# Patient Record
Sex: Male | Born: 1950 | Race: White | Hispanic: No | Marital: Married | Smoking: Never smoker
Health system: Southern US, Community
[De-identification: ages and names within clinical notes are randomized; demographics above are authoritative.]

## PROBLEM LIST (undated history)

## (undated) DIAGNOSIS — M1990 Unspecified osteoarthritis, unspecified site: Secondary | ICD-10-CM

## (undated) DIAGNOSIS — N183 Chronic kidney disease, stage 3 unspecified: Secondary | ICD-10-CM

## (undated) DIAGNOSIS — E785 Hyperlipidemia, unspecified: Secondary | ICD-10-CM

## (undated) DIAGNOSIS — K219 Gastro-esophageal reflux disease without esophagitis: Secondary | ICD-10-CM

## (undated) DIAGNOSIS — T7840XA Allergy, unspecified, initial encounter: Secondary | ICD-10-CM

## (undated) DIAGNOSIS — G40909 Epilepsy, unspecified, not intractable, without status epilepticus: Secondary | ICD-10-CM

## (undated) DIAGNOSIS — K222 Esophageal obstruction: Secondary | ICD-10-CM

## (undated) DIAGNOSIS — R569 Unspecified convulsions: Secondary | ICD-10-CM

## (undated) DIAGNOSIS — I10 Essential (primary) hypertension: Secondary | ICD-10-CM

## (undated) HISTORY — PX: TONSILLECTOMY: SHX5217

## (undated) HISTORY — DX: Epilepsy, unspecified, not intractable, without status epilepticus: G40.909

## (undated) HISTORY — DX: Esophageal obstruction: K22.2

## (undated) HISTORY — PX: APPENDECTOMY: SHX54

## (undated) HISTORY — DX: Chronic kidney disease, stage 3 unspecified: N18.30

## (undated) HISTORY — DX: Unspecified osteoarthritis, unspecified site: M19.90

## (undated) HISTORY — DX: Hyperlipidemia, unspecified: E78.5

## (undated) HISTORY — DX: Allergy, unspecified, initial encounter: T78.40XA

## (undated) HISTORY — DX: Essential (primary) hypertension: I10

## (undated) HISTORY — DX: Gastro-esophageal reflux disease without esophagitis: K21.9

## (undated) HISTORY — PX: UPPER GASTROINTESTINAL ENDOSCOPY: SHX188

---

## 1999-02-11 ENCOUNTER — Encounter: Payer: Self-pay | Admitting: Internal Medicine

## 1999-02-11 ENCOUNTER — Ambulatory Visit (HOSPITAL_COMMUNITY): Admission: RE | Admit: 1999-02-11 | Discharge: 1999-02-11 | Payer: Self-pay | Admitting: Internal Medicine

## 2000-03-08 ENCOUNTER — Encounter: Payer: Self-pay | Admitting: Emergency Medicine

## 2000-03-08 ENCOUNTER — Emergency Department (HOSPITAL_COMMUNITY): Admission: EM | Admit: 2000-03-08 | Discharge: 2000-03-08 | Payer: Self-pay | Admitting: Emergency Medicine

## 2002-08-28 ENCOUNTER — Ambulatory Visit (HOSPITAL_COMMUNITY): Admission: RE | Admit: 2002-08-28 | Discharge: 2002-08-28 | Payer: Self-pay | Admitting: Gastroenterology

## 2003-03-12 ENCOUNTER — Encounter: Payer: Self-pay | Admitting: Emergency Medicine

## 2003-03-12 ENCOUNTER — Emergency Department (HOSPITAL_COMMUNITY): Admission: EM | Admit: 2003-03-12 | Discharge: 2003-03-12 | Payer: Self-pay | Admitting: Emergency Medicine

## 2005-08-10 ENCOUNTER — Encounter: Admission: RE | Admit: 2005-08-10 | Discharge: 2005-08-10 | Payer: Self-pay | Admitting: Internal Medicine

## 2006-07-26 ENCOUNTER — Encounter: Admission: RE | Admit: 2006-07-26 | Discharge: 2006-07-26 | Payer: Self-pay | Admitting: Internal Medicine

## 2006-07-31 ENCOUNTER — Inpatient Hospital Stay (HOSPITAL_COMMUNITY): Admission: EM | Admit: 2006-07-31 | Discharge: 2006-08-03 | Payer: Self-pay | Admitting: Emergency Medicine

## 2006-08-01 ENCOUNTER — Encounter (INDEPENDENT_AMBULATORY_CARE_PROVIDER_SITE_OTHER): Payer: Self-pay | Admitting: Specialist

## 2007-05-04 ENCOUNTER — Emergency Department (HOSPITAL_COMMUNITY): Admission: EM | Admit: 2007-05-04 | Discharge: 2007-05-04 | Payer: Self-pay | Admitting: Emergency Medicine

## 2010-10-25 ENCOUNTER — Encounter: Payer: Self-pay | Admitting: Internal Medicine

## 2011-02-20 NOTE — H&P (Signed)
Wesley Mccarthy, Wesley Mccarthy                 ACCOUNT NO.:  [id number]   MEDICAL RECORD NO.:  [id number]          PATIENT TYPE:  EMS   LOCATION:  MAJO                         FACILITY:  [facility]   PHYSICIAN:  Jonna L. Robb Matar, M.D.DATE OF BIRTH:  1951/07/07   DATE OF ADMISSION:  07/31/2006  DATE OF DISCHARGE:                                HISTORY & PHYSICAL   PRIMARY CARE PHYSICIAN:  Dr. Kellie Shropshire.   CHIEF COMPLAINT:  Weakness.   HISTORY:  Two weeks ago, this African American gentleman came down with some  kind of a virus.  He kept feeling worse and worse.  He was seen in the  office last week where he was diagnosed with pneumonia and started on  Levaquin.  He has finished up the antibiotic but he is just feeling more  weak and washed out.  His appetite has not been good and he has not been  drinking fluids all that well.   PAST HISTORY:  1. Epilepsy.  2. Hypertension.  3. Hypercholesterolemia.  4. Gout.  5. He has occasionally been on steroids.   OPERATIONS:  Appendectomy, tonsillectomy.   FAMILY HISTORY:  Bypass surgery, Alzheimer's, and colon cancer.   SOCIAL HISTORY:  Nonsmoker, nondrinker.  Married.  Three kids; one has  allergies, one has back problems.   ALLERGIES:  PENICILLIN AND SHELLFISH.   MEDICATIONS:  Phenobarbital, Diovan, Levaquin, Lipitor, potassium.  There  may be some kind of gout medicine.  There is some purple pill he takes.  He  nor his wife remember the dosages of any of them.   REVIEW OF SYSTEMS:  He has occasional headaches.  Endocrine, cardiology,  pulmonary, GI, and renal, reviewed and are all completely negative.  The  gout bothers him in his fingers, knees, back, and ankles.  He denies any  previous history of heart, lung, or kidney disease.  No hematuria.  No  abdominal pain.   PHYSICAL EXAMINATION:  VITAL SIGNS:  Temperature 97.0, pulse 88,  respirations 18, blood pressure 111/80, 99% on room air.  GENERAL:  A pleasant elderly rather forgetful  African American gentleman.  HEENT:  Pupils are reactive.  Extraocular movements are full.  NECK:  There is no carotid bruit or thyromegaly or jugular venous  distention.  LUNGS:  Clear until you get to the bases where he has a few faint crackles;  there is no wheezing or rales.  HEART:  Regular rate and rhythm.  Normal S1, S2, without murmurs, rubs, or  gallops.  ABDOMEN:  Nontender including over the renal area.  There is no  hepatosplenomegaly.  EXTREMITIES:  No arthritis.  No cyanosis, clubbing, or edema.   LABORATORY WORK:  White count 8.2, hemoglobin 13.6, BUN 122, creatinine 7.7.  Chest x-ray shows resolving pneumonia versus bibasilar atelectasis.   IMPRESSION:  1. Pneumonia.  I do not think he needs any further antibiotics.  2. Acute renal failure versus dehydration.  I will get an ultrasound of      the kidneys.  Start him on some IV fluids.  Consult Nephrology.  3. Epilepsy.  4.  Hypotension.  Medications will be held.  5. Hypercholesterolemia.  6. Gout.  7. For his regular medications, they are all being held pending finding      out what the dosages are. We will get phenobarbital levels, make sure      that is not elevated.      Jonna L. Robb Matar, M.D.  Electronically Signed     JLB/MEDQ  D:  07/31/2006  T:  08/01/2006  Job:  [id number]

## 2011-02-20 NOTE — Discharge Summary (Signed)
NAMEMIDAS, DAUGHETY NO.:  [id number]   MEDICAL RECORD NO.:  [id number]          PATIENT TYPE:  INP   LOCATION:  4709                         FACILITY:  [facility]   PHYSICIAN:  Hillery Aldo, M.D.   DATE OF BIRTH:  1951/08/10   DATE OF ADMISSION:  07/31/2006  DATE OF DISCHARGE:  08/03/2006                                 DISCHARGE SUMMARY   PRIMARY CARE PHYSICIAN:  Dr. Andi Devon.   DISCHARGE DIAGNOSES:  1. Acute renal failure likely secondary to acute tubular necrosis from      volume contraction in the presence of ACE inhibitor and angiotensin      receptor blocker medications and possible AIN due to allergic reaction      to Levaquin.  2. Recent treatment for pneumonia with a 10-day course of Levaquin.  3. Seizure disorder.  4. Hyperlipidemia.  5. Hypertension.  6. Hypokalemia.  7. Gouty arthritis.   DISCHARGE MEDICATIONS:  1. Lipitor 20 mg daily.  2. Phenobarbital 97.2 mg daily.  3  Potassium chloride 20 mEq b.i.d.  1. Hydrocodone 12.5/2/7.5 p.o. q.6 h p.r.n. cough.   Note:  The patient was instructed to hold Diovan HCT, amlodipine/benazepril,  indomethacin ER and colchicine until he follows up with his primary care  physician, Dr. Renae Gloss.   CONSULTATIONS:  1. Dr. Arlean Hopping of Nephrology.   PROCEDURES AND DIAGNOSTIC STUDIES:  1. Chest x-ray on July 31, 2006, showed resolving bibasilar ammonia      with minimal residual atelectasis.  2. Renal ultrasound on July 31, 2006, showed echogenic kidneys that      were normal-sized.  This would be compatible with medical renal      disease.  No hydronephrosis.   DISCHARGE LABORATORY VALUES:  Sodium was 141, potassium 3.2 (repleted prior  to discharge), chloride 109, bicarbonate 23, glucose 109, BUN 51, creatinine  2.8.  Urine culture was negative.   BRIEF ADMISSION HISTORY OF PRESENT ILLNESS:  The patient is a 60 year old  male who was treated with a 10-day course of Levaquin secondary to  pneumonia.  After he finished the antibiotic, he felt weak and had  diminished appetite.  He was somewhat dehydrated and not taking n.p.o.  fluids well.  He presented to the hospital with a chief complaint of  weakness, and upon initial evaluation was found to be an acute renal failure  with a creatinine of 7.7.   HOSPITAL COURSE:  #1 - ACUTE RENAL FAILURE.  The patient has been an initial  creatinine of 7.7 and an initial BUN of 0.22.  Renal consultation was  therefore requested, and it was felt that acute renal failure was secondary  to volume depletion in the setting of angiotensin receptive blocker and ACE  inhibitor medications.  The second possible explanation would be acute  interstitial nephritis secondary to use of quinolones.  Urine was sent for  eosinophils.  However at the time of this dictation, this is not back yet.  Microscopic on his urine did reveal 7-10 white blood cells and 21-50 red  blood cells with no significant bacteria.  His ACE inhibitor and his ARB were both held, and the patient was put on  fluid volume resuscitation.  His renal function has continued to improve  with a creatinine that went from 7.7-5.6 to 3.8 and today is 2.9.  It is  expected that his renal function will return to baseline of the next 24-48  hours.  Given this, he is stable for discharge.  He will follow up with his  primary care physician, Dr. Renae Gloss, for a check of his electrolytes and  renal function on August 05, 2006.   #2 - RECENT HISTORY OF PNEUMONIA.  The patient was treated with 10-day  course of Levaquin.  His follow-up chest x-ray does show resolution.  He has  been afebrile with a normal white blood cell count and no significant cough  or other symptoms related to pneumonia.   #3 - SEIZURE DISORDER.  The patient was maintained on phenobarbital for  seizure control.  He had exhibited no seizure activity while in the  hospital.  Phenobarbital level was checked during course of  his  hospitalization and was found to be in the therapeutic range at 19.2.   #4 - HYPERLIPIDEMIA.  The patient was maintained on Statin therapy.  This  was restarted during the course of his hospitalization, and he is instructed  to continue this at discharge.   #5 - HISTORY OF GOUT.  The patient did not have any gout flares during this  hospitalization.  Nevertheless, it is recommended he discontinue colchicine  and indomethacin at this time.  When his renal function returns normal, he  can restart these medications.   #6 - HYPERTENSION.  The patient's hypertension has been controlled even  though he has not been taking any antihypertensive medication.  Again, he is  instructed to hold his antihypertensives until he follows up with Dr.  Renae Gloss and has a recheck of his renal function.   #7 - HYPOKALEMIA.  The patient was hypokalemic during the course of  hospitalization.  He was repleted with potassium and will be instructed to  resume his normal potassium supplementation doses of discharge.   DISPOSITION:  The patient is discharged home.  He has a follow-up  appointment scheduled with Dr. Renae Gloss on August 05, 2006 at the 11:00 a.m.  At that time, his renal function will be rechecked as well as his  electrolytes.      Hillery Aldo, M.D.  Electronically Signed     CR/MEDQ  D:  08/03/2006  T:  08/03/2006  Job:  [id number]   cc:   Merlene Laughter. Renae Gloss, M.D.

## 2011-02-20 NOTE — Op Note (Signed)
   Wesley Mccarthy, Wesley Mccarthy                             ACCOUNT NO.:  [id number]   MEDICAL RECORD NO.:  [id number]                   PATIENT TYPE:  AMB   LOCATION:  ENDO                                 FACILITY:  [facility]   PHYSICIAN:  Anselmo Rod, M.D.               DATE OF BIRTH:  17-May-1951   DATE OF PROCEDURE:  08/28/2002  DATE OF DISCHARGE:                                 OPERATIVE REPORT   PROCEDURE:  Colonoscopy.   ENDOSCOPIST:  Anselmo Rod, M.D.   INSTRUMENT USED:  Olympus video colonoscope.   INDICATION FOR PROCEDURE:  A 60 year old African-American male with a  history of blood in stool and a family history of colon cancer in his  father.  Rule out colonic polyps, masses, hemorrhoids, etc.   PREPROCEDURE PREPARATION:  Informed consent was procured from the patient.  The patient was fasted for eight hours prior to the procedure and prepped  with a bottle of magnesium citrate and a gallon of NuLytely the night prior  to the procedure.   PREPROCEDURE PHYSICAL:  VITAL SIGNS:  The patient had stable vital signs.  NECK:  Supple.  CHEST:  Clear to auscultation.  S1, S2 regular.  ABDOMEN:  Soft with normal bowel sounds.   DESCRIPTION OF PROCEDURE:  The patient was placed in the left lateral  decubitus position and sedated with 70 mg of Demerol and 7 mg of Versed  intravenously.  Once the patient was adequately sedate and maintained on low-  flow oxygen and continuous cardiac monitoring, the Olympus video colonoscope  was advanced from the rectum to the cecum without difficulty.  The patient  had an excellent prep.  No masses, polyps, erosions, ulcerations, or  diverticula were seen.  Small internal hemorrhoids were appreciated on  retroflexion.  The entire colonic mucosa up to the cecum appeared normal.  The appendiceal orifice and the ileocecal valve were clearly visualized and  photographed.   IMPRESSION:  Normal colonoscopy up to the cecum except for small,  nonbleeding  internal hemorrhoids.   RECOMMENDATIONS:  1. Repeat guaiac testing on an outpatient basis.  2.     Repeat colorectal cancer screening in the next five years unless the patient     develops any abnormal symptoms in the interim.  3. A high-fiber diet.                                               Anselmo Rod, M.D.    JNM/MEDQ  D:  08/28/2002  T:  08/29/2002  Job:  [id number]   cc:   Merlene Laughter. Renae Gloss, M.D.  [phone] [address]  [place]  [place] [address]  Fax: [phone]

## 2011-02-20 NOTE — Consult Note (Signed)
NAMEBROOKES, Wesley Mccarthy NO.:  [id number]   MEDICAL RECORD NO.:  [id number]          PATIENT TYPE:  INP   LOCATION:  4709                         FACILITY:  [facility]   PHYSICIAN:  Maree Krabbe, M.D.DATE OF BIRTH:  10/04/1951   DATE OF CONSULTATION:  07/31/2006  DATE OF DISCHARGE:                                   CONSULTATION   HISTORY:  The patient is a 60 year old African American male with  longstanding controlled epilepsy since childhood, hypertension, and  otherwise healthy.  He had a 1-week upper respiratory illness with bibasilar  pulmonary infiltrates on chest x-ray, treated as pneumonia with antibiotics  which was Levaquin.  He also felt he might have had the flu.  He had fevers,  myalgias, cough, and anorexia.  He eventually improved but then was found to  have significant lab abnormalities, with a BUN of 122 and creatinine of 7.7,  and he was admitted on July 31, 2006 with these problems.  He has noticed  a drop in the urine output but otherwise no previous problems with  urination.  No voiding difficulty, no symptoms of prostatism.  No use of  over-the-counter analgesics, NSAIDs.  He does take an angiotensin receptor  blocker which is Diovan.   PAST MEDICAL HISTORY:  1. Hypertension.  2. Epilepsy.  3. Hyperlipidemia.  4. Gout.   SOCIAL HISTORY:  Married.  Three children.   MEDICATIONS:  Phenobarbital, Diovan, Levaquin, Lipitor, and potassium.   SOCIAL HISTORY:  No alcohol or tobacco use.   REVIEW OF SYSTEMS:  GENERAL:  Denies fevers, chills, sweats.  ENT:  No  hearing loss, visual change.  CARDIORESPIRATORY:  No chest pain, shortness  of breath, or cough.  These have resolved.  GI:  Currently eating  marginally, but eating.  No abdominal pain or diarrhea. GU:  No difficulty  voiding; otherwise, as above.  MUSCULOSKELETAL:  No myalgias or arthralgias.  NEUROLOGIC:  No focal numbness or weakness.   PHYSICAL EXAMINATION:  VITAL SIGNS:  Blood  pressure 120/86, pulse 70,  respirations 18, temperature 97.9.  SKIN:  No rash.  HEENT:  Extraocular muscles intact.  Throat is clear.  NECK:  Supple without JVD.  CHEST:  Clear throughout.  CARDIAC:  Regular rate and rhythm without murmurs, rubs, or gallops.  ABDOMEN:  Soft, nontender.  Active bowel sounds.  EXTREMITIES:  No peripheral edema.  NEUROLOGIC:  Alert and oriented x3.   LABORATORY DATA:  White blood count 8000, hemoglobin 13.  BUN 122,  creatinine 7.7, potassium 4.4, calcium 9.6, magnesium 2.5, phosphorous 6.1.  Urinalysis with 11-20 white blood cells, 3-6 red blood cells.   Chest x-ray on July 31, 2006 shows resolution of the previous bibasilar  infiltrates versus atelectasis.   IMPRESSION:  1. Acute renal failure due to volume depletion and angiotensin receptor      blocker effect.  Less likely, interstitial nephritis due to quinolones.  2. Hypertension.  3. Epilepsy.  4. Pyuria.   RECOMMENDATIONS:  1. IV fluids.  2. Hold ARB (Diovan).  3. Urine eosinophils and urine culture.  Maree Krabbe, M.D.  Electronically Signed     RDS/MEDQ  D:  08/01/2006  T:  08/02/2006  Job:  [id number]

## 2011-08-11 ENCOUNTER — Emergency Department (HOSPITAL_COMMUNITY)
Admission: EM | Admit: 2011-08-11 | Discharge: 2011-08-12 | Disposition: A | Discharge status: Home / Self Care | Payer: No Typology Code available for payment source | Attending: Emergency Medicine | Admitting: Emergency Medicine

## 2011-08-11 ENCOUNTER — Encounter: Payer: Self-pay | Admitting: *Deleted

## 2011-08-11 DIAGNOSIS — Z79899 Other long term (current) drug therapy: Secondary | ICD-10-CM | POA: Insufficient documentation

## 2011-08-11 DIAGNOSIS — M542 Cervicalgia: Secondary | ICD-10-CM | POA: Insufficient documentation

## 2011-08-11 DIAGNOSIS — Z7982 Long term (current) use of aspirin: Secondary | ICD-10-CM | POA: Insufficient documentation

## 2011-08-11 DIAGNOSIS — S139XXA Sprain of joints and ligaments of unspecified parts of neck, initial encounter: Secondary | ICD-10-CM | POA: Insufficient documentation

## 2011-08-11 DIAGNOSIS — S161XXA Strain of muscle, fascia and tendon at neck level, initial encounter: Secondary | ICD-10-CM

## 2011-08-11 HISTORY — DX: Unspecified convulsions: R56.9
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
## 2011-08-12 ENCOUNTER — Encounter (HOSPITAL_COMMUNITY): Payer: Self-pay | Admitting: Emergency Medicine

## 2011-08-12 ENCOUNTER — Emergency Department (HOSPITAL_COMMUNITY): Payer: No Typology Code available for payment source

## 2011-08-12 MED ORDER — IBUPROFEN 800 MG PO TABS
800.0000 mg | ORAL_TABLET | Freq: Once | ORAL | Status: AC
  Administered 2011-08-12: 800 mg via ORAL
  Filled 2011-08-12: qty 1

## 2011-08-12 MED ORDER — NAPROXEN 500 MG PO TABS: 500.0000 mg | ORAL_TABLET | Freq: Two times a day (BID) | ORAL | Status: DC

## 2011-08-12 MED ORDER — OXYCODONE-ACETAMINOPHEN 5-325 MG PO TABS
2.0000 | ORAL_TABLET | Freq: Once | ORAL | Status: AC
  Administered 2011-08-12: 2 via ORAL
  Filled 2011-08-12: qty 2

## 2011-08-12 MED ORDER — HYDROCODONE-ACETAMINOPHEN 5-500 MG PO TABS: 1.0000 | ORAL_TABLET | Freq: Four times a day (QID) | ORAL | Status: AC | PRN

## 2011-08-12 MED ORDER — CYCLOBENZAPRINE HCL 10 MG PO TABS: 10.0000 mg | ORAL_TABLET | Freq: Two times a day (BID) | ORAL | Status: AC | PRN
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
## 2011-08-13 ENCOUNTER — Encounter (HOSPITAL_COMMUNITY): Payer: Self-pay | Admitting: Emergency Medicine

## 2011-11-02 ENCOUNTER — Ambulatory Visit (INDEPENDENT_AMBULATORY_CARE_PROVIDER_SITE_OTHER): Payer: Managed Care, Other (non HMO)

## 2011-11-02 DIAGNOSIS — R569 Unspecified convulsions: Secondary | ICD-10-CM

## 2011-11-02 DIAGNOSIS — I10 Essential (primary) hypertension: Secondary | ICD-10-CM

## 2011-11-02 DIAGNOSIS — J019 Acute sinusitis, unspecified: Secondary | ICD-10-CM

## 2011-11-09 ENCOUNTER — Other Ambulatory Visit: Payer: Self-pay | Admitting: Internal Medicine

## 2011-11-09 MED ORDER — PHENOBARBITAL 97.2 MG PO TABS: 97.2000 mg | ORAL_TABLET | Freq: Every day | ORAL | Status: DC

## 2011-11-11 ENCOUNTER — Encounter: Payer: Self-pay | Admitting: Family Medicine

## 2011-11-11 ENCOUNTER — Ambulatory Visit (INDEPENDENT_AMBULATORY_CARE_PROVIDER_SITE_OTHER): Payer: Managed Care, Other (non HMO) | Admitting: Family Medicine

## 2011-11-11 DIAGNOSIS — E669 Obesity, unspecified: Secondary | ICD-10-CM

## 2011-11-11 DIAGNOSIS — E785 Hyperlipidemia, unspecified: Secondary | ICD-10-CM | POA: Insufficient documentation

## 2011-11-11 DIAGNOSIS — M1A9XX0 Chronic gout, unspecified, without tophus (tophi): Secondary | ICD-10-CM | POA: Insufficient documentation

## 2011-11-11 DIAGNOSIS — I152 Hypertension secondary to endocrine disorders: Secondary | ICD-10-CM | POA: Insufficient documentation

## 2011-11-11 DIAGNOSIS — Z0000 Encounter for general adult medical examination without abnormal findings: Secondary | ICD-10-CM | POA: Insufficient documentation

## 2011-11-11 DIAGNOSIS — I10 Essential (primary) hypertension: Secondary | ICD-10-CM | POA: Insufficient documentation

## 2011-11-11 MED ORDER — PRAVASTATIN SODIUM 40 MG PO TABS: 40.0000 mg | ORAL_TABLET | Freq: Every day | ORAL | Status: DC

## 2011-11-11 MED ORDER — ALLOPURINOL 300 MG PO TABS: 300.0000 mg | ORAL_TABLET | Freq: Every day | ORAL | Status: DC

## 2011-11-11 MED ORDER — PHENOBARBITAL 97.2 MG PO TABS: 97.2000 mg | ORAL_TABLET | Freq: Every day | ORAL | Status: DC

## 2011-11-11 MED ORDER — NAPROXEN 500 MG PO TABS: 500.0000 mg | ORAL_TABLET | Freq: Two times a day (BID) | ORAL | Status: DC

## 2011-11-11 MED ORDER — LISINOPRIL 40 MG PO TABS: 40.0000 mg | ORAL_TABLET | Freq: Every day | ORAL | Status: DC

## 2011-11-11 MED ORDER — POTASSIUM CHLORIDE 20 MEQ PO PACK: 20.0000 meq | PACK | Freq: Two times a day (BID) | ORAL | Status: DC

## 2011-11-11 MED ORDER — HYDROCHLOROTHIAZIDE 12.5 MG PO TABS: 12.5000 mg | ORAL_TABLET | Freq: Every day | ORAL | Status: DC

## 2011-11-11 NOTE — Patient Instructions (Signed)
Obesity Obesity is defined as having a body mass index (BMI) of 30 or more. To calculate your BMI divide your weight in pounds by your height in inches squared and multiply that product by 703. Major illnesses resulting from long-term obesity include:  Stroke.   Heart disease.   Diabetes.   Many cancers.   Arthritis.  Obesity also complicates recovery from many other medical problems.  CAUSES   A history of obesity in your parents.   Thyroid hormone imbalance.   Environmental factors such as excess calorie intake and physical inactivity.  TREATMENT  A healthy weight loss program includes:  A calorie restricted diet based on individual calorie needs.   Increased physical activity (exercise).  An exercise program is just as important as the right low-calorie diet.  Weight-loss medicines should be used only under the supervision of your physician. These medicines help, but only if they are used with diet and exercise programs. Medicines can have side effects including nervousness, nausea, abdominal pain, diarrhea, headache, drowsiness, and depression.  An unhealthy weight loss program includes:  Fasting.   Fad diets.   Supplements and drugs.  These choices do not succeed in long-term weight control.  HOME CARE INSTRUCTIONS  To help you make the needed dietary changes:   Exercise and perform physical activity as directed by your caregiver.   Keep a daily record of everything you eat. There are many free websites to help you with this. It may be helpful to measure your foods so you can determine if you are eating the correct portion sizes.   Use low-calorie cookbooks or take special cooking classes.   Avoid alcohol. Drink more water and drinks with no calories.   Take vitamins and supplements only as recommended by your caregiver.   Weight loss support groups, Registered Dieticians, counselors, and stress reduction education can also be very helpful.  Document Released:  10/29/2004 Document Revised: 06/03/2011 Document Reviewed: 08/28/2007 [employer] Patient Information 2012 [employer]. 

## 2011-11-11 NOTE — Progress Notes (Signed)
  Subjective:    Patient ID: Wesley Mccarthy, male    DOB: 11-Aug-1951, 61 y.o.   MRN: [id number]  HPI  This 61 y.o. AA male is here for HTN follow-up. He states compliance with medications; However, he lost the RXs given to him last week by another provider at [facility]. He will finish the antibiotic   today or tomorrow. He is requesting RFs on all chronic meds.    Review of Systems  Constitutional: Negative.   Eyes: Negative for visual disturbance.  Respiratory: Negative for cough, chest tightness and shortness of breath.   Cardiovascular: Negative.   Neurological: Negative.        Objective:   Physical Exam  Vitals reviewed. Constitutional: He is oriented to person, place, and time. He appears well-developed and well-nourished. No distress.  HENT:  Head: Normocephalic and atraumatic.  Cardiovascular: Normal rate and regular rhythm.   Pulmonary/Chest: Effort normal.  Neurological: He is alert and oriented to person, place, and time. No cranial nerve deficit.  Psychiatric: He has a normal mood and affect.           Assessment & Plan:   1. HTN (hypertension)  Controlled; cont. current medications. Encouraged healthier diet and regular exercise to promote weight reduction.  2. Hyperlipemia  Schedule to RTC for fasting lipid profile and ALT.Cont. Pravastatin.  3. Gout  Continue Allopurinol. Observe dietary restrictions.  4. Obesity  Weight loss goal- 20 lbs over next 4 months.

## 2012-02-02 ENCOUNTER — Ambulatory Visit: Payer: Managed Care, Other (non HMO)

## 2012-02-02 ENCOUNTER — Ambulatory Visit (INDEPENDENT_AMBULATORY_CARE_PROVIDER_SITE_OTHER): Payer: Managed Care, Other (non HMO) | Admitting: Family Medicine

## 2012-02-02 VITALS — BP 145/80 | HR 71 | Temp 98.3°F | Resp 16 | Ht 69.0 in | Wt 219.0 lb

## 2012-02-02 DIAGNOSIS — M79609 Pain in unspecified limb: Secondary | ICD-10-CM

## 2012-02-02 DIAGNOSIS — M79643 Pain in unspecified hand: Secondary | ICD-10-CM

## 2012-02-02 MED ORDER — DOXYCYCLINE HYCLATE 100 MG PO TABS: 100.0000 mg | ORAL_TABLET | Freq: Two times a day (BID) | ORAL | Status: AC

## 2012-02-02 NOTE — Progress Notes (Signed)
  Subjective:    Patient ID: Wesley Mccarthy, male    DOB: 17-Mar-1951, 61 y.o.   MRN: [id number]  HPI 61 yo male with concern for foreign body in his hand.  Works night shift at [employer].  Doesn't remember any trouble with hand when he went to work.  Overnight started to feel pain in left at base of thumb.  Noticed splinter like object in hand.  Doesn't remember when splinter went in or what it could be.  Co-worker tried to get it out with needle.  Got something out but he didn't look at it.  Still feels like something could be stuck in there.  Does have thickening/nodular area in palm near that spot, but that has been there.    Review of Systems Negative except as per HPI     Objective:   Physical Exam  Constitutional: He appears well-developed.  Pulmonary/Chest: Effort normal.  Neurological: He is alert.  Skin:       Left hand, between MCP and thenar crease, visible area where needle used to try to extract fb.  No fb clearly visible given blood/bruise from previous attempt.  Not able to appreciate by palpation either.  Just distal, towards fingers, nodular thickening in palm.  Nontender.       [facility] Primary radiology reading by Dr. Georgiana Shore: No foreign body identified    Assessment & Plan:  Hand pain, possible foreign body - antibiotic to prevent infection (doxy given penicillin allergy).  Discussed how body will make sure any remaining splinter works out.  Prefer not to incise and explore given location of palm of hand and near thumb and important structures as well as the thickening and possible cysts.

## 2012-02-04 ENCOUNTER — Ambulatory Visit (INDEPENDENT_AMBULATORY_CARE_PROVIDER_SITE_OTHER): Payer: Managed Care, Other (non HMO) | Admitting: Family Medicine

## 2012-02-04 DIAGNOSIS — J309 Allergic rhinitis, unspecified: Secondary | ICD-10-CM

## 2012-02-04 MED ORDER — LORATADINE 10 MG PO TABS: 10.0000 mg | ORAL_TABLET | Freq: Every day | ORAL | Status: DC

## 2012-02-04 NOTE — Progress Notes (Signed)
  Subjective:    Patient ID: Wesley Mccarthy, male    DOB: [birth year], 61 y.o.   MRN: [id number]  HPI 61 yo male with multiple medical problems, just here 2 days ago for possible foreign body in hand, here today with URI symptoms. Sneezing, runny nose, cough.  Started through the day today.  Hasn't taken anything for it.  No hsitory of allergies.  No fever, sore throat, or ear pain.     Review of Systems Negative except as per HPI     Objective:   Physical Exam  Constitutional: He appears well-developed. No distress.  HENT:  Right Ear: Tympanic membrane, external ear and ear canal normal. Tympanic membrane is not injected, not scarred, not perforated, not erythematous, not retracted and not bulging.  Left Ear: Tympanic membrane, external ear and ear canal normal. Tympanic membrane is not injected, not scarred, not perforated, not erythematous, not retracted and not bulging.  Nose: No mucosal edema or rhinorrhea. Right sinus exhibits no maxillary sinus tenderness and no frontal sinus tenderness. Left sinus exhibits no maxillary sinus tenderness and no frontal sinus tenderness.  Mouth/Throat: Uvula is midline, oropharynx is clear and moist and mucous membranes are normal. No oropharyngeal exudate or tonsillar abscesses.  Cardiovascular: Normal rate, regular rhythm, normal heart sounds and intact distal pulses.   No murmur heard. Pulmonary/Chest: Effort normal and breath sounds normal. No respiratory distress. He has no wheezes. He has no rales.  Lymphadenopathy:       Head (right side): No submandibular and no preauricular adenopathy present.       Head (left side): No submandibular and no preauricular adenopathy present.       Right cervical: No superficial cervical and no posterior cervical adenopathy present.      Left cervical: No superficial cervical and no posterior cervical adenopathy present.       Right: No supraclavicular adenopathy present.       Left: No supraclavicular adenopathy  present.  Skin: Skin is warm and dry.          Assessment & Plan:  Likely allergies - states he has prescription nose spray at home.  Will restart it.  If runs out, can call with name for refill.  Also, start claritin, fluids, rest.

## 2012-02-11 ENCOUNTER — Ambulatory Visit (INDEPENDENT_AMBULATORY_CARE_PROVIDER_SITE_OTHER): Payer: Managed Care, Other (non HMO) | Admitting: Family Medicine

## 2012-02-11 VITALS — BP 145/80 | HR 93 | Temp 98.6°F | Resp 16 | Ht 68.5 in | Wt 221.0 lb

## 2012-02-11 DIAGNOSIS — R058 Other specified cough: Secondary | ICD-10-CM

## 2012-02-11 DIAGNOSIS — R059 Cough, unspecified: Secondary | ICD-10-CM

## 2012-02-11 DIAGNOSIS — J302 Other seasonal allergic rhinitis: Secondary | ICD-10-CM

## 2012-02-11 DIAGNOSIS — J310 Chronic rhinitis: Secondary | ICD-10-CM

## 2012-02-11 DIAGNOSIS — J309 Allergic rhinitis, unspecified: Secondary | ICD-10-CM

## 2012-02-11 DIAGNOSIS — R05 Cough: Secondary | ICD-10-CM

## 2012-02-11 LAB — POCT CBC
Granulocyte percent: 68.4 %G (ref 37–80)
HCT, POC: 39.7 % — AB (ref 43.5–53.7)
Hemoglobin: 12.9 g/dL — AB (ref 14.1–18.1)
MCH, POC: 29.8 pg (ref 27–31.2)
MCV: 91.7 fL (ref 80–97)
RBC: 4.33 M/uL — AB (ref 4.69–6.13)
WBC: 6.4 10*3/uL (ref 4.6–10.2)

## 2012-02-11 MED ORDER — BENZONATATE 100 MG PO CAPS: ORAL_CAPSULE | ORAL | Status: DC

## 2012-02-11 MED ORDER — FLUTICASONE PROPIONATE 50 MCG/ACT NA SUSP: 2.0000 | Freq: Every day | NASAL | Status: DC

## 2012-02-11 MED ORDER — METHYLPREDNISOLONE ACETATE 80 MG/ML IJ SUSP
80.0000 mg | Freq: Once | INTRAMUSCULAR | Status: AC
  Administered 2012-02-11: 80 mg via INTRAMUSCULAR

## 2012-02-11 NOTE — Progress Notes (Signed)
Subjective: Patient is here with history of having his symptoms continue to persist. He was here twice a little week ago. He was treated with Omnicef and doxycycline. He is continued to have a lot of head congestion and blowing mostly clear mucus from his nose. He has been coughing a lot but not wheezing. Has had some chills at times. He is not and he just does not feel good though he has been able to continue to keep working.   Objective: Target looking man who just appears not Fiorinal well. His TMs are normal. Nose looks clear. Throat clear without any erythema. Neck supple without significant nodes. Chest is clear to auscultation. Heart regular without murmurs.   Assessment: URI Cough Possible allergies  Plan check a CBC and labs on him and then decide where we proceed.  Results for orders placed in visit on 02/11/12  POCT CBC      Component Value Range   WBC 6.4  4.6 - 10.2 (K/uL)   Lymph, poc 1.1  0.6 - 3.4    POC LYMPH PERCENT 17.6  10 - 50 (%L)   MID (cbc) 0.9  0 - 0.9    POC MID % 14.0 (*) 0 - 12 (%M)   POC Granulocyte 4.4  2 - 6.9    Granulocyte percent 68.4  37 - 80 (%G)   RBC 4.33 (*) 4.69 - 6.13 (M/uL)   Hemoglobin 12.9 (*) 14.1 - 18.1 (g/dL)   HCT, POC 16.1 (*) 09.6 - 53.7 (%)   MCV 91.7  80 - 97 (fL)   MCH, POC 29.8  27 - 31.2 (pg)   MCHC 32.5  31.8 - 35.4 (g/dL)   RDW, POC 04.5     Platelet Count, POC 249  142 - 424 (K/uL)   MPV 7.3  0 - 99.8 (fL)  GLUCOSE, POCT (MANUAL RESULT ENTRY)      Component Value Range   POC Glucose 96     I do not think this is an active infectious process at this time. We'll go ahead and give her a shot of steroids and treat with nasal steroids and cough medication.

## 2012-02-11 NOTE — Patient Instructions (Addendum)
If you have any of the antibiotics left, please finish the course of them.  Use the nose spray, Flonase, 2 sprays each nostril twice daily for 3 days, then just once daily. Continue until you're doing a lot better.  Take a cough pills 3 times a day as needed. If you are still having trouble with coughing at nighttime takes some Delsym which you can buy without a prescription at bedtime  Continue taking the Claritin daily

## 2012-02-17 ENCOUNTER — Ambulatory Visit: Payer: Managed Care, Other (non HMO)

## 2012-02-17 ENCOUNTER — Ambulatory Visit (INDEPENDENT_AMBULATORY_CARE_PROVIDER_SITE_OTHER): Payer: Managed Care, Other (non HMO) | Admitting: Family Medicine

## 2012-02-17 VITALS — BP 143/86 | HR 69 | Temp 98.1°F | Resp 16 | Ht 68.5 in | Wt 211.4 lb

## 2012-02-17 DIAGNOSIS — R899 Unspecified abnormal finding in specimens from other organs, systems and tissues: Secondary | ICD-10-CM

## 2012-02-17 DIAGNOSIS — G40909 Epilepsy, unspecified, not intractable, without status epilepticus: Secondary | ICD-10-CM

## 2012-02-17 DIAGNOSIS — R05 Cough: Secondary | ICD-10-CM

## 2012-02-17 DIAGNOSIS — R5383 Other fatigue: Secondary | ICD-10-CM

## 2012-02-17 DIAGNOSIS — R5381 Other malaise: Secondary | ICD-10-CM

## 2012-02-17 LAB — COMPREHENSIVE METABOLIC PANEL
ALT: 23 U/L (ref 0–53)
Albumin: 3.8 g/dL (ref 3.5–5.2)
Alkaline Phosphatase: 151 U/L — ABNORMAL HIGH (ref 39–117)
CO2: 28 mEq/L (ref 19–32)
Glucose, Bld: 94 mg/dL (ref 70–99)
Potassium: 3.5 mEq/L (ref 3.5–5.3)
Sodium: 140 mEq/L (ref 135–145)
Total Protein: 6.8 g/dL (ref 6.0–8.3)

## 2012-02-17 LAB — POCT CBC
Granulocyte percent: 53.2 %G (ref 37–80)
HCT, POC: 41 % — AB (ref 43.5–53.7)
Lymph, poc: 1.7 (ref 0.6–3.4)
MCV: 91.5 fL (ref 80–97)
MID (cbc): 0.8 (ref 0–0.9)
POC LYMPH PERCENT: 31.7 %L (ref 10–50)
POC MID %: 15.1 %M — AB (ref 0–12)
RBC: 4.48 M/uL — AB (ref 4.69–6.13)
WBC: 5.3 10*3/uL (ref 4.6–10.2)

## 2012-02-17 MED ORDER — BENZONATATE 100 MG PO CAPS: ORAL_CAPSULE | ORAL | Status: AC

## 2012-02-17 NOTE — Progress Notes (Signed)
  Patient Name: Wesley Mccarthy Date of Birth: 1950-11-29 Medical Record Number: [id number] Gender: male Date of Encounter: 02/17/2012  History of Present Illness:  Wesley Mccarthy is a 61 y.o. very pleasant male patient who presents with the following:  Here with persistent cough. See last 2 OV 5/9 and 02/04/12- so far he has been treated with omnicef and depo- medrol. Cough is dry.  He feels "like I don't have any energy."  No ST, no runny nose.   Not coughing up blood.  Cough is not keeping him awake at night.   No fever or chills anymore.   No GI symptoms.   Otherwise he is doing ok and denies anything else bothering him- he looks sad today but says he is just tired out.    Patient Active Problem List  Diagnoses  . HTN (hypertension)  . Hyperlipemia  . Gout  . Obesity  . Health care maintenance   Past Medical History  Diagnosis Date  . Seizures    No past surgical history on file. History  Substance Use Topics  . Smoking status: Never Smoker   . Smokeless tobacco: Not on file  . Alcohol Use: No   Family History  Problem Relation Age of Onset  . Heart disease Mother     Had a CABG   Allergies  Allergen Reactions  . 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  Marland Kitchen Penicillins     Medication list has been reviewed and updated.  Review of Systems: As per HPI- otherwise negative.  Physical Examination: Filed Vitals:   02/17/12 1237  BP: 143/86  Pulse: 69  Temp: 98.1 F (36.7 C)  TempSrc: Oral  Resp: 16  Height: 5' 8.5" (1.74 m)  Weight: 211 lb 6.4 oz (95.89 kg)    Body mass index is 31.68 kg/(m^2).  GEN: WDWN, NAD, Non-toxic, A & O x 3, obese HEENT: Atraumatic, Normocephalic. Neck supple. No masses, No LAD. Tm and oropharynx wnl Ears and Nose: No external deformity. CV: RRR, No M/G/R. No JVD. No thrill. No extra heart sounds. PULM: CTA B, no wheezes, crackles, rhonchi. No retractions. No resp. distress. No accessory muscle  use. ABD: S, NT, ND, +BS. No rebound. No HSM. EXTR: No c/c/e NEURO Normal gait.  PSYCH: flat affect.  Conversant. Not anxious appearing.  Calm demeanor.   [facility] reading (PRIMARY) by  Dr. Patsy Lager.  Poor inspiration.  Enlarged heart.  No definite infiltrate  CHEST - 2 VIEW  Comparison: None.  Findings: Mild cardiomegaly. Lungs are clear. No effusions or acute bony abnormality. Degenerative changes in the thoracic spine.  IMPRESSION: Cardiomegaly. No active disease.   Assessment and Plan: 1. Cough  DG Chest 2 View, POCT CBC  2. Cough with sputum  benzonatate (TESSALON) 100 MG capsule  3. Fatigue  POCT CBC, TSH, Comprehensive metabolic panel, Phenobarbital level, Brain natriuretic peptide   Will await labs as above.  It seems that [name] is mostly bothered by the fact that his cough is not gone yet.  Explained that a cough can last for several weeks after an illness.  Will be in touch with overread of CXR and labs.  He will let me know if he gets worse in the meantime

## 2012-02-18 LAB — PHENOBARBITAL LEVEL: Phenobarbital: 14 ug/mL — ABNORMAL LOW (ref 15.0–40.0)

## 2012-02-18 LAB — BRAIN NATRIURETIC PEPTIDE: Brain Natriuretic Peptide: 21 pg/mL (ref 0.0–100.0)

## 2012-02-19 ENCOUNTER — Telehealth: Payer: Self-pay

## 2012-02-19 ENCOUNTER — Encounter: Payer: Self-pay | Admitting: Family Medicine

## 2012-02-19 NOTE — Progress Notes (Signed)
Addended by: Abbe Amsterdam C on: 02/19/2012 09:19 PM   Modules accepted: Orders

## 2012-02-19 NOTE — Telephone Encounter (Signed)
Called Wesley Mccarthy and gave him the normal results of his chest xray and also BNP/test for CHF. Told him that Dr Patsy Lager plans to call him back later after finishing w/pts to discuss all results further.

## 2012-02-19 NOTE — Telephone Encounter (Signed)
PATIENT CONTINUES TO HAVE A VERY BAD COUGH THAT HAS NOT BEEN HELPED BY THE MEDICINE.  ALSO WOULD LIKE TO KNOW THE LAB RESULTS FROM PREVIOUS VISIT

## 2012-02-19 NOTE — Progress Notes (Signed)
Addended by: Abbe Amsterdam C on: 02/19/2012 09:26 PM   Modules accepted: Orders

## 2012-03-09 ENCOUNTER — Ambulatory Visit: Payer: Managed Care, Other (non HMO) | Admitting: Family Medicine

## 2012-04-21 ENCOUNTER — Other Ambulatory Visit: Payer: Self-pay | Admitting: Family Medicine

## 2012-04-27 ENCOUNTER — Other Ambulatory Visit: Payer: Self-pay | Admitting: Physician Assistant

## 2012-04-27 MED ORDER — PHENOBARBITAL 97.2 MG PO TABS: 97.2000 mg | ORAL_TABLET | Freq: Every day | ORAL | Status: DC

## 2012-05-28 ENCOUNTER — Other Ambulatory Visit: Payer: Self-pay | Admitting: Family Medicine

## 2012-05-29 NOTE — Telephone Encounter (Signed)
Advise patient that he needs lab for additional fills.  Future order was placed by Dr. Patsy Lager in 02/2012.

## 2012-05-30 NOTE — Telephone Encounter (Signed)
Patient notified

## 2012-06-08 ENCOUNTER — Ambulatory Visit: Payer: Managed Care, Other (non HMO)

## 2012-06-08 ENCOUNTER — Ambulatory Visit (INDEPENDENT_AMBULATORY_CARE_PROVIDER_SITE_OTHER): Payer: Managed Care, Other (non HMO) | Admitting: Family Medicine

## 2012-06-08 VITALS — BP 136/78 | HR 60 | Temp 97.9°F | Resp 18 | Ht 68.5 in | Wt 215.2 lb

## 2012-06-08 DIAGNOSIS — M1990 Unspecified osteoarthritis, unspecified site: Secondary | ICD-10-CM

## 2012-06-08 DIAGNOSIS — R5381 Other malaise: Secondary | ICD-10-CM

## 2012-06-08 DIAGNOSIS — R899 Unspecified abnormal finding in specimens from other organs, systems and tissues: Secondary | ICD-10-CM

## 2012-06-08 DIAGNOSIS — I10 Essential (primary) hypertension: Secondary | ICD-10-CM

## 2012-06-08 DIAGNOSIS — R5383 Other fatigue: Secondary | ICD-10-CM

## 2012-06-08 DIAGNOSIS — G40909 Epilepsy, unspecified, not intractable, without status epilepticus: Secondary | ICD-10-CM

## 2012-06-08 DIAGNOSIS — M25469 Effusion, unspecified knee: Secondary | ICD-10-CM

## 2012-06-08 DIAGNOSIS — M109 Gout, unspecified: Secondary | ICD-10-CM

## 2012-06-08 DIAGNOSIS — R6889 Other general symptoms and signs: Secondary | ICD-10-CM

## 2012-06-08 DIAGNOSIS — E785 Hyperlipidemia, unspecified: Secondary | ICD-10-CM

## 2012-06-08 LAB — POCT SEDIMENTATION RATE: POCT SED RATE: 51 mm/hr — AB (ref 0–22)

## 2012-06-08 LAB — HEPATIC FUNCTION PANEL
AST: 25 U/L (ref 0–37)
Albumin: 4.4 g/dL (ref 3.5–5.2)
Bilirubin, Direct: 0.1 mg/dL (ref 0.0–0.3)
Total Bilirubin: 0.4 mg/dL (ref 0.3–1.2)

## 2012-06-08 MED ORDER — LISINOPRIL 40 MG PO TABS: 40.0000 mg | ORAL_TABLET | Freq: Every day | ORAL | Status: DC

## 2012-06-08 MED ORDER — OXAPROZIN 600 MG PO TABS: ORAL_TABLET | ORAL | Status: DC

## 2012-06-08 MED ORDER — PHENOBARBITAL 97.2 MG PO TABS: 97.2000 mg | ORAL_TABLET | Freq: Every day | ORAL | Status: DC

## 2012-06-08 MED ORDER — PRAVASTATIN SODIUM 40 MG PO TABS: 40.0000 mg | ORAL_TABLET | Freq: Every day | ORAL | Status: DC

## 2012-06-08 MED ORDER — HYDROCHLOROTHIAZIDE 12.5 MG PO CAPS: 12.5000 mg | ORAL_CAPSULE | Freq: Once | ORAL | Status: DC

## 2012-06-08 MED ORDER — POTASSIUM CHLORIDE CRYS ER 20 MEQ PO TBCR: 20.0000 meq | EXTENDED_RELEASE_TABLET | Freq: Two times a day (BID) | ORAL | Status: DC

## 2012-06-08 MED ORDER — ALLOPURINOL 300 MG PO TABS: 300.0000 mg | ORAL_TABLET | Freq: Every day | ORAL | Status: DC

## 2012-06-08 NOTE — Progress Notes (Signed)
Subjective: Patient has a history of gout in the past. He has had his knee swell up gout. He is having the pain for the past month, but last night it was hurting particularly bad. Today he was not able to continue out of work because of the knee pain  Also needs some lab testing done before his other meds to be prescribed apparently.  Objective: Left knee is tender mildly around the joint line and in the popliteal fossa. No effusion is present today. [facility] reading (PRIMARY) by  Dr. Alwyn Ren Degenerative changes and mild medial narrowing  Assessment: Knee pain Degenerative joint disease History of gout HTN Hyperlipidemia . Plan: Prescribe his medications. Prescribed nsaid

## 2012-06-08 NOTE — Patient Instructions (Signed)
Take your medicines as per your previous routine  Take Daypro one twice daily for the knee. If it is not doing better we will inject it.  Return in

## 2012-06-09 ENCOUNTER — Encounter: Payer: Self-pay | Admitting: Family Medicine

## 2012-06-09 LAB — COMPREHENSIVE METABOLIC PANEL
ALT: 17 U/L (ref 0–53)
AST: 26 U/L (ref 0–37)
Albumin: 4.5 g/dL (ref 3.5–5.2)
CO2: 29 mEq/L (ref 19–32)
Calcium: 9.8 mg/dL (ref 8.4–10.5)
Chloride: 100 mEq/L (ref 96–112)
Creat: 1.27 mg/dL (ref 0.50–1.35)
Potassium: 3.4 mEq/L — ABNORMAL LOW (ref 3.5–5.3)
Total Protein: 7.4 g/dL (ref 6.0–8.3)

## 2012-06-09 LAB — LIPID PANEL
Cholesterol: 182 mg/dL (ref 0–200)
Total CHOL/HDL Ratio: 5.7 Ratio

## 2012-06-09 LAB — URIC ACID: Uric Acid, Serum: 5.7 mg/dL (ref 4.0–7.8)

## 2012-06-10 ENCOUNTER — Encounter: Payer: Self-pay | Admitting: Family Medicine

## 2012-08-23 ENCOUNTER — Ambulatory Visit (INDEPENDENT_AMBULATORY_CARE_PROVIDER_SITE_OTHER): Payer: Managed Care, Other (non HMO) | Admitting: Emergency Medicine

## 2012-08-23 VITALS — BP 127/75 | HR 100 | Temp 99.7°F | Resp 18

## 2012-08-23 DIAGNOSIS — J018 Other acute sinusitis: Secondary | ICD-10-CM

## 2012-08-23 MED ORDER — CEFPROZIL 500 MG PO TABS: 500.0000 mg | ORAL_TABLET | Freq: Two times a day (BID) | ORAL | Status: AC

## 2012-08-23 MED ORDER — NAPROXEN SODIUM 550 MG PO TABS: 550.0000 mg | ORAL_TABLET | Freq: Two times a day (BID) | ORAL | Status: DC

## 2012-08-23 MED ORDER — PSEUDOEPHEDRINE-GUAIFENESIN ER 60-600 MG PO TB12: 1.0000 | ORAL_TABLET | Freq: Two times a day (BID) | ORAL | Status: DC

## 2012-08-23 NOTE — Progress Notes (Signed)
[facility] [address] [place] [address] [phone]  Date:  08/23/2012   Name:  Wesley Mccarthy   DOB:  22-Jan-1951   MRN:  [id number]  PCP:  Tally Due, MD    Chief Complaint: Chills, Fatigue and belching   History of Present Illness:  Wesley Mccarthy is a 61 y.o. very pleasant male patient who presents with the following:  Ill since the weekend with fatigue and malaise.  Has nasal congestion and post nasal drainage.  No cough or sore throat. Some fever to 101 and chills at times.  Feels fatigued and malaise and arthralgias.  No nausea or vomiting.  No stool change. Poor appetite.  Spent Sunday with his cousin who was ill as well with a sinus infection/allergies.  No improvement with otc meds.  No abdominal pain .  Occasionally belches less than once daily and that is rather foul tasting.  Patient Active Problem List  Diagnosis  . HTN (hypertension)  . Hyperlipemia  . Gout  . Obesity  . Health care maintenance    Past Medical History  Diagnosis Date  . Seizures     Past Surgical History  Procedure Date  . Appendectomy   . Tonsillectomy     History  Substance Use Topics  . Smoking status: Never Smoker   . Smokeless tobacco: Not on file  . Alcohol Use: No    Family History  Problem Relation Age of Onset  . Heart disease Mother     Had a CABG  . Cancer Father     colon cancer    Allergies  Allergen Reactions  . 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  Marland Kitchen Penicillins     Medication list has been reviewed and updated.  Current Outpatient Prescriptions on File Prior to Visit  Medication Sig Dispense Refill  . allopurinol (ZYLOPRIM) 300 MG tablet Take 1 tablet (300 mg total) by mouth daily.  30 tablet  5  . aspirin EC 81 MG tablet Take 81 mg by mouth daily.        . fluticasone (FLONASE) 50 MCG/ACT nasal spray Place 2 sprays into the nose daily.  16 g  1  . hydrochlorothiazide (MICROZIDE)  12.5 MG capsule Take 1 capsule (12.5 mg total) by mouth once.  30 capsule  12  . lisinopril (PRINIVIL,ZESTRIL) 40 MG tablet Take 1 tablet (40 mg total) by mouth daily.  30 tablet  12  . oxaprozin (DAYPRO) 600 MG tablet One twice daily for knee.  Take with food.  30 tablet  1  . PHENobarbital (LUMINAL) 97.2 MG tablet Take 1 tablet (97.2 mg total) by mouth daily.  30 tablet  5  . potassium chloride SA (K-DUR,KLOR-CON) 20 MEQ tablet Take 1 tablet (20 mEq total) by mouth 2 (two) times daily. Needs labs updated for additional refills.  120 tablet  12  . pravastatin (PRAVACHOL) 40 MG tablet Take 1 tablet (40 mg total) by mouth daily. Need labs for additional refills.  30 tablet  12  . loratadine (CLARITIN) 10 MG tablet Take 1 tablet (10 mg total) by mouth daily.  30 tablet  11  . [DISCONTINUED] potassium chloride (KLOR-CON) 20 MEQ packet Take 20 mEq by mouth 2 (two) times daily.  30 packet  5    Review of Systems:  As per HPI, otherwise negative.    Physical Examination: Filed Vitals:   08/23/12 1346  BP: 127/75  Pulse: 100  Temp: 99.7 F (37.6 C)  Resp: 18   There were no vitals filed for this visit. There is no height or weight on file to calculate BMI. Ideal Body Weight:    GEN: WDWN, NAD, Non-toxic, A & O x 3 HEENT: Atraumatic, Normocephalic. Neck supple. No masses, No LAD. Ears and Nose: No external deformity.  Green nasal drainage CV: RRR, No M/G/R. No JVD. No thrill. No extra heart sounds. PULM: CTA B, no wheezes, crackles, rhonchi. No retractions. No resp. distress. No accessory muscle use. ABD: S, NT, ND, +BS. No rebound. No HSM. EXTR: No c/c/e NEURO Normal gait.  PSYCH: Normally interactive. Conversant. Not depressed or anxious appearing.  Calm demeanor.    Assessment and Plan: Sinusitis mucinex cefzil Follow up as needed  Carmelina Dane, MD

## 2012-08-25 ENCOUNTER — Encounter: Payer: Self-pay | Admitting: *Deleted

## 2012-08-26 ENCOUNTER — Ambulatory Visit (INDEPENDENT_AMBULATORY_CARE_PROVIDER_SITE_OTHER): Payer: Managed Care, Other (non HMO) | Admitting: Family Medicine

## 2012-08-26 VITALS — BP 129/68 | HR 94 | Temp 98.1°F | Resp 16 | Ht 70.0 in | Wt 217.0 lb

## 2012-08-26 DIAGNOSIS — Z23 Encounter for immunization: Secondary | ICD-10-CM

## 2012-08-26 DIAGNOSIS — G40909 Epilepsy, unspecified, not intractable, without status epilepticus: Secondary | ICD-10-CM

## 2012-08-26 DIAGNOSIS — K219 Gastro-esophageal reflux disease without esophagitis: Secondary | ICD-10-CM

## 2012-08-26 MED ORDER — PANTOPRAZOLE SODIUM 40 MG PO TBEC: 40.0000 mg | DELAYED_RELEASE_TABLET | Freq: Every day | ORAL | Status: DC

## 2012-08-26 NOTE — Progress Notes (Signed)
Subjective: Patient is complaining of having burps smell like rotten a. His wife is complaining that. He was just treated for a sinusitis, and is doing better from that. Does not describe any particular foods causing his problems.  Objective: Afro-American male in no acute distress. Throat clear. Neck supple without nodes thyromegaly. Chest clear. Heart regular without murmurs. Abdomen is full. Feels like he probably constipated. He denies major constipation problems  . Assessment: Bowel burps, probably secondary GERD.Marland Kitchen From constipation Sinusitis improving  Plan: MiraLax Could not give Reglan because of his seizure history. He probably has GERD and remote Protonix.

## 2012-08-26 NOTE — Patient Instructions (Addendum)
Take miralax one dose daily until bowels are a little loose, then decrease to on an as needed basis.

## 2012-09-04 NOTE — Progress Notes (Signed)
Reviewed and agree.

## 2012-09-12 ENCOUNTER — Ambulatory Visit (INDEPENDENT_AMBULATORY_CARE_PROVIDER_SITE_OTHER): Payer: Managed Care, Other (non HMO) | Admitting: Family Medicine

## 2012-09-12 VITALS — BP 105/69 | HR 84 | Temp 98.6°F | Resp 16 | Ht 70.0 in | Wt 215.0 lb

## 2012-09-12 DIAGNOSIS — K529 Noninfective gastroenteritis and colitis, unspecified: Secondary | ICD-10-CM

## 2012-09-12 DIAGNOSIS — K5289 Other specified noninfective gastroenteritis and colitis: Secondary | ICD-10-CM

## 2012-09-12 MED ORDER — ONDANSETRON 4 MG PO TBDP: 4.0000 mg | ORAL_TABLET | Freq: Three times a day (TID) | ORAL | Status: DC | PRN

## 2012-09-12 NOTE — Patient Instructions (Signed)
Drink plenty of fluids  Avoid rich spicy foods  You probably can return to work tonight if you're feeling better, but if you are worse I will extend the excuse

## 2012-09-12 NOTE — Progress Notes (Signed)
Subjective: 61 year old man who developed vomiting last night. This was about 9:30 in the evening. He vomited at least 3 times. He says the last time he vomited everything seemed to come out. He has not had any more vomiting. He drank a little with his medicine this morning but has not had anything major to eat or drink. He felt feverish and chilled last night and had to bundle up. He did not take his temperature. His wife was a little sick also. He does not believe that it was food poisoning because they had only had some fairly benign-type foods. He had to miss going to work last night. He is feeling little bit better this morning.  Objective: Pleasant gentleman in no distress. No CVA tenderness. Abdomen has normal bowel sounds. Soft without masses or tenderness.  Assessment: Gastroenteritis  Plan:  I do not believe he needs any labs at this time. If he gets worse he is to get back to me. I will caution him that he may get a little diarrhea, this often happens after having had the vomiting.

## 2012-12-08 ENCOUNTER — Ambulatory Visit (INDEPENDENT_AMBULATORY_CARE_PROVIDER_SITE_OTHER): Payer: Managed Care, Other (non HMO) | Admitting: Family Medicine

## 2012-12-08 VITALS — BP 128/82 | HR 70 | Temp 97.8°F | Resp 16 | Ht 67.58 in | Wt 222.0 lb

## 2012-12-08 DIAGNOSIS — I872 Venous insufficiency (chronic) (peripheral): Secondary | ICD-10-CM

## 2012-12-08 DIAGNOSIS — IMO0002 Reserved for concepts with insufficient information to code with codable children: Secondary | ICD-10-CM

## 2012-12-08 DIAGNOSIS — L309 Dermatitis, unspecified: Secondary | ICD-10-CM

## 2012-12-08 DIAGNOSIS — M1710 Unilateral primary osteoarthritis, unspecified knee: Secondary | ICD-10-CM

## 2012-12-08 DIAGNOSIS — M170 Bilateral primary osteoarthritis of knee: Secondary | ICD-10-CM

## 2012-12-08 DIAGNOSIS — L259 Unspecified contact dermatitis, unspecified cause: Secondary | ICD-10-CM

## 2012-12-08 DIAGNOSIS — I8310 Varicose veins of unspecified lower extremity with inflammation: Secondary | ICD-10-CM

## 2012-12-08 MED ORDER — TRIAMCINOLONE ACETONIDE 0.1 % EX LOTN: TOPICAL_LOTION | Freq: Two times a day (BID) | CUTANEOUS | Status: DC

## 2012-12-08 MED ORDER — NAPROXEN SODIUM 550 MG PO TABS: 550.0000 mg | ORAL_TABLET | Freq: Two times a day (BID) | ORAL | Status: DC

## 2012-12-08 MED ORDER — METHYLPREDNISOLONE ACETATE 80 MG/ML IJ SUSP
80.0000 mg | Freq: Once | INTRAMUSCULAR | Status: AC
  Administered 2012-12-08: 80 mg via INTRAMUSCULAR

## 2012-12-08 NOTE — Progress Notes (Signed)
Subjective: Patient has been having a rash on his back and on his legs it is been itching him a lot. He says he is rubbing his back against the wall at a corner all the time at work. He is not aware of his feet swelling a lot.  Objective: There is a patch of eczema on his right scapula. Initially it looked almost like shingles, but it is been there for several weeks and primarily itches and I think it is not shingles. It's eczematoid appearance. He also had stasis dermatitis on his legs. The skin is mildly shiny there is just minimal edema.  Assessment: Stasis dermatitis Eczema Osteoarthritis  Plan: He wanted a refill on his naproxen which I gave him. Advised to eat less salt. Use the triamcinolone lotion on the rash. Return if worse.

## 2012-12-08 NOTE — Patient Instructions (Signed)
Use the lotion on the back and legs on the rash twice daily. Once a rash calms down cut back to just once daily. Return if worse or not improving.  Avoid excessive salt to reduce swelling in the legs. When your just sitting around, try to elevate her feet.

## 2012-12-21 ENCOUNTER — Ambulatory Visit (INDEPENDENT_AMBULATORY_CARE_PROVIDER_SITE_OTHER): Payer: Managed Care, Other (non HMO) | Admitting: Family Medicine

## 2012-12-21 VITALS — BP 122/78 | HR 55 | Temp 97.6°F | Resp 16 | Ht 69.0 in | Wt 226.0 lb

## 2012-12-21 DIAGNOSIS — I872 Venous insufficiency (chronic) (peripheral): Secondary | ICD-10-CM

## 2012-12-21 DIAGNOSIS — I8310 Varicose veins of unspecified lower extremity with inflammation: Secondary | ICD-10-CM

## 2012-12-21 DIAGNOSIS — L259 Unspecified contact dermatitis, unspecified cause: Secondary | ICD-10-CM

## 2012-12-21 DIAGNOSIS — L300 Nummular dermatitis: Secondary | ICD-10-CM

## 2012-12-21 DIAGNOSIS — R609 Edema, unspecified: Secondary | ICD-10-CM

## 2012-12-21 MED ORDER — CLOBETASOL PROP EMOLLIENT BASE 0.05 % EX CREA: 1.0000 "application " | TOPICAL_CREAM | Freq: Two times a day (BID) | CUTANEOUS | Status: DC

## 2012-12-21 MED ORDER — FUROSEMIDE 20 MG PO TABS: 20.0000 mg | ORAL_TABLET | Freq: Every day | ORAL | Status: DC

## 2012-12-21 NOTE — Progress Notes (Signed)
  Subjective:    Patient ID: Wesley Mccarthy, male    DOB: 1951/04/25, 62 y.o.   MRN: [id number]  HPI Pt fell yesterday and injured left knee. He also c/o rash on both legs and his back. Rash has been there for a month or more. He was seen 2 weeks ago here for this. He was given a cream and a shot. He says this helped somewhat, but then he started itching again on his legs.    Review of Systems     Objective:   Physical Exam        Assessment & Plan:

## 2012-12-21 NOTE — Patient Instructions (Signed)
Use the clobetasol cream on the rash on the back. Continue the triamcinolone on the legs.  Discontinue the hydrochlorothiazide for 2 weeks while taking the Lasix 20 mg daily for fluid and swelling.  The knee should take care of itself with time  Return if problems

## 2012-12-21 NOTE — Progress Notes (Signed)
Subjective: Patient is here for recheck of the rash. He continues to have itching of his legs and right upper scapular area. He has been using the triamcinolone cream.  Yesterday he slipped on the porch ice and landed on his left knee.  Objective: Left knee has a contusion at the distal patella and the tibial tuberosity area. These are tender. He has full range of motion. Able to stand on them.  The rash on the right shoulder is not as red as before, but it still is thick splotchy.  The legs have slightly less edema, though this is early in the day, and they do not appear as inflamed. Still are excoriated some.  Assessment: Contusion knee Eczematoid dermatitis upper back Stasis dermatitis of legs  Plan: The knee should be fine with time. No imaging was ordered.  He change the cream for the upper back  to clobetasol  ADD a low-dose diuretic for the legs

## 2013-01-08 ENCOUNTER — Other Ambulatory Visit: Payer: Self-pay | Admitting: Family Medicine

## 2013-01-10 ENCOUNTER — Other Ambulatory Visit: Payer: Self-pay | Admitting: Radiology

## 2013-01-10 DIAGNOSIS — G40909 Epilepsy, unspecified, not intractable, without status epilepticus: Secondary | ICD-10-CM

## 2013-01-10 NOTE — Telephone Encounter (Signed)
rc'd fax from [facility] for phenobarbital please advise. pended

## 2013-01-11 NOTE — Telephone Encounter (Signed)
Forward to Dr. Hopper 

## 2013-01-12 MED ORDER — PHENOBARBITAL 97.2 MG PO TABS: 97.2000 mg | ORAL_TABLET | Freq: Every day | ORAL | Status: DC

## 2013-01-29 ENCOUNTER — Ambulatory Visit (INDEPENDENT_AMBULATORY_CARE_PROVIDER_SITE_OTHER): Payer: Managed Care, Other (non HMO) | Admitting: Family Medicine

## 2013-01-29 VITALS — BP 128/82 | HR 86 | Temp 97.9°F | Resp 18 | Ht 68.0 in | Wt 226.0 lb

## 2013-01-29 DIAGNOSIS — R05 Cough: Secondary | ICD-10-CM

## 2013-01-29 DIAGNOSIS — R0989 Other specified symptoms and signs involving the circulatory and respiratory systems: Secondary | ICD-10-CM

## 2013-01-29 DIAGNOSIS — J302 Other seasonal allergic rhinitis: Secondary | ICD-10-CM

## 2013-01-29 DIAGNOSIS — R059 Cough, unspecified: Secondary | ICD-10-CM

## 2013-01-29 DIAGNOSIS — J309 Allergic rhinitis, unspecified: Secondary | ICD-10-CM

## 2013-01-29 DIAGNOSIS — J329 Chronic sinusitis, unspecified: Secondary | ICD-10-CM

## 2013-01-29 DIAGNOSIS — G40909 Epilepsy, unspecified, not intractable, without status epilepticus: Secondary | ICD-10-CM

## 2013-01-29 MED ORDER — AZITHROMYCIN 250 MG PO TABS: ORAL_TABLET | ORAL | Status: DC

## 2013-01-29 MED ORDER — PHENOBARBITAL 97.2 MG PO TABS: 97.2000 mg | ORAL_TABLET | Freq: Every day | ORAL | Status: DC

## 2013-01-29 MED ORDER — PREDNISONE 20 MG PO TABS: ORAL_TABLET | ORAL | Status: DC

## 2013-01-29 NOTE — Progress Notes (Signed)
62 yo man who developed cough and nasal congestion over the past 24 hours, associated with a  Couple weeks of itchy eyes.  Patient on chronic phenobarbital for seizure disorder and has not had a seizure in years. He was originally on both phenobarbital and Dilantin the Dilantin was discontinued and he's done well on single drug therapy  Objective:  NAD HEENT: pale conjunctiva, nasal passages, otherwise neg Chest:  No rales or ronchi Heart:  Irregular pulse every 3-5 beets Extrem:  No edema, Skin: no rashes.  Ekg:  Multiple PVCs without malignant rhythm  Assessment: Suspect that the Sudafed is causing the pulse irregularity and I have asked the patient to stop taking it.  Patient's been stable on phenobarbital and I agreed to refill this

## 2013-02-01 ENCOUNTER — Ambulatory Visit (INDEPENDENT_AMBULATORY_CARE_PROVIDER_SITE_OTHER): Payer: Managed Care, Other (non HMO) | Admitting: Family Medicine

## 2013-02-01 VITALS — BP 142/82 | HR 84 | Temp 98.5°F | Resp 17 | Ht 68.5 in | Wt 227.0 lb

## 2013-02-01 DIAGNOSIS — R059 Cough, unspecified: Secondary | ICD-10-CM

## 2013-02-01 DIAGNOSIS — R002 Palpitations: Secondary | ICD-10-CM

## 2013-02-01 DIAGNOSIS — R05 Cough: Secondary | ICD-10-CM

## 2013-02-01 MED ORDER — FLUTICASONE PROPIONATE 50 MCG/ACT NA SUSP: 2.0000 | Freq: Every day | NASAL | Status: DC

## 2013-02-01 MED ORDER — BENZONATATE 100 MG PO CAPS: ORAL_CAPSULE | ORAL | Status: DC

## 2013-02-01 NOTE — Progress Notes (Signed)
Subjective: Patient continues to have a lot of coughing and congestion despite the medications he is on. He wondered if he had a shot that couldn't get rid of it quickly. I told him he was arty on the prednisone.  He is not complaining of the PVCs anymore. He stopped taking the decongestant.  Objective: TMs normal. Throat clear. Neck supple without nodes. Chest clear to auscultation. Heart regular.On a long 1 min auscultation i no ectopy was noted.  Assessment: Palpitations, secondary to pseudoephedrine, resolved Bronchitis, possibly allergic  Plan Flonase and Tessalon. Continue the prednisone and Zithromax he was arty on. Return if worse.

## 2013-02-01 NOTE — Patient Instructions (Signed)
Continue to avoid medications that contain pseudoephedrine. This is what is probably causing your skipped heartbeats. Avoid the things with "D" in the cough and cold medicines.  Take Tessalon one pill 3 times daily for the coughing  Use the Flonase (fluticasone) 2 sprays each nostril twice daily for 3 days, then cut back to once daily

## 2013-03-12 ENCOUNTER — Other Ambulatory Visit: Payer: Self-pay | Admitting: Family Medicine

## 2013-03-13 ENCOUNTER — Other Ambulatory Visit: Payer: Self-pay | Admitting: Family Medicine

## 2013-03-14 NOTE — Telephone Encounter (Signed)
Called pt to check status. Pt reports that he is much better, but he still has some cough, esp at night. It is non-productive, no fever or any other Sxs. Dr Alwyn Ren, do you want to give pt another RF, or do you want to recheck him first?

## 2013-03-16 NOTE — Telephone Encounter (Signed)
Refill once 

## 2013-05-10 ENCOUNTER — Other Ambulatory Visit: Payer: Self-pay | Admitting: Physician Assistant

## 2013-05-10 ENCOUNTER — Other Ambulatory Visit: Payer: Self-pay | Admitting: Family Medicine

## 2013-06-14 ENCOUNTER — Other Ambulatory Visit: Payer: Self-pay | Admitting: Family Medicine

## 2013-07-01 ENCOUNTER — Other Ambulatory Visit: Payer: Self-pay | Admitting: Family Medicine

## 2013-07-15 ENCOUNTER — Other Ambulatory Visit: Payer: Self-pay | Admitting: Physician Assistant

## 2013-07-15 ENCOUNTER — Other Ambulatory Visit: Payer: Self-pay | Admitting: Family Medicine

## 2013-07-24 ENCOUNTER — Ambulatory Visit (INDEPENDENT_AMBULATORY_CARE_PROVIDER_SITE_OTHER): Payer: Managed Care, Other (non HMO) | Admitting: Family Medicine

## 2013-07-24 VITALS — BP 136/80 | HR 98 | Temp 98.0°F | Resp 18 | Ht 68.0 in | Wt 233.0 lb

## 2013-07-24 DIAGNOSIS — E876 Hypokalemia: Secondary | ICD-10-CM

## 2013-07-24 DIAGNOSIS — I10 Essential (primary) hypertension: Secondary | ICD-10-CM

## 2013-07-24 DIAGNOSIS — G40909 Epilepsy, unspecified, not intractable, without status epilepticus: Secondary | ICD-10-CM

## 2013-07-24 DIAGNOSIS — D649 Anemia, unspecified: Secondary | ICD-10-CM

## 2013-07-24 DIAGNOSIS — Z23 Encounter for immunization: Secondary | ICD-10-CM

## 2013-07-24 DIAGNOSIS — E785 Hyperlipidemia, unspecified: Secondary | ICD-10-CM

## 2013-07-24 DIAGNOSIS — Z125 Encounter for screening for malignant neoplasm of prostate: Secondary | ICD-10-CM

## 2013-07-24 DIAGNOSIS — M109 Gout, unspecified: Secondary | ICD-10-CM

## 2013-07-24 LAB — POCT CBC
HCT, POC: 42.9 % — AB (ref 43.5–53.7)
Lymph, poc: 1.4 (ref 0.6–3.4)
MCHC: 31 g/dL — AB (ref 31.8–35.4)
MPV: 7 fL (ref 0–99.8)
POC Granulocyte: 2.1 (ref 2–6.9)
POC LYMPH PERCENT: 36.1 %L (ref 10–50)
POC MID %: 11.3 %M (ref 0–12)
RDW, POC: 13.4 %
WBC: 4 10*3/uL — AB (ref 4.6–10.2)

## 2013-07-24 MED ORDER — POTASSIUM CHLORIDE CRYS ER 20 MEQ PO TBCR: 20.0000 meq | EXTENDED_RELEASE_TABLET | Freq: Every day | ORAL | Status: DC

## 2013-07-24 MED ORDER — HYDROCHLOROTHIAZIDE 12.5 MG PO CAPS: 12.5000 mg | ORAL_CAPSULE | ORAL | Status: DC

## 2013-07-24 MED ORDER — PRAVASTATIN SODIUM 40 MG PO TABS: 40.0000 mg | ORAL_TABLET | Freq: Every day | ORAL | Status: DC

## 2013-07-24 MED ORDER — LISINOPRIL 40 MG PO TABS: 40.0000 mg | ORAL_TABLET | Freq: Every day | ORAL | Status: DC

## 2013-07-24 MED ORDER — PHENOBARBITAL 97.2 MG PO TABS: 97.2000 mg | ORAL_TABLET | Freq: Every day | ORAL | Status: DC

## 2013-07-24 MED ORDER — FUROSEMIDE 20 MG PO TABS: 20.0000 mg | ORAL_TABLET | Freq: Every day | ORAL | Status: DC

## 2013-07-24 MED ORDER — ALLOPURINOL 300 MG PO TABS: 300.0000 mg | ORAL_TABLET | Freq: Every day | ORAL | Status: DC

## 2013-07-24 NOTE — Progress Notes (Addendum)
[facility] [address] [place] [address] [phone]  Date:  07/24/2013   Name:  Wesley Mccarthy   DOB:  11-14-1950   MRN:  [id number]  PCP:  Tally Due, MD    Chief Complaint: rx refills and Flu Vaccine   History of Present Illness:  Wesley Mccarthy is a 62 y.o. very pleasant male patient who presents with the following:  He is here today for med refills and flu shot.  Last ate at 5am.  He works overnight as a Futures trader.   He has a history of gout, HTN, seizure disorder and obesity.    Patient Active Problem List   Diagnosis Date Noted  . 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 Stasis dermatitis of both legs 12/21/2012  . Edema 12/21/2012  . Seizure disorder 08/26/2012  . HTN (hypertension) 11/11/2011  . Hyperlipemia 11/11/2011  . Gout 11/11/2011  . Obesity 11/11/2011  . Health care maintenance 11/11/2011    Past Medical History  Diagnosis Date  . Seizures   . Arthritis   . Allergy   . Hypertension     Past Surgical History  Procedure Laterality Date  . Appendectomy    . Tonsillectomy      History  Substance Use Topics  . Smoking status: Never Smoker   . Smokeless tobacco: Not on file  . Alcohol Use: No    Family History  Problem Relation Age of Onset  . Heart disease Mother     Had a CABG  . Cancer Father     colon cancer    Allergies  Allergen Reactions  . 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  Marland Kitchen Penicillins     Medication list has been reviewed and updated.  Current Outpatient Prescriptions on File Prior to Visit  Medication Sig Dispense Refill  . allopurinol (ZYLOPRIM) 300 MG tablet Take 1 tablet (300 mg total) by mouth daily. PATIENT NEEDS OFFICE VISIT FOR ADDITIONAL REFILLS  30 tablet  0  . fluticasone (FLONASE) 50 MCG/ACT nasal spray Place 2 sprays into the nose daily.  16 g  6  . furosemide (LASIX) 20 MG tablet TAKE 1 TABLET (20 MG TOTAL) BY MOUTH DAILY.  30 tablet  0  .  hydrochlorothiazide (MICROZIDE) 12.5 MG capsule Take 1 capsule (12.5 mg total) by mouth every morning. Needs office visit  15 capsule  0  . lisinopril (PRINIVIL,ZESTRIL) 40 MG tablet Take 1 tablet (40 mg total) by mouth daily. PATIENT NEEDS OFFICE VISIT FOR ADDITIONAL REFILLS  30 tablet  0  . loratadine (CLARITIN) 10 MG tablet Take 1 tablet (10 mg total) by mouth daily.  30 tablet  11  . potassium chloride SA (K-DUR,KLOR-CON) 20 MEQ tablet Take 1 tablet (20 mEq total) by mouth daily. Needs office visit  15 tablet  0  . pravastatin (PRAVACHOL) 40 MG tablet Take 1 tablet (40 mg total) by mouth daily. Need labs for additional refills.  30 tablet  12  . aspirin EC 81 MG tablet Take 81 mg by mouth daily.        Marland Kitchen azithromycin (ZITHROMAX Z-PAK) 250 MG tablet Take as directed on pack  6 tablet  0  . benzonatate (TESSALON) 100 MG capsule TAKE 1 OR 2 CAPSULES 3 TIMES DAILY AS NECESSARY FOR COUGH. MAY BE USED WITH OTHER COUGH MEDICINES IF NEEDED.  30 capsule  0  . fluticasone (FLONASE) 50 MCG/ACT nasal spray Place 2 sprays  into the nose as needed.      . naproxen sodium (ANAPROX) 550 MG tablet TAKE 1 TABLET (550 MG TOTAL) BY MOUTH 2 (TWO) TIMES DAILY WITH A MEAL.  60 tablet  2  . pantoprazole (PROTONIX) 40 MG tablet TAKE 1 TABLET (40 MG TOTAL) BY MOUTH DAILY. PATIENT NEEDS OFFICE VISITFOR ADDITIONAL REFILLS  30 tablet  0  . predniSONE (DELTASONE) 20 MG tablet 2 daily with food  10 tablet  1  . [DISCONTINUED] potassium chloride (KLOR-CON) 20 MEQ packet Take 20 mEq by mouth 2 (two) times daily.  30 packet  5   No current facility-administered medications on file prior to visit.    Review of Systems:  As per HPI- otherwise negative. Last seizure was more than 10 years ago  Physical Examination: Filed Vitals:   07/24/13 0946  BP: 136/80  Pulse: 98  Temp: 98 F (36.7 C)  Resp: 18   Filed Vitals:   07/24/13 0946  Height: 5\' 8"  (1.727 m)  Weight: 233 lb (105.688 kg)   Body mass index is 35.44  kg/(m^2). Ideal Body Weight: Weight in (lb) to have BMI = 25: 164.1  GEN: WDWN, NAD, Non-toxic, A & O x 3, obesity, looks well HEENT: Atraumatic, Normocephalic. Neck supple. No masses, No LAD.  Bilateral TM wnl, oropharynx normal.  PEERL,EOMI.   Ears and Nose: No external deformity. CV: RRR, No M/G/R. No JVD. No thrill. No extra heart sounds. PULM: CTA B, no wheezes, crackles, rhonchi. No retractions. No resp. distress. No accessory muscle use. EXTR: No c/c/e NEURO Normal gait.  PSYCH: Normally interactive. Conversant. Not depressed or anxious appearing.  Calm demeanor.   Results for orders placed in visit on 07/24/13  POCT CBC      Result Value Range   WBC 4.0 (*) 4.6 - 10.2 K/uL   Lymph, poc 1.4  0.6 - 3.4   POC LYMPH PERCENT 36.1  10 - 50 %L   MID (cbc) 0.5  0 - 0.9   POC MID % 11.3  0 - 12 %M   POC Granulocyte 2.1  2 - 6.9   Granulocyte percent 52.6  37 - 80 %G   RBC 4.47 (*) 4.69 - 6.13 M/uL   Hemoglobin 13.3 (*) 14.1 - 18.1 g/dL   HCT, POC 40.9 (*) 81.1 - 53.7 %   MCV 95.9  80 - 97 fL   MCH, POC 29.8  27 - 31.2 pg   MCHC 31.0 (*) 31.8 - 35.4 g/dL   RDW, POC 91.4     Platelet Count, POC 186  142 - 424 K/uL   MPV 7.0  0 - 99.8 fL    Assessment and Plan: Hyperlipidemia - Plan: pravastatin (PRAVACHOL) 40 MG tablet, Lipid panel  Essential hypertension, benign - Plan: furosemide (LASIX) 20 MG tablet, hydrochlorothiazide (MICROZIDE) 12.5 MG capsule, lisinopril (PRINIVIL,ZESTRIL) 40 MG tablet, potassium chloride SA (K-DUR,KLOR-CON) 20 MEQ tablet, POCT CBC, Comprehensive metabolic panel  Gout - Plan: allopurinol (ZYLOPRIM) 300 MG tablet  Seizure disorder - Plan: PHENobarbital (LUMINAL) 97.2 MG tablet, Phenobarbital level  Prostate cancer screening - Plan: PSA  BP controlled.  Labs pending as above.  Flu shot today Check phenobarb level  Signed Abbe Amsterdam, MD  Called on 10/22 to go over his labs  Results for orders placed in visit on 07/24/13  COMPREHENSIVE  METABOLIC PANEL      Result Value Range   Sodium 137  135 - 145 mEq/L   Potassium 3.0 (*) 3.5 -  5.3 mEq/L   Chloride 101  96 - 112 mEq/L   CO2 27  19 - 32 mEq/L   Glucose, Bld 97  70 - 99 mg/dL   BUN 19  6 - 23 mg/dL   Creat 1.61 (*) 0.96 - 1.35 mg/dL   Total Bilirubin 0.3  0.3 - 1.2 mg/dL   Alkaline Phosphatase 119 (*) 39 - 117 U/L   AST 21  0 - 37 U/L   ALT 12  0 - 53 U/L   Total Protein 6.9  6.0 - 8.3 g/dL   Albumin 4.1  3.5 - 5.2 g/dL   Calcium 9.3  8.4 - 04.5 mg/dL  LIPID PANEL      Result Value Range   Cholesterol 168  0 - 200 mg/dL   Triglycerides 409 (*) <150 mg/dL   HDL 28 (*) >81 mg/dL   Total CHOL/HDL Ratio 6.0     VLDL 67 (*) 0 - 40 mg/dL   LDL Cholesterol 73  0 - 99 mg/dL  PSA      Result Value Range   PSA 1.67  <=4.00 ng/mL  PHENOBARBITAL LEVEL      Result Value Range   Phenobarbital 17.0  15.0 - 40.0 ug/mL  POCT CBC      Result Value Range   WBC 4.0 (*) 4.6 - 10.2 K/uL   Lymph, poc 1.4  0.6 - 3.4   POC LYMPH PERCENT 36.1  10 - 50 %L   MID (cbc) 0.5  0 - 0.9   POC MID % 11.3  0 - 12 %M   POC Granulocyte 2.1  2 - 6.9   Granulocyte percent 52.6  37 - 80 %G   RBC 4.47 (*) 4.69 - 6.13 M/uL   Hemoglobin 13.3 (*) 14.1 - 18.1 g/dL   HCT, POC 19.1 (*) 47.8 - 53.7 %   MCV 95.9  80 - 97 fL   MCH, POC 29.8  27 - 31.2 pg   MCHC 31.0 (*) 31.8 - 35.4 g/dL   RDW, POC 29.5     Platelet Count, POC 186  142 - 424 K/uL   MPV 7.0  0 - 99.8 fL   He has actually been out of his K for 3 or 4 days.  Also, his creatinine is up a bit.  He will hold his lasix for a few days and get back on his K.  He will come in for a BMP in about one week as a lab visit only.  Also, he will double up on his provachol to 80 mg a day.

## 2013-07-24 NOTE — Patient Instructions (Signed)
Good to see you today- I will be in touch regarding your labs.  Take care!

## 2013-07-25 LAB — COMPREHENSIVE METABOLIC PANEL
Alkaline Phosphatase: 119 U/L — ABNORMAL HIGH (ref 39–117)
BUN: 19 mg/dL (ref 6–23)
Glucose, Bld: 97 mg/dL (ref 70–99)
Sodium: 137 mEq/L (ref 135–145)
Total Bilirubin: 0.3 mg/dL (ref 0.3–1.2)
Total Protein: 6.9 g/dL (ref 6.0–8.3)

## 2013-07-25 LAB — LIPID PANEL
Cholesterol: 168 mg/dL (ref 0–200)
LDL Cholesterol: 73 mg/dL (ref 0–99)
Total CHOL/HDL Ratio: 6 Ratio
Triglycerides: 335 mg/dL — ABNORMAL HIGH (ref <150)
VLDL: 67 mg/dL — ABNORMAL HIGH (ref 0–40)

## 2013-07-26 ENCOUNTER — Encounter: Payer: Self-pay | Admitting: Family Medicine

## 2013-07-26 NOTE — Addendum Note (Signed)
Addended by: Abbe Amsterdam C on: 07/26/2013 04:38 PM   Modules accepted: Orders

## 2013-07-26 NOTE — Addendum Note (Signed)
Addended by: Abbe Amsterdam C on: 07/26/2013 04:43 PM   Modules accepted: Orders

## 2013-07-28 ENCOUNTER — Ambulatory Visit (INDEPENDENT_AMBULATORY_CARE_PROVIDER_SITE_OTHER): Payer: Managed Care, Other (non HMO) | Admitting: Family Medicine

## 2013-07-28 VITALS — BP 132/78 | HR 64 | Temp 98.4°F | Resp 18 | Ht 67.75 in | Wt 231.2 lb

## 2013-07-28 DIAGNOSIS — N529 Male erectile dysfunction, unspecified: Secondary | ICD-10-CM

## 2013-07-28 DIAGNOSIS — J040 Acute laryngitis: Secondary | ICD-10-CM

## 2013-07-28 DIAGNOSIS — I10 Essential (primary) hypertension: Secondary | ICD-10-CM

## 2013-07-28 DIAGNOSIS — E876 Hypokalemia: Secondary | ICD-10-CM

## 2013-07-28 LAB — POCT RAPID STREP A (OFFICE): Rapid Strep A Screen: NEGATIVE

## 2013-07-28 MED ORDER — POTASSIUM CHLORIDE CRYS ER 20 MEQ PO TBCR: 20.0000 meq | EXTENDED_RELEASE_TABLET | Freq: Every day | ORAL | Status: DC

## 2013-07-28 MED ORDER — SILDENAFIL CITRATE 100 MG PO TABS: 50.0000 mg | ORAL_TABLET | Freq: Every day | ORAL | Status: DC | PRN

## 2013-07-28 NOTE — Progress Notes (Signed)
[facility]:  Office Visit  Chief Complaint:  Chief Complaint  Patient presents with  . can't talk    pt states he has been unable to talk since tuesday  . Medication Refill    HPI: Wesley Mccarthy is a 62 y.o. male who is here for Laryngitis. He states it started on Tuesday. He does not complain of being sick with fever or chills. Denies sore throat. He just started losing his voice on Tuesday Evening.  He has no idea what may have triggered it, he was here 10/20 for  Med refills and also flu vaccine.  He has been around 1 child  But not sure if they were sick He has HTN ,  When he got his blood work done on 10/20 his CBC showed slight leukopenia and anemia and also a hypokalmei aof 3.0 . His meds were refilled but he states that the potassium was not received by the pharmacy So he has not been taking his Potassium. He denies CP, palpitations, SOB, n/v/abd pain He would like some viagra, he has no problems with libido, getting erection but has probems with maintaining an erection. HE denies fertility issues.   Past Medical History  Diagnosis Date  . Seizures   . Arthritis   . Allergy   . Hypertension    Past Surgical History  Procedure Laterality Date  . Appendectomy    . Tonsillectomy     History   Social History  . Marital Status: Married    Spouse Name: N/A    Number of Children: N/A  . Years of Education: N/A   Social History Main Topics  . Smoking status: Never Smoker   . Smokeless tobacco: None  . Alcohol Use: No  . Drug Use: No  . Sexual Activity: Yes    Birth Control/ Protection: None   Other Topics Concern  . None   Social History Narrative   Married. Education: [employer].    Family History  Problem Relation Age of Onset  . Heart disease Mother     Had a CABG  . Cancer Father     colon cancer   Allergies  Allergen Reactions  . 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  Marland Kitchen Penicillins    Prior to  Admission medications   Medication Sig Start Date End Date Taking? Authorizing Provider  allopurinol (ZYLOPRIM) 300 MG tablet Take 1 tablet (300 mg total) by mouth daily. 07/24/13  Yes Jessica C Copland, MD  fluticasone (FLONASE) 50 MCG/ACT nasal spray Place 2 sprays into the nose daily. 02/01/13  Yes Peyton Najjar, MD  furosemide (LASIX) 20 MG tablet Take 1 tablet (20 mg total) by mouth daily. 07/24/13  Yes Gwenlyn Found Copland, MD  lisinopril (PRINIVIL,ZESTRIL) 40 MG tablet Take 1 tablet (40 mg total) by mouth daily. 07/24/13  Yes Gwenlyn Found Copland, MD  naproxen sodium (ANAPROX) 550 MG tablet TAKE 1 TABLET (550 MG TOTAL) BY MOUTH 2 (TWO) TIMES DAILY WITH A MEAL. 03/12/13  Yes Eleanore E Egan, PA-C  PHENobarbital (LUMINAL) 97.2 MG tablet Take 1 tablet (97.2 mg total) by mouth daily. 07/24/13  Yes Gwenlyn Found Copland, MD  potassium chloride SA (K-DUR,KLOR-CON) 20 MEQ tablet Take 1 tablet (20 mEq total) by mouth daily. 07/24/13  Yes Gwenlyn Found Copland, MD  pravastatin (PRAVACHOL) 40 MG tablet Take 1 tablet (40 mg total) by mouth daily. 07/24/13  Yes Pearline Cables, MD  aspirin EC  81 MG tablet Take 81 mg by mouth daily.      Historical Provider, MD  benzonatate (TESSALON) 100 MG capsule TAKE 1 OR 2 CAPSULES 3 TIMES DAILY AS NECESSARY FOR COUGH. MAY BE USED WITH OTHER COUGH MEDICINES IF NEEDED. 03/13/13   Peyton Najjar, MD  fluticasone [facility]) 50 MCG/ACT nasal spray Place 2 sprays into the nose as needed. 02/11/12 02/10/13  Peyton Najjar, MD  hydrochlorothiazide (MICROZIDE) 12.5 MG capsule Take 1 capsule (12.5 mg total) by mouth every morning. 07/24/13   Gwenlyn Found Copland, MD  loratadine (CLARITIN) 10 MG tablet Take 1 tablet (10 mg total) by mouth daily. 02/04/12 07/24/13  Lamar Laundry, MD     ROS: The patient denies fevers, chills, night sweats, unintentional weight loss, chest pain, palpitations, wheezing, dyspnea on exertion, nausea, vomiting, abdominal pain, dysuria, hematuria, melena, numbness,  weakness, or tingling.  All other systems have been reviewed and were otherwise negative with the exception of those mentioned in the HPI and as above.    PHYSICAL EXAM: Filed Vitals:   07/28/13 1017  BP: 132/78  Pulse: 64  Temp: 98.4 F (36.9 C)  Resp: 18   Filed Vitals:   07/28/13 1017  Height: 5' 7.75" (1.721 m)  Weight: 231 lb 3.2 oz (104.872 kg)   Body mass index is 35.41 kg/(m^2).  General: Alert, no acute distress HEENT:  Normocephalic, atraumatic, oropharynx patent. EOMI, PERRLA, + erythema without exudates, TM NL, no sinus pressure Cardiovascular:  Regular rate and rhythm, no rubs murmurs or gallops.  No Carotid bruits, radial pulse intact. No pedal edema.  Respiratory: Clear to auscultation bilaterally.  No wheezes, rales, or rhonchi.  No cyanosis, no use of accessory musculature GI: No organomegaly, abdomen is soft and non-tender, positive bowel sounds.  No masses. Skin: No rashes. Neurologic: Facial musculature symmetric. Psychiatric: Patient is appropriate throughout our interaction. Lymphatic: No cervical lymphadenopathy Musculoskeletal: Gait intact.   LABS: Results for orders placed in visit on 07/28/13  POCT RAPID STREP A (OFFICE)      Result Value Range   Rapid Strep A Screen Negative  Negative     EKG/XRAY:   Primary read interpreted by Dr. Conley Rolls at [facility].   ASSESSMENT/PLAN: Encounter Diagnoses  Name Primary?  . Laryngitis Yes  . HTN (hypertension)   . Erectile dysfunction   . Hypokalemia    1. Refilled K since he never got it filled the last time he saw Dr Patsy Lager 5 days ago. His K was low at 3.0. NEEDS to take Potassium 2. Monitor laryngitits, stop drinking whiskey with tea and honey, try otc throat lozenges 3. Trial of viagra 4. Advise to recheck labs in 1 week , get K refileld and start taking it, he has a future order from Dr Patsy Lager for CBC, BMP Gross sideeffects, risk and benefits, and alternatives of medications d/w patient. Patient is  aware that all medications have potential sideeffects and we are unable to predict every sideeffect or drug-drug interaction that may occur.  Hamilton Capri PHUONG, DO 07/30/2013 2:48 PM

## 2013-08-01 ENCOUNTER — Telehealth: Payer: Self-pay

## 2013-08-01 DIAGNOSIS — I10 Essential (primary) hypertension: Secondary | ICD-10-CM

## 2013-08-01 MED ORDER — POTASSIUM CHLORIDE CRYS ER 20 MEQ PO TBCR: EXTENDED_RELEASE_TABLET | ORAL | Status: DC

## 2013-08-01 NOTE — Telephone Encounter (Signed)
I spoke w/pharmacist and explained new Rx and that Dr Patsy Lager would call pt to discuss, and that we are rechecking K+ level again to determine correct dosage. He reported that the pt works 3rd shift at [employer] and he is very difficult to get in touch with because he is sleeping during the day. He has put notes in their system and we can [facility] for pt if we can not reach.

## 2013-08-01 NOTE — Telephone Encounter (Signed)
Called and discussed with him.  He is not aware of ever being on 20 meq twice a day.  He has started back on his K and will come and see [place] this week for a recheck K level.

## 2013-08-01 NOTE — Telephone Encounter (Signed)
Pharm called to get clarification on sig for K+ Rx. Pt states he is supposed to take it BID and this is how he has been taking it, although Rx is written for QD. What is happening is that pt is running out early and then he will be out and not take any until he gets his next RF. I spoke w/Dr Copland and we are currently in process of rechecking pt's K+ level to determine correct dosage. Sending in new Rx as instr's by Dr Patsy Lager, 1 tablet QD - BID as directed, and she will call pt to discuss.

## 2013-08-21 ENCOUNTER — Other Ambulatory Visit: Payer: Self-pay | Admitting: Family Medicine

## 2013-08-21 ENCOUNTER — Ambulatory Visit (INDEPENDENT_AMBULATORY_CARE_PROVIDER_SITE_OTHER): Payer: Managed Care, Other (non HMO) | Admitting: Family Medicine

## 2013-08-21 VITALS — BP 118/76 | HR 69 | Temp 97.6°F | Resp 16 | Ht 67.5 in | Wt 232.0 lb

## 2013-08-21 DIAGNOSIS — I10 Essential (primary) hypertension: Secondary | ICD-10-CM

## 2013-08-21 DIAGNOSIS — D649 Anemia, unspecified: Secondary | ICD-10-CM

## 2013-08-21 DIAGNOSIS — E785 Hyperlipidemia, unspecified: Secondary | ICD-10-CM

## 2013-08-21 DIAGNOSIS — Z79899 Other long term (current) drug therapy: Secondary | ICD-10-CM

## 2013-08-21 DIAGNOSIS — R609 Edema, unspecified: Secondary | ICD-10-CM

## 2013-08-21 LAB — POCT CBC
HCT, POC: 45 % (ref 43.5–53.7)
Hemoglobin: 13.6 g/dL — AB (ref 14.1–18.1)
Lymph, poc: 0.8 (ref 0.6–3.4)
MCH, POC: 29.3 pg (ref 27–31.2)
MCHC: 30.2 g/dL — AB (ref 31.8–35.4)
MCV: 97 fL (ref 80–97)
MPV: 8.3 fL (ref 0–99.8)
POC Granulocyte: 2.1 (ref 2–6.9)
RBC: 4.64 M/uL — AB (ref 4.69–6.13)
WBC: 3.4 10*3/uL — AB (ref 4.6–10.2)

## 2013-08-21 LAB — BASIC METABOLIC PANEL
BUN: 18 mg/dL (ref 6–23)
CO2: 23 mEq/L (ref 19–32)
Calcium: 9.3 mg/dL (ref 8.4–10.5)
Chloride: 103 mEq/L (ref 96–112)
Creat: 1.56 mg/dL — ABNORMAL HIGH (ref 0.50–1.35)
Glucose, Bld: 101 mg/dL — ABNORMAL HIGH (ref 70–99)
Potassium: 3.9 mEq/L (ref 3.5–5.3)

## 2013-08-21 MED ORDER — PRAVASTATIN SODIUM 80 MG PO TABS: 80.0000 mg | ORAL_TABLET | Freq: Every evening | ORAL | Status: DC | PRN

## 2013-08-21 NOTE — Progress Notes (Addendum)
Subjective:    Patient ID: Wesley Mccarthy, male    DOB: [birth year], 62 y.o.   MRN: [id number]  This chart was scribed for Sherren Mocha, MD by Dorothey Baseman, ED Scribe.   Chief Complaint  Patient presents with  . Follow-up    Labs  . Medication Refill    lisinopril, pravachol, potassium   HPI Wesley Mccarthy is a 62 y.o. male who presents to [facility] requesting a refill of prescriptions for lisinopril, Pravachol, and potassium. Patient was seen here by Dr. Dallas Schimke and had labs drawn <1 mo prev which showed a potassium of 3 and a creatinine of 1.48 that was elevated from his baseline of 1.2. He had been out of his potassium supplement for 3-4 days so he was instructed to stop his lasix and to resume his potassium for 3-4 days. His lipid panel also showed triglycerides of 335 and a non-HDL cholesterol of 140, so his pravastatin was increased from 40 to 80 mg.  He denies any issues with the medications including arthralgias, myalgias, palpitations, or leg swelling.  Patient reports that he has taken his medications this morning and has not eaten.   Past Medical History  Diagnosis Date  . Seizures   . Arthritis   . Allergy   . Hypertension    Current Outpatient Prescriptions on File Prior to Visit  Medication Sig Dispense Refill  . allopurinol (ZYLOPRIM) 300 MG tablet Take 1 tablet (300 mg total) by mouth daily.  90 tablet  3  . benzonatate (TESSALON) 100 MG capsule TAKE 1 OR 2 CAPSULES 3 TIMES DAILY AS NECESSARY FOR COUGH. MAY BE USED WITH OTHER COUGH MEDICINES IF NEEDED.  30 capsule  0  . fluticasone (FLONASE) 50 MCG/ACT nasal spray Place 2 sprays into the nose daily.  16 g  6  . furosemide (LASIX) 20 MG tablet Take 1 tablet (20 mg total) by mouth daily.  90 tablet  3  . hydrochlorothiazide (MICROZIDE) 12.5 MG capsule Take 1 capsule (12.5 mg total) by mouth every morning.  90 capsule  3  . lisinopril (PRINIVIL,ZESTRIL) 40 MG tablet Take 1 tablet (40 mg total) by mouth daily.   90 tablet  3  . naproxen sodium (ANAPROX) 550 MG tablet TAKE 1 TABLET (550 MG TOTAL) BY MOUTH 2 (TWO) TIMES DAILY WITH A MEAL.  60 tablet  2  . PHENobarbital (LUMINAL) 97.2 MG tablet Take 1 tablet (97.2 mg total) by mouth daily.  90 tablet  3  . potassium chloride SA (K-DUR,KLOR-CON) 20 MEQ tablet Take 1 tablet once or twice daily as directed.  180 tablet  3  . pravastatin (PRAVACHOL) 40 MG tablet Take 1 tablet (40 mg total) by mouth daily.  90 tablet  3  . sildenafil (VIAGRA) 100 MG tablet Take 0.5-1 tablets (50-100 mg total) by mouth daily as needed for erectile dysfunction.  5 tablet  3  . aspirin EC 81 MG tablet Take 81 mg by mouth daily.        . fluticasone (FLONASE) 50 MCG/ACT nasal spray Place 2 sprays into the nose as needed.      . loratadine (CLARITIN) 10 MG tablet Take 1 tablet (10 mg total) by mouth daily.  30 tablet  11  . [DISCONTINUED] potassium chloride (KLOR-CON) 20 MEQ packet Take 20 mEq by mouth 2 (two) times daily.  30 packet  5   No current facility-administered medications on file prior to visit.   Allergies  Allergen Reactions  .  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  Marland Kitchen Penicillins       Review of Systems  Constitutional: Negative for fever and chills.  Eyes: Negative for visual disturbance.  Respiratory: Negative for shortness of breath.   Cardiovascular: Negative for chest pain, palpitations and leg swelling.  Musculoskeletal: Negative for arthralgias and myalgias.  Neurological: Negative for dizziness, syncope, facial asymmetry, weakness, light-headedness and headaches.      BP 118/76  Pulse 69  Temp(Src) 97.6 F (36.4 C) (Oral)  Resp 16  Ht 5' 7.5" (1.715 m)  Wt 232 lb (105.235 kg)  BMI 35.78 kg/m2  SpO2 97% Objective:   Physical Exam  Nursing note and vitals reviewed. Constitutional: He is oriented to person, place, and time. He appears well-developed and well-nourished. No distress.  HENT:  Head:  Normocephalic and atraumatic.  Eyes: Conjunctivae are normal.  Neck: Normal range of motion. Neck supple.  Cardiovascular: Normal rate, regular rhythm and normal heart sounds.  Exam reveals no gallop and no friction rub.   No murmur heard. Pulmonary/Chest: Effort normal and breath sounds normal. No respiratory distress. He has no wheezes. He has no rales.  Abdominal: He exhibits no distension.  Musculoskeletal: Normal range of motion.  Neurological: He is alert and oriented to person, place, and time.  Skin: Skin is warm and dry.  Psychiatric: He has a normal mood and affect. His behavior is normal.   Results for orders placed in visit on 08/21/13  BASIC METABOLIC PANEL      Result Value Range   Sodium 137  135 - 145 mEq/L   Potassium 3.9  3.5 - 5.3 mEq/L   Chloride 103  96 - 112 mEq/L   CO2 23  19 - 32 mEq/L   Glucose, Bld 101 (*) 70 - 99 mg/dL   BUN 18  6 - 23 mg/dL   Creat 9.14 (*) 7.82 - 1.35 mg/dL   Calcium 9.3  8.4 - 95.6 mg/dL  POCT CBC      Result Value Range   WBC 3.4 (*) 4.6 - 10.2 K/uL   Lymph, poc 0.8  0.6 - 3.4   POC LYMPH PERCENT 23.9  10 - 50 %L   MID (cbc) 0.5  0 - 0.9   POC MID % 13.8 (*) 0 - 12 %M   POC Granulocyte 2.1  2 - 6.9   Granulocyte percent 62.3  37 - 80 %G   RBC 4.64 (*) 4.69 - 6.13 M/uL   Hemoglobin 13.6 (*) 14.1 - 18.1 g/dL   HCT, POC 21.3  08.6 - 53.7 %   MCV 97.0  80 - 97 fL   MCH, POC 29.3  27 - 31.2 pg   MCHC 30.2 (*) 31.8 - 35.4 g/dL   RDW, POC 57.8     Platelet Count, POC 170  142 - 424 K/uL   MPV 8.3  0 - 99.8 fL      Assessment & Plan:   Encounter for long-term (current) use of other medications - Plan: POCT CBC, Basic metabolic panel - prior baseline cr was 1.2 but sev wks ago bounced to 1.4 - recheck today and cont to be elev at 1.56.  Have pt push fluids, comply w/ bp meds, and recheck in 3 mos.  HTN (hypertension) - Plan: POCT CBC, Basic metabolic panel - BP is well controlled and has been on same BP meds for >1 yr - lisinopril  40, hctz 12.5, lasix  20 w/ K 20 (unclear if pt is taking qd or bid)  Hyperlipidemia - Plan: pravastatin (PRAVACHOL) 80 MG tablet - Pt has been on increased pravastatin dose for > 1 mo - will check lfts since statin increased from 40 to 80.   Gout - on allopurinol - cons checking uric acid w/ next labs.  Anemia - mild and stable, w/ new mild leukopenia - unknown etiology - rec f/u w/ PCP to ensure up to date on colonoscopy, consider iron studies, retic, etc. . .  Pt has plenty of refills on all other meds.  Rec f/u in 3-4 mos for repeat flp since statin increased, and recheck renal function w/ urine microalb since cr has bumped in past yr - may have developed some renal failure so would need to discuss limiting nsaids. Meds ordered this encounter  Medications  . pravastatin (PRAVACHOL) 80 MG tablet    Sig: Take 1 tablet (80 mg total) by mouth at bedtime and may repeat dose one time if needed.    Dispense:  90 tablet    Refill:  1    I personally performed the services described in this documentation, which was scribed in my presence. The recorded information has been reviewed and considered, and addended by me as needed.  Norberto Sorenson, MD MPH

## 2013-08-22 ENCOUNTER — Encounter: Payer: Self-pay | Admitting: Family Medicine

## 2013-08-22 DIAGNOSIS — D649 Anemia, unspecified: Secondary | ICD-10-CM | POA: Insufficient documentation

## 2013-08-22 LAB — HEPATIC FUNCTION PANEL
ALT: 15 U/L (ref 0–53)
AST: 24 U/L (ref 0–37)
Albumin: 4.1 g/dL (ref 3.5–5.2)
Alkaline Phosphatase: 121 U/L — ABNORMAL HIGH (ref 39–117)
Indirect Bilirubin: 0.4 mg/dL (ref 0.0–0.9)
Total Protein: 7.1 g/dL (ref 6.0–8.3)

## 2013-11-08 ENCOUNTER — Ambulatory Visit (INDEPENDENT_AMBULATORY_CARE_PROVIDER_SITE_OTHER): Payer: Managed Care, Other (non HMO) | Admitting: Emergency Medicine

## 2013-11-08 VITALS — BP 112/90 | HR 83 | Temp 98.0°F | Resp 16 | Ht 71.0 in | Wt 233.0 lb

## 2013-11-08 DIAGNOSIS — J209 Acute bronchitis, unspecified: Secondary | ICD-10-CM

## 2013-11-08 DIAGNOSIS — J018 Other acute sinusitis: Secondary | ICD-10-CM

## 2013-11-08 MED ORDER — LEVOFLOXACIN 500 MG PO TABS: 500.0000 mg | ORAL_TABLET | Freq: Every day | ORAL | Status: AC

## 2013-11-08 MED ORDER — GUAIFENESIN-DM 100-10 MG/5ML PO SYRP: 5.0000 mL | ORAL_SOLUTION | ORAL | Status: DC | PRN

## 2013-11-08 MED ORDER — PSEUDOEPHEDRINE-GUAIFENESIN ER 60-600 MG PO TB12: 1.0000 | ORAL_TABLET | Freq: Two times a day (BID) | ORAL | Status: DC

## 2013-11-08 NOTE — Progress Notes (Signed)
[facility] [address], [place] [address] [phone]  Date:  11/08/2013   Name:  Wesley Mccarthy   DOB:  [birth year]   MRN:  [id number]  PCP:  Ruben Reason, MD    Chief Complaint: Cough   History of Present Illness:  Wesley Mccarthy is a 63 y.o. very pleasant male patient who presents with the following:  Ill since Saturday with cough productive of scant mucopurulent sputum.  No wheezing or shortness of breath.  No nausea or vomiting.  No stool change.  Some nasal congestion and nasal drainage preceding the cough but mostly mucoid.  No fever or chills.  No nausea or vomiting.  No stool change or rash.  No improvement with over the counter medications or other home remedies. Denies other complaint or health concern today.   Patient Active Problem List   Diagnosis Date Noted  . Anemia 08/22/2013  . 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 Stasis dermatitis of both legs 12/21/2012  . Edema 12/21/2012  . Seizure disorder 08/26/2012  . HTN (hypertension) 11/11/2011  . Hyperlipemia 11/11/2011  . Gout 11/11/2011  . Obesity 11/11/2011  . Health care maintenance 11/11/2011    Past Medical History  Diagnosis Date  . Seizures   . Arthritis   . Allergy   . Hypertension   . Hyperlipidemia     Past Surgical History  Procedure Laterality Date  . Appendectomy    . Tonsillectomy      History  Substance Use Topics  . Smoking status: Never Smoker   . Smokeless tobacco: Not on file  . Alcohol Use: No    Family History  Problem Relation Age of Onset  . Heart disease Mother     Had a CABG  . Cancer Father     colon cancer    Allergies  Allergen Reactions  . 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  Marland Kitchen Penicillins     Medication list has been reviewed and updated.  Current Outpatient Prescriptions on File Prior to Visit  Medication Sig Dispense Refill  . allopurinol (ZYLOPRIM) 300 MG tablet Take 1 tablet (300 mg total) by  mouth daily.  90 tablet  3  . aspirin EC 81 MG tablet Take 81 mg by mouth daily.        . fluticasone (FLONASE) 50 MCG/ACT nasal spray Place 2 sprays into the nose daily.  16 g  6  . furosemide (LASIX) 20 MG tablet Take 1 tablet (20 mg total) by mouth daily.  90 tablet  3  . hydrochlorothiazide (MICROZIDE) 12.5 MG capsule Take 1 capsule (12.5 mg total) by mouth every morning.  90 capsule  3  . lisinopril (PRINIVIL,ZESTRIL) 40 MG tablet Take 1 tablet (40 mg total) by mouth daily.  90 tablet  3  . naproxen sodium (ANAPROX) 550 MG tablet TAKE 1 TABLET (550 MG TOTAL) BY MOUTH 2 (TWO) TIMES DAILY WITH A MEAL.  60 tablet  2  . PHENobarbital (LUMINAL) 97.2 MG tablet Take 1 tablet (97.2 mg total) by mouth daily.  90 tablet  3  . potassium chloride SA (K-DUR,KLOR-CON) 20 MEQ tablet Take 1 tablet once or twice daily as directed.  180 tablet  3  . pravastatin (PRAVACHOL) 80 MG tablet Take 1 tablet (80 mg total) by mouth at bedtime and may repeat dose one time if needed.  90 tablet  1  . sildenafil (VIAGRA) 100 MG tablet Take 0.5-1 tablets (50-100  mg total) by mouth daily as needed for erectile dysfunction.  5 tablet  3  . fluticasone (FLONASE) 50 MCG/ACT nasal spray Place 2 sprays into the nose as needed.      . loratadine (CLARITIN) 10 MG tablet Take 1 tablet (10 mg total) by mouth daily.  30 tablet  11  . [DISCONTINUED] potassium chloride (KLOR-CON) 20 MEQ packet Take 20 mEq by mouth 2 (two) times daily.  30 packet  5   No current facility-administered medications on file prior to visit.    Review of Systems:  As per HPI, otherwise negative.    Physical Examination: Filed Vitals:   11/08/13 1342  BP: 112/90  Pulse: 83  Temp: 98 F (36.7 C)  Resp: 16   Filed Vitals:   11/08/13 1342  Height: 5\' 11"  (1.803 m)  Weight: 233 lb (105.688 kg)   Body mass index is 32.51 kg/(m^2). Ideal Body Weight: Weight in (lb) to have BMI = 25: 178.9  GEN: WDWN, NAD, Non-toxic, A & O x 3 HEENT: Atraumatic,  Normocephalic. Neck supple. No masses, No LAD. Ears and Nose: No external deformity.  Purulent nasal drainage CV: RRR, No M/G/R. No JVD. No thrill. No extra heart sounds. PULM: CTA B, no wheezes, crackles, rhonchi. No retractions. No resp. distress. No accessory muscle use. ABD: S, NT, ND, +BS. No rebound. No HSM. EXTR: No c/c/e NEURO Normal gait.  PSYCH: Normally interactive. Conversant. Not depressed or anxious appearing.  Calm demeanor.    Assessment and Plan: Sinusitis Bronchitis augmentin mucinex d Robitussin dm  Signed,  Ellison Carwin, MD

## 2013-12-15 ENCOUNTER — Ambulatory Visit (INDEPENDENT_AMBULATORY_CARE_PROVIDER_SITE_OTHER): Payer: Managed Care, Other (non HMO)

## 2013-12-16 ENCOUNTER — Ambulatory Visit (INDEPENDENT_AMBULATORY_CARE_PROVIDER_SITE_OTHER): Payer: Managed Care, Other (non HMO) | Admitting: Physician Assistant

## 2013-12-16 VITALS — BP 136/82 | HR 84 | Temp 98.5°F | Resp 18 | Ht 68.5 in | Wt 228.0 lb

## 2013-12-16 DIAGNOSIS — R059 Cough, unspecified: Secondary | ICD-10-CM

## 2013-12-16 DIAGNOSIS — J329 Chronic sinusitis, unspecified: Secondary | ICD-10-CM

## 2013-12-16 DIAGNOSIS — R05 Cough: Secondary | ICD-10-CM

## 2013-12-16 MED ORDER — HYDROCOD POLST-CHLORPHEN POLST 10-8 MG/5ML PO LQCR: 5.0000 mL | Freq: Two times a day (BID) | ORAL | Status: AC | PRN

## 2013-12-16 MED ORDER — CLARITHROMYCIN ER 500 MG PO TB24: 1000.0000 mg | ORAL_TABLET | Freq: Every day | ORAL | Status: DC

## 2013-12-16 MED ORDER — IPRATROPIUM BROMIDE 0.03 % NA SOLN: 2.0000 | Freq: Two times a day (BID) | NASAL | Status: DC

## 2013-12-16 NOTE — Patient Instructions (Signed)
Take claritin or zyrtec daily. Take mucinex as per box instructions.   Sinusitis Sinusitis is redness, soreness, and swelling (inflammation) of the paranasal sinuses. Paranasal sinuses are air pockets within the bones of your face (beneath the eyes, the middle of the forehead, or above the eyes). In healthy paranasal sinuses, mucus is able to drain out, and air is able to circulate through them by way of your nose. However, when your paranasal sinuses are inflamed, mucus and air can become trapped. This can allow bacteria and other germs to grow and cause infection. Sinusitis can develop quickly and last only a short time (acute) or continue over a long period (chronic). Sinusitis that lasts for more than 12 weeks is considered chronic.  CAUSES  Causes of sinusitis include:  Allergies.  Structural abnormalities, such as displacement of the cartilage that separates your nostrils (deviated septum), which can decrease the air flow through your nose and sinuses and affect sinus drainage.  Functional abnormalities, such as when the small hairs (cilia) that line your sinuses and help remove mucus do not work properly or are not present. SYMPTOMS  Symptoms of acute and chronic sinusitis are the same. The primary symptoms are pain and pressure around the affected sinuses. Other symptoms include:  Upper toothache.  Earache.  Headache.  Bad breath.  Decreased sense of smell and taste.  A cough, which worsens when you are lying flat.  Fatigue.  Fever.  Thick drainage from your nose, which often is green and may contain pus (purulent).  Swelling and warmth over the affected sinuses. DIAGNOSIS  Your caregiver will perform a physical exam. During the exam, your caregiver may:  Look in your nose for signs of abnormal growths in your nostrils (nasal polyps).  Tap over the affected sinus to check for signs of infection.  View the inside of your sinuses (endoscopy) with a special imaging  device with a light attached (endoscope), which is inserted into your sinuses. If your caregiver suspects that you have chronic sinusitis, one or more of the following tests may be recommended:  Allergy tests.  Nasal culture A sample of mucus is taken from your nose and sent to a lab and screened for bacteria.  Nasal cytology A sample of mucus is taken from your nose and examined by your caregiver to determine if your sinusitis is related to an allergy. TREATMENT  Most cases of acute sinusitis are related to a viral infection and will resolve on their own within 10 days. Sometimes medicines are prescribed to help relieve symptoms (pain medicine, decongestants, nasal steroid sprays, or saline sprays).  However, for sinusitis related to a bacterial infection, your caregiver will prescribe antibiotic medicines. These are medicines that will help kill the bacteria causing the infection.  Rarely, sinusitis is caused by a fungal infection. In theses cases, your caregiver will prescribe antifungal medicine. For some cases of chronic sinusitis, surgery is needed. Generally, these are cases in which sinusitis recurs more than 3 times per year, despite other treatments. HOME CARE INSTRUCTIONS   Drink plenty of water. Water helps thin the mucus so your sinuses can drain more easily.  Use a humidifier.  Inhale steam 3 to 4 times a day (for example, sit in the bathroom with the shower running).  Apply a warm, moist washcloth to your face 3 to 4 times a day, or as directed by your caregiver.  Use saline nasal sprays to help moisten and clean your sinuses.  Take over-the-counter or prescription medicines for  pain, discomfort, or fever only as directed by your caregiver. SEEK IMMEDIATE MEDICAL CARE IF:  You have increasing pain or severe headaches.  You have nausea, vomiting, or drowsiness.  You have swelling around your face.  You have vision problems.  You have a stiff neck.  You have  difficulty breathing. MAKE SURE YOU:   Understand these instructions.  Will watch your condition.  Will get help right away if you are not doing well or get worse. Document Released: 09/21/2005 Document Revised: 12/14/2011 Document Reviewed: 10/06/2011 [facility] Patient Information 2014 [place], [place].

## 2013-12-16 NOTE — Progress Notes (Signed)
   Subjective:    Patient ID: Wesley Mccarthy, male    DOB: 07/04/1951, 63 y.o.   MRN: [id number]  HPI  Patient with 1 1/2 week history of non-productive cough that keeps him awake at night, nasal congestion, runny nose with clear mucus, chest congestion.  He has been using cough drops and taking OTC cough syrup.  He reports fatigue, sweats, post nasal drip.  He denies fever, chills, aching, eye discharge, ear pain/pressure, sinus pain/pressure, shortness of breath, and wheezing.    Review of Systems  Constitutional: Positive for fatigue. Negative for fever and chills.  HENT: Positive for congestion, postnasal drip and rhinorrhea. Negative for ear pain, hearing loss, sinus pressure and sore throat.   Eyes: Negative.   Respiratory: Positive for cough. Negative for shortness of breath and wheezing.   Musculoskeletal: Negative for myalgias.      Objective:   Physical Exam  Constitutional: He is oriented to person, place, and time. He appears well-developed and well-nourished. No distress.  HENT:  Head: Normocephalic and atraumatic.  Right Ear: Tympanic membrane normal.  Left Ear: Tympanic membrane normal.  Nose: Mucosal edema and rhinorrhea present. Right sinus exhibits no maxillary sinus tenderness and no frontal sinus tenderness. Left sinus exhibits no maxillary sinus tenderness and no frontal sinus tenderness.  Mouth/Throat: Oropharynx is clear and moist. No oropharyngeal exudate.  Eyes: Conjunctivae and EOM are normal. Pupils are equal, round, and reactive to light. Right eye exhibits no discharge. Left eye exhibits no discharge. No scleral icterus.  Neck: Normal range of motion. Neck supple. No thyromegaly present.  Cardiovascular: Normal rate, regular rhythm and normal heart sounds.  Exam reveals no gallop and no friction rub.   No murmur heard. Pulmonary/Chest: Effort normal and breath sounds normal. No respiratory distress. He has no wheezes. He has no rhonchi. He has no rales.    Lymphadenopathy:       Head (right side): No submental, no submandibular, no tonsillar, no preauricular, no posterior auricular and no occipital adenopathy present.       Head (left side): No submental, no submandibular, no tonsillar, no preauricular, no posterior auricular and no occipital adenopathy present.    He has no cervical adenopathy.  Neurological: He is alert and oriented to person, place, and time.  Skin: Skin is warm and dry.  Psychiatric: He has a normal mood and affect. His behavior is normal. Judgment and thought content normal.      Assessment & Plan:   1. Sinusitis Likely sinusitis with 1 1/2 week URI. - clarithromycin (BIAXIN XL) 500 MG 24 hr tablet; Take 2 tablets (1,000 mg total) by mouth daily.  Dispense: 20 tablet; Refill: 0 - ipratropium (ATROVENT) 0.03 % nasal spray; Place 2 sprays into both nostrils 2 (two) times daily.  Dispense: 30 mL; Refill: 0  2. Cough Likely due to PND. - chlorpheniramine-HYDROcodone (TUSSIONEX PENNKINETIC ER) 10-8 MG/5ML LQCR; Take 5 mLs by mouth every 12 (twelve) hours as needed for cough (cough).  Dispense: 115 mL; Refill: 0

## 2013-12-16 NOTE — Progress Notes (Signed)
I have examined this patient along with the student and agree.  

## 2014-04-16 ENCOUNTER — Ambulatory Visit (INDEPENDENT_AMBULATORY_CARE_PROVIDER_SITE_OTHER): Payer: Managed Care, Other (non HMO) | Admitting: Family Medicine

## 2014-04-16 VITALS — BP 130/88 | HR 66 | Temp 97.7°F | Resp 18 | Ht 68.0 in | Wt 228.4 lb

## 2014-04-16 DIAGNOSIS — R609 Edema, unspecified: Secondary | ICD-10-CM

## 2014-04-16 DIAGNOSIS — I10 Essential (primary) hypertension: Secondary | ICD-10-CM

## 2014-04-16 DIAGNOSIS — E785 Hyperlipidemia, unspecified: Secondary | ICD-10-CM

## 2014-04-16 LAB — COMPREHENSIVE METABOLIC PANEL WITH GFR
ALT: 15 U/L (ref 0–53)
AST: 23 U/L (ref 0–37)
Albumin: 4.3 g/dL (ref 3.5–5.2)
Alkaline Phosphatase: 142 U/L — ABNORMAL HIGH (ref 39–117)
BUN: 19 mg/dL (ref 6–23)
CO2: 25 meq/L (ref 19–32)
Calcium: 9.2 mg/dL (ref 8.4–10.5)
Chloride: 104 meq/L (ref 96–112)
Creat: 1.41 mg/dL — ABNORMAL HIGH (ref 0.50–1.35)
Glucose, Bld: 80 mg/dL (ref 70–99)
Potassium: 4.6 meq/L (ref 3.5–5.3)
Sodium: 138 meq/L (ref 135–145)
Total Bilirubin: 0.4 mg/dL (ref 0.2–1.2)
Total Protein: 7.4 g/dL (ref 6.0–8.3)

## 2014-04-16 LAB — CBC WITH DIFFERENTIAL/PLATELET
BASOS ABS: 0.1 10*3/uL (ref 0.0–0.1)
Basophils Relative: 1 % (ref 0–1)
EOS ABS: 0.6 10*3/uL (ref 0.0–0.7)
Eosinophils Relative: 10 % — ABNORMAL HIGH (ref 0–5)
HCT: 39.1 % (ref 39.0–52.0)
Hemoglobin: 14 g/dL (ref 13.0–17.0)
Lymphocytes Relative: 29 % (ref 12–46)
Lymphs Abs: 1.7 10*3/uL (ref 0.7–4.0)
MCH: 30.6 pg (ref 26.0–34.0)
MCHC: 35.8 g/dL (ref 30.0–36.0)
MCV: 85.6 fL (ref 78.0–100.0)
Monocytes Absolute: 0.4 10*3/uL (ref 0.1–1.0)
Monocytes Relative: 7 % (ref 3–12)
NEUTROS PCT: 53 % (ref 43–77)
Neutro Abs: 3 10*3/uL (ref 1.7–7.7)
PLATELETS: 186 10*3/uL (ref 150–400)
RBC: 4.57 MIL/uL (ref 4.22–5.81)
RDW: 13.2 % (ref 11.5–15.5)
WBC: 5.7 10*3/uL (ref 4.0–10.5)

## 2014-04-16 LAB — LIPID PANEL
Cholesterol: 176 mg/dL (ref 0–200)
HDL: 37 mg/dL — ABNORMAL LOW
LDL Cholesterol: 90 mg/dL (ref 0–99)
Total CHOL/HDL Ratio: 4.8 ratio
Triglycerides: 247 mg/dL — ABNORMAL HIGH
VLDL: 49 mg/dL — ABNORMAL HIGH (ref 0–40)

## 2014-04-16 NOTE — Patient Instructions (Signed)

## 2014-04-16 NOTE — Progress Notes (Signed)
   Subjective:    Patient ID: Wesley Mccarthy, male    DOB: 1950-11-28, 63 y.o.   MRN: [id number]  HPI This is a very pleasant 63 yo male who presents today with swelling in both of his legs for 2 days. 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 Has noticed some discoloration on both legs, worse on the left. Has history of stasis dermatitis on both legs. He reports eating several particularly salty meals in the last couple of days.  Usually takes medications in the morning, has had his morning meds today.  Patient Active Problem List   Diagnosis Date Noted  . Anemia 08/22/2013  . 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 Stasis dermatitis of both legs 12/21/2012  . Edema 12/21/2012  . Seizure disorder 08/26/2012  . HTN (hypertension) 11/11/2011  . Hyperlipemia 11/11/2011  . Gout 11/11/2011  . Obesity 11/11/2011  . Health care maintenance 11/11/2011     Review of Systems No chest pain, no SOB, no sleep disturbance.    Objective:   Physical Exam  Vitals reviewed. Constitutional: He is oriented to person, place, and time. He appears well-developed and well-nourished.  HENT:  Head: Normocephalic and atraumatic.  Eyes: Conjunctivae are normal.  Neck: Normal range of motion. Neck supple.  Cardiovascular: Normal rate, regular rhythm, normal heart sounds and intact distal pulses.   Pulmonary/Chest: Effort normal and breath sounds normal.  Musculoskeletal: Normal range of motion. Edema: trace pretibial edema bilaterally.  Neurological: He is alert and oriented to person, place, and time.  Skin: Skin is warm and dry. Rash: scattered erythematous macules on bilateral lower legs.  Psychiatric: He has a normal mood and affect. His behavior is normal. Judgment and thought content normal.      Assessment & Plan:  1. Essential hypertension - CBC with Differential - Comprehensive metabolic panel -blood pressure WNL today  2. Edema - CBC  with Differential - Comprehensive metabolic panel -patient currently on lasix and HCTZ -encouraged elevation of legs, support socks  3. Hyperlipidemia - CBC with Differential - Comprehensive metabolic panel - Lipid panel  -Follow up CPE 2-3 weeks, RTC sooner if no improvement in edema  Elby Beck, FNP-BC  [facility]  04/16/2014 10:51 PM

## 2014-04-17 ENCOUNTER — Telehealth: Payer: Self-pay

## 2014-04-17 ENCOUNTER — Encounter: Payer: Self-pay | Admitting: Radiology

## 2014-05-12 ENCOUNTER — Other Ambulatory Visit: Payer: Self-pay | Admitting: Family Medicine

## 2014-05-14 NOTE — Telephone Encounter (Signed)
Debbie, you just saw pt for check up but don't see this med discussed. Do you want to RF?

## 2014-06-15 ENCOUNTER — Other Ambulatory Visit: Payer: Self-pay | Admitting: Family Medicine

## 2014-06-21 ENCOUNTER — Ambulatory Visit (INDEPENDENT_AMBULATORY_CARE_PROVIDER_SITE_OTHER): Payer: Managed Care, Other (non HMO) | Admitting: Family Medicine

## 2014-06-21 VITALS — BP 158/64 | HR 62 | Temp 97.7°F | Resp 16 | Ht 68.0 in | Wt 230.0 lb

## 2014-06-21 DIAGNOSIS — I10 Essential (primary) hypertension: Secondary | ICD-10-CM

## 2014-06-21 DIAGNOSIS — J069 Acute upper respiratory infection, unspecified: Secondary | ICD-10-CM

## 2014-06-21 NOTE — Patient Instructions (Signed)
Saline nasal spray atleast 4 times per day for congestion, over the counter mucinex or mucinex DM for cough, but avoid other decongestants and over the counter cold medicines as these are likely affecting your blood pressure. Drink plenty of fluids.  Check your blood pressure in next few days to make sure levels improve off the other cold medicine. Return to the clinic or go to the nearest emergency room if any of your symptoms worsen or new symptoms occur.  Upper Respiratory Infection, Adult An upper respiratory infection (URI) is also sometimes known as the common cold. The upper respiratory tract includes the nose, sinuses, throat, trachea, and bronchi. Bronchi are the airways leading to the lungs. Most people improve within 1 week, but symptoms can last up to 2 weeks. A residual cough may last even longer.  CAUSES Many different viruses can infect the tissues lining the upper respiratory tract. The tissues become irritated and inflamed and often become very moist. Mucus production is also common. A cold is contagious. You can easily spread the virus to others by oral contact. This includes kissing, sharing a glass, coughing, or sneezing. Touching your mouth or nose and then touching a surface, which is then touched by another person, can also spread the virus. SYMPTOMS  Symptoms typically develop 1 to 3 days after you come in contact with a cold virus. Symptoms vary from person to person. They may include:  Runny nose.  Sneezing.  Nasal congestion.  Sinus irritation.  Sore throat.  Loss of voice (laryngitis).  Cough.  Fatigue.  Muscle aches.  Loss of appetite.  Headache.  Low-grade fever. DIAGNOSIS  You might diagnose your own cold based on familiar symptoms, since most people get a cold 2 to 3 times a year. Your caregiver can confirm this based on your exam. Most importantly, your caregiver can check that your symptoms are not due to another disease such as strep throat,  sinusitis, pneumonia, asthma, or epiglottitis. Blood tests, throat tests, and X-rays are not necessary to diagnose a common cold, but they may sometimes be helpful in excluding other more serious diseases. Your caregiver will decide if any further tests are required. RISKS AND COMPLICATIONS  You may be at risk for a more severe case of the common cold if you smoke cigarettes, have chronic heart disease (such as heart failure) or lung disease (such as asthma), or if you have a weakened immune system. The very young and very old are also at risk for more serious infections. Bacterial sinusitis, middle ear infections, and bacterial pneumonia can complicate the common cold. The common cold can worsen asthma and chronic obstructive pulmonary disease (COPD). Sometimes, these complications can require emergency medical care and may be life-threatening. PREVENTION  The best way to protect against getting a cold is to practice good hygiene. Avoid oral or hand contact with people with cold symptoms. Wash your hands often if contact occurs. There is no clear evidence that vitamin C, vitamin E, echinacea, or exercise reduces the chance of developing a cold. However, it is always recommended to get plenty of rest and practice good nutrition. TREATMENT  Treatment is directed at relieving symptoms. There is no cure. Antibiotics are not effective, because the infection is caused by a virus, not by bacteria. Treatment may include:  Increased fluid intake. Sports drinks offer valuable electrolytes, sugars, and fluids.  Breathing heated mist or steam (vaporizer or shower).  Eating chicken soup or other clear broths, and maintaining good nutrition.  Getting plenty  of rest.  Using gargles or lozenges for comfort.  Controlling fevers with ibuprofen or acetaminophen as directed by your caregiver.  Increasing usage of your inhaler if you have asthma. Zinc gel and zinc lozenges, taken in the first 24 hours of the common  cold, can shorten the duration and lessen the severity of symptoms. Pain medicines may help with fever, muscle aches, and throat pain. A variety of non-prescription medicines are available to treat congestion and runny nose. Your caregiver can make recommendations and may suggest nasal or lung inhalers for other symptoms.  HOME CARE INSTRUCTIONS   Only take over-the-counter or prescription medicines for pain, discomfort, or fever as directed by your caregiver.  Use a warm mist humidifier or inhale steam from a shower to increase air moisture. This may keep secretions moist and make it easier to breathe.  Drink enough water and fluids to keep your urine clear or pale yellow.  Rest as needed.  Return to work when your temperature has returned to normal or as your caregiver advises. You may need to stay home longer to avoid infecting others. You can also use a face mask and careful hand washing to prevent spread of the virus. SEEK MEDICAL CARE IF:   After the first few days, you feel you are getting worse rather than better.  You need your caregiver's advice about medicines to control symptoms.  You develop chills, worsening shortness of breath, or brown or red sputum. These may be signs of pneumonia.  You develop yellow or brown nasal discharge or pain in the face, especially when you bend forward. These may be signs of sinusitis.  You develop a fever, swollen neck glands, pain with swallowing, or white areas in the back of your throat. These may be signs of strep throat. SEEK IMMEDIATE MEDICAL CARE IF:   You have a fever.  You develop severe or persistent headache, ear pain, sinus pain, or chest pain.  You develop wheezing, a prolonged cough, cough up blood, or have a change in your usual mucus (if you have chronic lung disease).  You develop sore muscles or a stiff neck. Document Released: 03/17/2001 Document Revised: 12/14/2011 Document Reviewed: 12/27/2013 [facility] Patient  Information 2015 [place], [place]. This information is not intended to replace advice given to you by your health care provider. Make sure you discuss any questions you have with your health care provider.

## 2014-06-21 NOTE — Progress Notes (Signed)
Subjective:    Patient ID: Wesley Mccarthy, male    DOB: 04-30-1951, 63 y.o.   MRN: [id number]  HPI Wesley Mccarthy is a 63 y.o. male  Started 3 days ago - sneezing, runny nose and congestion, cough. Coworker with similar symptoms few days prior.  No fever. Cough nonproductive, no dyspnea.   Hx of HTN, no hx of CHF.   Tx: alks seltzer cold plus.   Patient Active Problem List   Diagnosis Date Noted  . Anemia 08/22/2013  . 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 Stasis dermatitis of both legs 12/21/2012  . Edema 12/21/2012  . Seizure disorder 08/26/2012  . HTN (hypertension) 11/11/2011  . Hyperlipemia 11/11/2011  . Gout 11/11/2011  . Obesity 11/11/2011  . Health care maintenance 11/11/2011   Past Medical History  Diagnosis Date  . Seizures   . Arthritis   . Allergy   . Hypertension   . Hyperlipidemia    Past Surgical History  Procedure Laterality Date  . Appendectomy    . Tonsillectomy     Allergies  Allergen Reactions  . 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  Marland Kitchen Penicillins    Prior to Admission medications   Medication Sig Start Date End Date Taking? Authorizing Provider  allopurinol (ZYLOPRIM) 300 MG tablet TAKE 1 TABLET (300 MG TOTAL) BY MOUTH DAILY.   Yes Elby Beck, FNP  furosemide (LASIX) 20 MG tablet TAKE 1 TABLET (20 MG TOTAL) BY MOUTH DAILY. 06/15/14  Yes Elby Beck, FNP  hydrochlorothiazide (MICROZIDE) 12.5 MG capsule Take 1 capsule (12.5 mg total) by mouth every morning. 07/24/13  Yes Jessica C Copland, MD  lisinopril (PRINIVIL,ZESTRIL) 40 MG tablet TAKE 1 TABLET (40 MG TOTAL) BY MOUTH DAILY.   Yes Elby Beck, FNP  loratadine (CLARITIN) 10 MG tablet Take 10 mg by mouth daily as needed. 02/04/12 06/21/14 Yes Elige Radon, MD  PHENobarbital (LUMINAL) 97.2 MG tablet Take 1 tablet (97.2 mg total) by mouth daily. 07/24/13  Yes Gay Filler Copland, MD  potassium chloride SA (K-DUR,KLOR-CON) 20 MEQ tablet Take 1 tablet once  or twice daily as directed. 08/01/13  Yes Jessica C Copland, MD  pravastatin (PRAVACHOL) 40 MG tablet TAKE 1 TABLET (40 MG TOTAL) BY MOUTH DAILY.    Elby Beck, FNP  pravastatin (PRAVACHOL) 80 MG tablet Take 1 tablet (80 mg total) by mouth at bedtime and may repeat dose one time if needed. 08/21/13   Shawnee Knapp, MD   History   Social History  . Marital Status: Married    Spouse Name: N/A    Number of Children: N/A  . Years of Education: N/A   Occupational History  . Not on file.   Social History Main Topics  . Smoking status: Never Smoker   . Smokeless tobacco: Not on file  . Alcohol Use: No  . Drug Use: No  . Sexual Activity: Yes    Birth Control/ Protection: None   Other Topics Concern  . Not on file   Social History Narrative   Married. Education: [employer].        Review of Systems  Constitutional: Negative for fever and chills.  HENT: Positive for congestion and rhinorrhea.   Respiratory: Positive for cough. Negative for shortness of breath.        Objective:   Physical Exam  Vitals reviewed. Constitutional: He is oriented to person, place, and time. He appears well-developed and well-nourished.  HENT:  Head: Normocephalic and atraumatic.  Right Ear: Tympanic membrane, external ear and ear canal normal.  Left Ear: Tympanic membrane, external ear and ear canal normal.  Nose: Rhinorrhea present. Right sinus exhibits no maxillary sinus tenderness and no frontal sinus tenderness. Left sinus exhibits no maxillary sinus tenderness and no frontal sinus tenderness.  Mouth/Throat: Oropharynx is clear and moist and mucous membranes are normal. No oropharyngeal exudate or posterior oropharyngeal erythema.  Eyes: Conjunctivae are normal. Pupils are equal, round, and reactive to light.  Neck: Neck supple.  Cardiovascular: Normal rate, regular rhythm, normal heart sounds and intact distal pulses.   No murmur heard. Pulmonary/Chest: Effort normal and breath sounds  normal. He has no wheezes. He has no rhonchi. He has no rales.  Abdominal: Soft. There is no tenderness.  Musculoskeletal: He exhibits edema (trace pedal, nonpitting. ).  Lymphadenopathy:    He has no cervical adenopathy.  Neurological: He is alert and oriented to person, place, and time.  Skin: Skin is warm and dry. No rash noted.  Psychiatric: He has a normal mood and affect. His behavior is normal.   Filed Vitals:   06/21/14 0959  BP: 158/64  Pulse: 62  Temp: 97.7 F (36.5 C)  TempSrc: Oral  Resp: 16  Height: 5\' 8"  (1.727 m)  Weight: 230 lb (104.327 kg)  SpO2: 98%       Assessment & Plan:   Wesley Mccarthy is a 63 y.o. male Acute upper respiratory infections of unspecified site  -sx care with fluids, rest, mucinex, saline ns.  advised against decongestants.   -rtc precautions.   Essential hypertension  -elevated today - suspect this is due to otc cold medicine with phenylephrine. D/c this as above, and check BP off cold medicine. RTC precautions.   Meds ordered this encounter  Medications  . loratadine (CLARITIN) 10 MG tablet    Sig: Take 10 mg by mouth daily as needed.   Patient Instructions  Saline nasal spray atleast 4 times per day for congestion, over the counter mucinex or mucinex DM for cough, but avoid other decongestants and over the counter cold medicines as these are likely affecting your blood pressure. Drink plenty of fluids.  Check your blood pressure in next few days to make sure levels improve off the other cold medicine. Return to the clinic or go to the nearest emergency room if any of your symptoms worsen or new symptoms occur.  Upper Respiratory Infection, Adult An upper respiratory infection (URI) is also sometimes known as the common cold. The upper respiratory tract includes the nose, sinuses, throat, trachea, and bronchi. Bronchi are the airways leading to the lungs. Most people improve within 1 week, but symptoms can last up to 2 weeks. A residual  cough may last even longer.  CAUSES Many different viruses can infect the tissues lining the upper respiratory tract. The tissues become irritated and inflamed and often become very moist. Mucus production is also common. A cold is contagious. You can easily spread the virus to others by oral contact. This includes kissing, sharing a glass, coughing, or sneezing. Touching your mouth or nose and then touching a surface, which is then touched by another person, can also spread the virus. SYMPTOMS  Symptoms typically develop 1 to 3 days after you come in contact with a cold virus. Symptoms vary from person to person. They may include:  Runny nose.  Sneezing.  Nasal congestion.  Sinus irritation.  Sore throat.  Loss of voice (laryngitis).  Cough.  Fatigue.  Muscle aches.  Loss of appetite.  Headache.  Low-grade fever. DIAGNOSIS  You might diagnose your own cold based on familiar symptoms, since most people get a cold 2 to 3 times a year. Your caregiver can confirm this based on your exam. Most importantly, your caregiver can check that your symptoms are not due to another disease such as strep throat, sinusitis, pneumonia, asthma, or epiglottitis. Blood tests, throat tests, and X-rays are not necessary to diagnose a common cold, but they may sometimes be helpful in excluding other more serious diseases. Your caregiver will decide if any further tests are required. RISKS AND COMPLICATIONS  You may be at risk for a more severe case of the common cold if you smoke cigarettes, have chronic heart disease (such as heart failure) or lung disease (such as asthma), or if you have a weakened immune system. The very young and very old are also at risk for more serious infections. Bacterial sinusitis, middle ear infections, and bacterial pneumonia can complicate the common cold. The common cold can worsen asthma and chronic obstructive pulmonary disease (COPD). Sometimes, these complications can  require emergency medical care and may be life-threatening. PREVENTION  The best way to protect against getting a cold is to practice good hygiene. Avoid oral or hand contact with people with cold symptoms. Wash your hands often if contact occurs. There is no clear evidence that vitamin C, vitamin E, echinacea, or exercise reduces the chance of developing a cold. However, it is always recommended to get plenty of rest and practice good nutrition. TREATMENT  Treatment is directed at relieving symptoms. There is no cure. Antibiotics are not effective, because the infection is caused by a virus, not by bacteria. Treatment may include:  Increased fluid intake. Sports drinks offer valuable electrolytes, sugars, and fluids.  Breathing heated mist or steam (vaporizer or shower).  Eating chicken soup or other clear broths, and maintaining good nutrition.  Getting plenty of rest.  Using gargles or lozenges for comfort.  Controlling fevers with ibuprofen or acetaminophen as directed by your caregiver.  Increasing usage of your inhaler if you have asthma. Zinc gel and zinc lozenges, taken in the first 24 hours of the common cold, can shorten the duration and lessen the severity of symptoms. Pain medicines may help with fever, muscle aches, and throat pain. A variety of non-prescription medicines are available to treat congestion and runny nose. Your caregiver can make recommendations and may suggest nasal or lung inhalers for other symptoms.  HOME CARE INSTRUCTIONS   Only take over-the-counter or prescription medicines for pain, discomfort, or fever as directed by your caregiver.  Use a warm mist humidifier or inhale steam from a shower to increase air moisture. This may keep secretions moist and make it easier to breathe.  Drink enough water and fluids to keep your urine clear or pale yellow.  Rest as needed.  Return to work when your temperature has returned to normal or as your caregiver  advises. You may need to stay home longer to avoid infecting others. You can also use a face mask and careful hand washing to prevent spread of the virus. SEEK MEDICAL CARE IF:   After the first few days, you feel you are getting worse rather than better.  You need your caregiver's advice about medicines to control symptoms.  You develop chills, worsening shortness of breath, or brown or red sputum. These may be signs of pneumonia.  You develop yellow or brown nasal discharge  or pain in the face, especially when you bend forward. These may be signs of sinusitis.  You develop a fever, swollen neck glands, pain with swallowing, or white areas in the back of your throat. These may be signs of strep throat. SEEK IMMEDIATE MEDICAL CARE IF:   You have a fever.  You develop severe or persistent headache, ear pain, sinus pain, or chest pain.  You develop wheezing, a prolonged cough, cough up blood, or have a change in your usual mucus (if you have chronic lung disease).  You develop sore muscles or a stiff neck. Document Released: 03/17/2001 Document Revised: 12/14/2011 Document Reviewed: 12/27/2013 [facility] Patient Information 2015 [place], [place]. This information is not intended to replace advice given to you by your health care provider. Make sure you discuss any questions you have with your health care provider.

## 2014-07-16 ENCOUNTER — Other Ambulatory Visit: Payer: Self-pay | Admitting: Family Medicine

## 2014-07-16 DIAGNOSIS — G40909 Epilepsy, unspecified, not intractable, without status epilepticus: Secondary | ICD-10-CM

## 2014-07-16 NOTE — Telephone Encounter (Signed)
Called and spoke with him- I will refill his phenobarb for a month, but he needs to come and have a level drawn.  He will do so, future order placed.

## 2014-07-24 ENCOUNTER — Other Ambulatory Visit (INDEPENDENT_AMBULATORY_CARE_PROVIDER_SITE_OTHER): Payer: Managed Care, Other (non HMO) | Admitting: Radiology

## 2014-07-24 DIAGNOSIS — Z23 Encounter for immunization: Secondary | ICD-10-CM

## 2014-07-24 DIAGNOSIS — G40909 Epilepsy, unspecified, not intractable, without status epilepticus: Secondary | ICD-10-CM

## 2014-07-24 DIAGNOSIS — D649 Anemia, unspecified: Secondary | ICD-10-CM

## 2014-07-24 DIAGNOSIS — I10 Essential (primary) hypertension: Secondary | ICD-10-CM

## 2014-07-24 LAB — POCT CBC
GRANULOCYTE PERCENT: 54.7 % (ref 37–80)
HCT, POC: 44.2 % (ref 43.5–53.7)
Hemoglobin: 13.7 g/dL — AB (ref 14.1–18.1)
Lymph, poc: 1.7 (ref 0.6–3.4)
MCH, POC: 29.9 pg (ref 27–31.2)
MCHC: 31.1 g/dL — AB (ref 31.8–35.4)
MCV: 96.2 fL (ref 80–97)
MID (cbc): 0.6 (ref 0–0.9)
MPV: 6.9 fL (ref 0–99.8)
POC GRANULOCYTE: 2.8 (ref 2–6.9)
POC LYMPH %: 33.6 % (ref 10–50)
POC MID %: 11.7 %M (ref 0–12)
Platelet Count, POC: 185 10*3/uL (ref 142–424)
RBC: 4.59 M/uL — AB (ref 4.69–6.13)
RDW, POC: 14.9 %
WBC: 5.1 10*3/uL (ref 4.6–10.2)

## 2014-07-24 NOTE — Progress Notes (Signed)
Pt here for labs only. 

## 2014-07-25 LAB — BASIC METABOLIC PANEL
BUN: 17 mg/dL (ref 6–23)
CHLORIDE: 103 meq/L (ref 96–112)
CO2: 28 mEq/L (ref 19–32)
Calcium: 9.3 mg/dL (ref 8.4–10.5)
Creat: 1.37 mg/dL — ABNORMAL HIGH (ref 0.50–1.35)
Glucose, Bld: 105 mg/dL — ABNORMAL HIGH (ref 70–99)
POTASSIUM: 4.3 meq/L (ref 3.5–5.3)
SODIUM: 140 meq/L (ref 135–145)

## 2014-07-25 LAB — PHENOBARBITAL LEVEL: PHENOBARBITAL: 16.2 mg/L (ref 15.0–40.0)

## 2014-07-26 ENCOUNTER — Encounter: Payer: Self-pay | Admitting: Family Medicine

## 2014-09-11 ENCOUNTER — Other Ambulatory Visit: Payer: Self-pay

## 2014-09-11 DIAGNOSIS — G40909 Epilepsy, unspecified, not intractable, without status epilepticus: Secondary | ICD-10-CM

## 2014-09-11 MED ORDER — PHENOBARBITAL 97.2 MG PO TABS: 97.2000 mg | ORAL_TABLET | Freq: Every day | ORAL | Status: DC

## 2014-09-11 NOTE — Telephone Encounter (Signed)
Pharm faxed req for RF of phenobarbitol. Pended for review. Dr Lorelei Pont, I see that you had pt come in for lab in Oct to check level, didn't know if/how many RFs you would want to give pt?

## 2014-09-17 ENCOUNTER — Other Ambulatory Visit: Payer: Self-pay | Admitting: Family Medicine

## 2014-09-18 ENCOUNTER — Other Ambulatory Visit: Payer: Self-pay | Admitting: Family Medicine

## 2014-09-20 ENCOUNTER — Other Ambulatory Visit: Payer: Self-pay | Admitting: Family Medicine

## 2014-10-14 ENCOUNTER — Other Ambulatory Visit: Payer: Self-pay | Admitting: Physician Assistant

## 2014-11-08 ENCOUNTER — Other Ambulatory Visit: Payer: Self-pay | Admitting: Family Medicine

## 2014-12-03 ENCOUNTER — Other Ambulatory Visit: Payer: Self-pay | Admitting: Family Medicine

## 2014-12-06 NOTE — Telephone Encounter (Signed)
Error

## 2014-12-07 ENCOUNTER — Other Ambulatory Visit: Payer: Self-pay | Admitting: Family Medicine

## 2014-12-13 ENCOUNTER — Other Ambulatory Visit: Payer: Self-pay | Admitting: Physician Assistant

## 2014-12-18 ENCOUNTER — Ambulatory Visit (INDEPENDENT_AMBULATORY_CARE_PROVIDER_SITE_OTHER): Payer: Managed Care, Other (non HMO) | Admitting: Physician Assistant

## 2014-12-18 VITALS — BP 150/98 | HR 60 | Temp 97.7°F | Resp 16 | Ht 68.25 in | Wt 233.0 lb

## 2014-12-18 DIAGNOSIS — I10 Essential (primary) hypertension: Secondary | ICD-10-CM | POA: Diagnosis not present

## 2014-12-18 DIAGNOSIS — R600 Localized edema: Secondary | ICD-10-CM

## 2014-12-18 DIAGNOSIS — Z23 Encounter for immunization: Secondary | ICD-10-CM | POA: Diagnosis not present

## 2014-12-18 LAB — COMPLETE METABOLIC PANEL WITH GFR
ALT: 20 U/L (ref 0–53)
AST: 24 U/L (ref 0–37)
Albumin: 3.9 g/dL (ref 3.5–5.2)
Alkaline Phosphatase: 135 U/L — ABNORMAL HIGH (ref 39–117)
BUN: 14 mg/dL (ref 6–23)
CALCIUM: 9.2 mg/dL (ref 8.4–10.5)
CO2: 24 mEq/L (ref 19–32)
Chloride: 103 mEq/L (ref 96–112)
Creat: 1.36 mg/dL — ABNORMAL HIGH (ref 0.50–1.35)
GFR, Est African American: 63 mL/min
GFR, Est Non African American: 55 mL/min — ABNORMAL LOW
Glucose, Bld: 84 mg/dL (ref 70–99)
POTASSIUM: 4.2 meq/L (ref 3.5–5.3)
Sodium: 139 mEq/L (ref 135–145)
Total Bilirubin: 0.4 mg/dL (ref 0.2–1.2)
Total Protein: 7.1 g/dL (ref 6.0–8.3)

## 2014-12-18 LAB — TSH: TSH: 2.023 u[IU]/mL (ref 0.350–4.500)

## 2014-12-18 MED ORDER — HYDROCHLOROTHIAZIDE 25 MG PO TABS: 25.0000 mg | ORAL_TABLET | Freq: Every day | ORAL | Status: DC

## 2014-12-18 MED ORDER — FUROSEMIDE 20 MG PO TABS: 20.0000 mg | ORAL_TABLET | Freq: Every day | ORAL | Status: DC

## 2014-12-18 MED ORDER — ZOSTER VACCINE LIVE 19400 UNT/0.65ML ~~LOC~~ SOLR: 0.6500 mL | Freq: Once | SUBCUTANEOUS | Status: DC

## 2014-12-18 NOTE — Patient Instructions (Signed)
I think the swelling in your legs is due to venous insufficiency as below.  Please continue to take the lasix and lisinopril as prescribed. I have increased the hctz dose from 12.5 mg to 25 mg daily. I filled this for 6 months and refilled your lasix for 6 months. I drew labs today to check your liver, kidneys, thyroid, electrolytes, and heart. I will let you know about these.  For the swelling, wearing compression stockings every day while working will help. Elevating your legs while at home will help. Limiting your sodium intake and being sure to get good water intake will help.  Please let us know if these measures don't help.  Please be sure to go to the ED right away or return here if you start having chest pain, shortness of breath at rest, or trouble breathing when you lie flat.  You should go to a pharmacy for the shingles vaccine.   Venous Stasis or Chronic Venous Insufficiency Chronic venous insufficiency, also called venous stasis, is a condition that affects the veins in the legs. The condition prevents blood from being pumped through these veins effectively. Blood may no longer be pumped effectively from the legs back to the heart. This condition can range from mild to severe. With proper treatment, you should be able to continue with an active life. CAUSES  Chronic venous insufficiency occurs when the vein walls become stretched, weakened, or damaged or when valves within the vein are damaged. Some common causes of this include:  High blood pressure inside the veins (venous hypertension).  Increased blood pressure in the leg veins from long periods of sitting or standing.  A blood clot that blocks blood flow in a vein (deep vein thrombosis).  Inflammation of a superficial vein (phlebitis) that causes a blood clot to form. RISK FACTORS Various things can make you more likely to develop chronic venous insufficiency, including:  Family history of this  condition.  Obesity.  Pregnancy.  Sedentary lifestyle.  Smoking.  Jobs requiring long periods of standing or sitting in one place.  Being a certain age. Women in their 44s and 63s and men in their 45s are more likely to develop this condition. SIGNS AND SYMPTOMS  Symptoms may include:   Varicose veins.  Skin breakdown or ulcers.  Reddened or discolored skin on the leg.  Brown, smooth, tight, and painful skin just above the ankle, usually on the inside surface (lipodermatosclerosis).  Swelling. DIAGNOSIS  To diagnose this condition, your health care provider will take a medical history and do a physical exam. The following tests may be ordered to confirm the diagnosis:  Duplex ultrasound--A procedure that produces a picture of a blood vessel and nearby organs and also provides information on blood flow through the blood vessel.  Plethysmography--A procedure that tests blood flow.  A venogram, or venography--A procedure used to look at the veins using X-ray and dye. TREATMENT The goals of treatment are to help you return to an active life and to minimize pain or disability. Treatment will depend on the severity of the condition. Medical procedures may be needed for severe cases. Treatment options may include:   Use of compression stockings. These can help with symptoms and lower the chances of the problem getting worse, but they do not cure the problem.  Sclerotherapy--A procedure involving an injection of a material that "dissolves" the damaged veins. Other veins in the network of blood vessels take over the function of the damaged veins.  Surgery to  remove the vein or cut off blood flow through the vein (vein stripping or laser ablation surgery).  Surgery to repair a valve. HOME CARE INSTRUCTIONS   Wear compression stockings as directed by your health care provider.  Only take over-the-counter or prescription medicines for pain, discomfort, or fever as directed by your  health care provider.  Follow up with your health care provider as directed. SEEK MEDICAL CARE IF:   You have redness, swelling, or increasing pain in the affected area.  You see a red streak or line that extends up or down from the affected area.  You have a breakdown or loss of skin in the affected area, even if the breakdown is small.  You have an injury to the affected area. SEEK IMMEDIATE MEDICAL CARE IF:   You have an injury and open wound in the affected area.  Your pain is severe and does not improve with medicine.  You have sudden numbness or weakness in the foot or ankle below the affected area, or you have trouble moving your foot or ankle.  You have a fever or persistent symptoms for more than 2-3 days.  You have a fever and your symptoms suddenly get worse. MAKE SURE YOU:   Understand these instructions.  Will watch your condition.  Will get help right away if you are not doing well or get worse. Document Released: 01/25/2007 Document Revised: 07/12/2013 Document Reviewed: 05/29/2013 [facility] Patient Information 2015 [place], [place]. This information is not intended to replace advice given to you by your health care provider. Make sure you discuss any questions you have with your health care provider.

## 2014-12-18 NOTE — Progress Notes (Signed)
Subjective:    Patient ID: Wesley Mccarthy, male    DOB: 11-23-1950, 64 y.o.   MRN: [id number]  Chief Complaint  Patient presents with  . Leg Swelling    3 months   Patient Active Problem List   Diagnosis Date Noted  . Anemia 08/22/2013  . 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 Stasis dermatitis of both legs 12/21/2012  . Edema 12/21/2012  . Seizure disorder 08/26/2012  . HTN (hypertension) 11/11/2011  . Hyperlipemia 11/11/2011  . Gout 11/11/2011  . Obesity 11/11/2011   Prior to Admission medications   Medication Sig Start Date End Date Taking? Authorizing Provider  allopurinol (ZYLOPRIM) 300 MG tablet Take 1 tablet (300 mg total) by mouth daily. PATIENT NEEDS OFFICE VISIT FOR ADDITIONAL REFILLS 11/08/14  Yes Elby Beck, FNP  furosemide (LASIX) 20 MG tablet Take 1 tablet (20 mg total) by mouth daily. 12/18/14  Yes Merlinda Frederick Brandis Wixted, PA  lisinopril (PRINIVIL,ZESTRIL) 40 MG tablet Take 1 tablet (40 mg total) by mouth daily. PATIENT NEEDS OFFICE VISIT FOR ADDITIONAL REFILLS 11/08/14  Yes Elby Beck, FNP  PHENobarbital (LUMINAL) 97.2 MG tablet Take 1 tablet (97.2 mg total) by mouth daily. 09/11/14  Yes Gay Filler Copland, MD  Potassium Chloride ER 20 MEQ TBCR TAKE 1 OR 2 TABLETS BY MOUTH DAILY TO TWICE DAILY AS DIRECTED.  NEED OFFICE VISIT FOR FURTHER REFILLS. 12/13/14  Yes Wendie Agreste, MD  pravastatin (PRAVACHOL) 40 MG tablet TAKE 1 TABLET (40 MG TOTAL) BY MOUTH DAILY.  "OV NEEDED FOR ADDITIONAL REFILLS" 12/09/14  Yes Chelle S Jeffery, PA-C  hydrochlorothiazide (HYDRODIURIL) 25 MG tablet Take 1 tablet (25 mg total) by mouth daily. 12/18/14   Julieta Gutting, PA  zoster vaccine live, PF, (ZOSTAVAX) 07121 UNT/0.65ML injection Inject 19,400 Units into the skin once. 12/18/14   Julieta Gutting, PA   Medications, allergies, past medical history, surgical history, family history, social history and problem list reviewed and updated.  HPI  89 yom with pmh htn, high cho, LE edema presents with  worsened bilateral LE edema over past few months.   Has been on bp medication for several decades. Currently taking lisinopril 40 qd, hctz 12.5 qd and lasix 20 qd. Has had intermittent issues with LE edema for many yrs. Feels it has bene worse than normal past few months. Bilateral. Denies assoc cp, sob, doe, orthopnea, pnd, palps, presyncope, syncope.   Does not check bp at home. BP mildly elevated today 150/98 and was similar range last visit 6 months ago. Denies ha, vision changes. Has been seen for LE edema before and told to wear compression stockings and elevate legs. He does not do either of these. Doesn't watch his sodium intake. Doesn't exercise. 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    Feels that his swelling is slightly better when he wakes in the am. Was on amlodipine in the past. 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   Would like shingles, pneumonia vaccines.   Review of Systems See HPI.     Objective:   Physical Exam  Constitutional: He is oriented to person, place, and time. He appears well-developed and well-nourished.  Non-toxic appearance. He does not have a sickly appearance. He does not appear ill. No distress.  BP 150/98 mmHg  Pulse 60  Temp(Src) 97.7 F (36.5 C) (Oral)  Resp 16  Ht 5' 8.25" (1.734 m)  Wt 233 lb (105.688 kg)  BMI 35.15 kg/m2  SpO2 97%   Eyes: Conjunctivae and EOM are normal. Pupils are equal, round, and reactive to light.  Neck: No hepatojugular reflux and no JVD present. Carotid bruit is not present.  Cardiovascular: Normal rate, regular rhythm, S1 normal and S2 normal.  Exam reveals no gallop, no S3, no S4 and no decreased pulses.   No murmur heard. Pulmonary/Chest: Effort normal and breath sounds normal. He has no decreased breath sounds. He has no wheezes. He has no rhonchi. He has no rales.  Abdominal: He exhibits no distension, no fluid wave and no ascites.  Neurological: He is  alert and oriented to person, place, and time.  Skin: Skin is warm and dry. He is not diaphoretic.  Psychiatric: He has a normal mood and affect. His speech is normal and behavior is normal.      Assessment & Plan:   74 yom with pmh htn, high cho, LE edema presents with worsened bilateral LE edema over past few months.   Essential hypertension, benign - Plan: hydrochlorothiazide (HYDRODIURIL) 25 MG tablet, COMPLETE METABOLIC PANEL WITH GFR --mildly elevated today and at last visit, could be contributing to edema --increase hctz from 12.5 to 25 qd --k, bun/cr today  Bilateral lower extremity edema - Plan: furosemide (LASIX) 20 MG tablet, Brain natriuretic peptide, COMPLETE METABOLIC PANEL WITH GFR, TSH --doubt chf cause with no rales, no jvd, no s4, no chf sx, checking bnp today --checking cmp, tsh for liver, kidneys, thyroid as possible cause --suspect from venous insufficiency as is better in am, he is on feet all day, and has been an ongoing issue for yrs --compression stockings daily during work, elevate legs while home in evenings, limit sodium, hydrate well --lasix still at 20 qd, could increase to 40 in future if needed pending k --ER precautions for cp, sob  Need for shingles vaccine - Plan: zoster vaccine live, PF, (ZOSTAVAX) 53976 UNT/0.65ML injection --script given --pneumonia vaccine at [place], PA-C Physician Assistant-Certified [facility]  12/19/2014 10:30 AM

## 2014-12-19 LAB — BRAIN NATRIURETIC PEPTIDE: Brain Natriuretic Peptide: 42.2 pg/mL (ref 0.0–100.0)

## 2015-01-14 ENCOUNTER — Other Ambulatory Visit: Payer: Self-pay | Admitting: Physician Assistant

## 2015-01-14 DIAGNOSIS — G40909 Epilepsy, unspecified, not intractable, without status epilepticus: Secondary | ICD-10-CM

## 2015-01-14 DIAGNOSIS — E785 Hyperlipidemia, unspecified: Secondary | ICD-10-CM

## 2015-01-14 NOTE — Telephone Encounter (Signed)
Pharm also req'd refill of phenobarbital. Wesley Mccarthy, you just saw pt for check up, but don't see seizures addressed. Is it OK to give RFs of this and chol med?

## 2015-01-15 NOTE — Telephone Encounter (Signed)
Does the pt have a neurologist? I didn't review his seizure history at all with him. Where does he typically get his phenobarbital from?

## 2015-01-16 MED ORDER — PHENOBARBITAL 97.2 MG PO TABS: 97.2000 mg | ORAL_TABLET | Freq: Every day | ORAL | Status: DC

## 2015-01-16 NOTE — Telephone Encounter (Signed)
Dr Lorelei Pont, you have last seen pt for seizures and approved RFs back in Dec, but don't know if you want to give one month and have pt come back for check up, or give more? Pt last had Lipid test in July. Does he also need to RTC for this?

## 2015-01-21 ENCOUNTER — Encounter: Payer: Self-pay | Admitting: Family Medicine

## 2015-01-24 ENCOUNTER — Other Ambulatory Visit: Payer: Self-pay | Admitting: Family Medicine

## 2015-03-15 ENCOUNTER — Other Ambulatory Visit: Payer: Self-pay | Admitting: Physician Assistant

## 2015-03-15 ENCOUNTER — Other Ambulatory Visit: Payer: Self-pay | Admitting: Family Medicine

## 2015-04-17 ENCOUNTER — Telehealth: Payer: Self-pay | Admitting: Radiology

## 2015-04-17 ENCOUNTER — Other Ambulatory Visit: Payer: Self-pay | Admitting: Family Medicine

## 2015-04-17 NOTE — Telephone Encounter (Signed)
Dr Lorelei Pont, patients Rx was at  The desk, I called it in for patient/ but saw your note on the Rx not to refill/ so I did not call the one refill which was on the Rx, if you have questions let me know. Phoua Hoadley

## 2015-05-31 ENCOUNTER — Other Ambulatory Visit: Payer: Self-pay | Admitting: Physician Assistant

## 2015-06-15 ENCOUNTER — Other Ambulatory Visit: Payer: Self-pay | Admitting: Physician Assistant

## 2015-06-19 ENCOUNTER — Telehealth: Payer: Self-pay

## 2015-06-19 NOTE — Telephone Encounter (Signed)
Pt called back and I let him know that he needs to come in to have blood work done before his RFs are completed.  Pt states he will die before he gets the chance to come in.  He states he works two jobs and he can not come in this week.

## 2015-06-19 NOTE — Telephone Encounter (Signed)
Last RF in July had comment advising pt to come in to test blood level before more RFs. LMOM for pt to CB w/f/up plan or to come in for re-check.

## 2015-06-20 ENCOUNTER — Ambulatory Visit (INDEPENDENT_AMBULATORY_CARE_PROVIDER_SITE_OTHER): Payer: Managed Care, Other (non HMO) | Admitting: Family Medicine

## 2015-06-20 VITALS — BP 136/82 | HR 83 | Temp 98.0°F | Resp 18 | Ht 69.0 in | Wt 219.0 lb

## 2015-06-20 DIAGNOSIS — E785 Hyperlipidemia, unspecified: Secondary | ICD-10-CM

## 2015-06-20 DIAGNOSIS — G40909 Epilepsy, unspecified, not intractable, without status epilepticus: Secondary | ICD-10-CM | POA: Diagnosis not present

## 2015-06-20 DIAGNOSIS — I10 Essential (primary) hypertension: Secondary | ICD-10-CM

## 2015-06-20 DIAGNOSIS — Z23 Encounter for immunization: Secondary | ICD-10-CM | POA: Diagnosis not present

## 2015-06-20 DIAGNOSIS — M1A9XX0 Chronic gout, unspecified, without tophus (tophi): Secondary | ICD-10-CM | POA: Diagnosis not present

## 2015-06-20 LAB — COMPREHENSIVE METABOLIC PANEL
ALBUMIN: 4.2 g/dL (ref 3.6–5.1)
ALK PHOS: 123 U/L — AB (ref 40–115)
ALT: 26 U/L (ref 9–46)
AST: 34 U/L (ref 10–35)
BILIRUBIN TOTAL: 0.5 mg/dL (ref 0.2–1.2)
BUN: 22 mg/dL (ref 7–25)
CALCIUM: 9.3 mg/dL (ref 8.6–10.3)
CO2: 29 mmol/L (ref 20–31)
Chloride: 98 mmol/L (ref 98–110)
Creat: 1.54 mg/dL — ABNORMAL HIGH (ref 0.70–1.25)
GLUCOSE: 105 mg/dL — AB (ref 65–99)
Potassium: 3 mmol/L — ABNORMAL LOW (ref 3.5–5.3)
Sodium: 141 mmol/L (ref 135–146)
TOTAL PROTEIN: 7.4 g/dL (ref 6.1–8.1)

## 2015-06-20 LAB — LIPID PANEL
CHOL/HDL RATIO: 5.1 ratio — AB (ref <=5.0)
CHOLESTEROL: 154 mg/dL (ref 125–200)
HDL: 30 mg/dL — AB (ref >=40)
LDL CALC: 87 mg/dL (ref <130)
TRIGLYCERIDES: 183 mg/dL — AB (ref <150)
VLDL: 37 mg/dL — AB (ref <30)

## 2015-06-20 MED ORDER — HYDROCHLOROTHIAZIDE 25 MG PO TABS: ORAL_TABLET | ORAL | Status: DC

## 2015-06-20 MED ORDER — ALLOPURINOL 300 MG PO TABS: ORAL_TABLET | ORAL | Status: DC

## 2015-06-20 MED ORDER — PHENOBARBITAL 97.2 MG PO TABS: 97.2000 mg | ORAL_TABLET | Freq: Every day | ORAL | Status: DC

## 2015-06-20 MED ORDER — LISINOPRIL 40 MG PO TABS: 40.0000 mg | ORAL_TABLET | Freq: Every day | ORAL | Status: DC

## 2015-06-20 MED ORDER — PRAVASTATIN SODIUM 40 MG PO TABS: 40.0000 mg | ORAL_TABLET | Freq: Every day | ORAL | Status: DC

## 2015-06-20 MED ORDER — POTASSIUM CHLORIDE ER 20 MEQ PO TBCR: EXTENDED_RELEASE_TABLET | ORAL | Status: DC

## 2015-06-20 NOTE — Progress Notes (Signed)
Refill of medication Subjective:  Patient ID: Wesley Mccarthy, male    DOB: 1951/06/25  Age: 64 y.o. MRN: [id number]   patient is here for refill of his medications. He works at [employer]. No major complaints. Works and goes home. He has a long history of seizure disorder, has been on phenobarbital since teenager, and wants to continue on it. He takes only one daily. He is controlled and doesn't want to change. He takes his medicine for blood pressure and cholesterol. He also is on allopurinol for gout.   Objective:    no acute distress. Throat clear. Neck supple without nodes or thyromegaly. Chest clear. Heart regular without murmurs. And soft without mass or tenderness. No ankle edema.  Assessment & Plan:   Assessment:   hypertension Hyperlipidemia Seizure disorder History of gout  desire for flu shot  Plan:   I talked to him about his medications. I don't think he needs both  Furosemide and hydrochlorothiazide if he is not swollen. Advise stopping the furosemide. Decrease potassium to just one daily. Continue other medications. Rewrote his prescriptions. See him back in about 6 months.  He's not sure when he had his last colonoscopy. I think he should probably be sent for another one , but will wait until after next visit to do so. He is to check and see if he can find out when he had his last one.  Patient Instructions  Influenza Virus Vaccine injection (Fluarix) What is this medicine? INFLUENZA VIRUS VACCINE (in floo EN zuh VAHY ruhs vak SEEN) helps to reduce the risk of getting influenza also known as the flu. This medicine may be used for other purposes; ask your health care provider or pharmacist if you have questions. COMMON BRAND NAME(S): Fluarix, Fluzone What should I tell my health care provider before I take this medicine? They need to know if you have any of these conditions: -bleeding disorder like hemophilia -fever or infection -Guillain-Barre syndrome or other  neurological problems -immune system problems -infection with the human immunodeficiency virus (HIV) or AIDS -low blood platelet counts -multiple sclerosis -an unusual or allergic reaction to influenza virus vaccine, eggs, chicken proteins, latex, gentamicin, other medicines, foods, dyes or preservatives -pregnant or trying to get pregnant -breast-feeding How should I use this medicine? This vaccine is for injection into a muscle. It is given by a health care professional. A copy of Vaccine Information Statements will be given before each vaccination. Read this sheet carefully each time. The sheet may change frequently. Talk to your pediatrician regarding the use of this medicine in children. Special care may be needed. Overdosage: If you think you have taken too much of this medicine contact a poison control center or emergency room at once. NOTE: This medicine is only for you. Do not share this medicine with others. What if I miss a dose? This does not apply. What may interact with this medicine? -chemotherapy or radiation therapy -medicines that lower your immune system like etanercept, anakinra, infliximab, and adalimumab -medicines that treat or prevent blood clots like warfarin -phenytoin -steroid medicines like prednisone or cortisone -theophylline -vaccines This list may not describe all possible interactions. Give your health care provider a list of all the medicines, herbs, non-prescription drugs, or dietary supplements you use. Also tell them if you smoke, drink alcohol, or use illegal drugs. Some items may interact with your medicine. What should I watch for while using this medicine? Report any side effects that do not go away within 3  days to your doctor or health care professional. Call your health care provider if any unusual symptoms occur within 6 weeks of receiving this vaccine. You may still catch the flu, but the illness is not usually as bad. You cannot get the flu  from the vaccine. The vaccine will not protect against colds or other illnesses that may cause fever. The vaccine is needed every year. What side effects may I notice from receiving this medicine? Side effects that you should report to your doctor or health care professional as soon as possible: -allergic reactions like skin rash, itching or hives, swelling of the face, lips, or tongue Side effects that usually do not require medical attention (report to your doctor or health care professional if they continue or are bothersome): -fever -headache -muscle aches and pains -pain, tenderness, redness, or swelling at site where injected -weak or tired This list may not describe all possible side effects. Call your doctor for medical advice about side effects. You may report side effects to FDA at 1-800-FDA-1088. Where should I keep my medicine? This vaccine is only given in a clinic, pharmacy, doctor's office, or other health care setting and will not be stored at home. NOTE: This sheet is a summary. It may not cover all possible information. If you have questions about this medicine, talk to your doctor, pharmacist, or health care provider.  2015, Elsevier/Gold Standard. (2008-04-18 09:30:40)    Continue taking the phenobarbital one daily    decrease the potassium to just one daily  Discontinue the furosemide (Lasix)    continue taking the hydrochlorothiazide  For blood pressure and fluid  Continue taking the lisinopril for blood pressure   Continue taking pravastatin for cholesterol   Continue take the allopurinol for chronic gout    after next visit we probably should go ahead and schedule you for a another screening colonoscopy. Check and see if anywhere your records you can tell when you had your last one.    return in about early March.     HOPPER,DAVID, MD 06/20/2015

## 2015-06-20 NOTE — Patient Instructions (Addendum)
Influenza Virus Vaccine injection (Fluarix) What is this medicine? INFLUENZA VIRUS VACCINE (in floo EN zuh VAHY ruhs vak SEEN) helps to reduce the risk of getting influenza also known as the flu. This medicine may be used for other purposes; ask your health care provider or pharmacist if you have questions. COMMON BRAND NAME(S): Fluarix, Fluzone What should I tell my health care provider before I take this medicine? They need to know if you have any of these conditions: -bleeding disorder like hemophilia -fever or infection -Guillain-Barre syndrome or other neurological problems -immune system problems -infection with the human immunodeficiency virus (HIV) or AIDS -low blood platelet counts -multiple sclerosis -an unusual or allergic reaction to influenza virus vaccine, eggs, chicken proteins, latex, gentamicin, other medicines, foods, dyes or preservatives -pregnant or trying to get pregnant -breast-feeding How should I use this medicine? This vaccine is for injection into a muscle. It is given by a health care professional. A copy of Vaccine Information Statements will be given before each vaccination. Read this sheet carefully each time. The sheet may change frequently. Talk to your pediatrician regarding the use of this medicine in children. Special care may be needed. Overdosage: If you think you have taken too much of this medicine contact a poison control center or emergency room at once. NOTE: This medicine is only for you. Do not share this medicine with others. What if I miss a dose? This does not apply. What may interact with this medicine? -chemotherapy or radiation therapy -medicines that lower your immune system like etanercept, anakinra, infliximab, and adalimumab -medicines that treat or prevent blood clots like warfarin -phenytoin -steroid medicines like prednisone or cortisone -theophylline -vaccines This list may not describe all possible interactions. Give your  health care provider a list of all the medicines, herbs, non-prescription drugs, or dietary supplements you use. Also tell them if you smoke, drink alcohol, or use illegal drugs. Some items may interact with your medicine. What should I watch for while using this medicine? Report any side effects that do not go away within 3 days to your doctor or health care professional. Call your health care provider if any unusual symptoms occur within 6 weeks of receiving this vaccine. You may still catch the flu, but the illness is not usually as bad. You cannot get the flu from the vaccine. The vaccine will not protect against colds or other illnesses that may cause fever. The vaccine is needed every year. What side effects may I notice from receiving this medicine? Side effects that you should report to your doctor or health care professional as soon as possible: -allergic reactions like skin rash, itching or hives, swelling of the face, lips, or tongue Side effects that usually do not require medical attention (report to your doctor or health care professional if they continue or are bothersome): -fever -headache -muscle aches and pains -pain, tenderness, redness, or swelling at site where injected -weak or tired This list may not describe all possible side effects. Call your doctor for medical advice about side effects. You may report side effects to FDA at 1-800-FDA-1088. Where should I keep my medicine? This vaccine is only given in a clinic, pharmacy, doctor's office, or other health care setting and will not be stored at home. NOTE: This sheet is a summary. It may not cover all possible information. If you have questions about this medicine, talk to your doctor, pharmacist, or health care provider.  2015, Elsevier/Gold Standard. (2008-04-18 09:30:40)    Continue taking  the phenobarbital one daily    decrease the potassium to just one daily  Discontinue the furosemide (Lasix)    continue  taking the hydrochlorothiazide  For blood pressure and fluid  Continue taking the lisinopril for blood pressure   Continue taking pravastatin for cholesterol   Continue take the allopurinol for chronic gout    after next visit we probably should go ahead and schedule you for a another screening colonoscopy. Check and see if anywhere your records you can tell when you had your last one.    return in about early March.

## 2015-06-25 ENCOUNTER — Other Ambulatory Visit: Payer: Self-pay | Admitting: Family Medicine

## 2015-06-25 DIAGNOSIS — E876 Hypokalemia: Secondary | ICD-10-CM

## 2015-10-03 ENCOUNTER — Ambulatory Visit (INDEPENDENT_AMBULATORY_CARE_PROVIDER_SITE_OTHER): Payer: Managed Care, Other (non HMO) | Admitting: Family Medicine

## 2015-10-03 VITALS — BP 142/80 | HR 88 | Temp 99.2°F | Resp 16 | Ht 68.0 in | Wt 226.4 lb

## 2015-10-03 DIAGNOSIS — R05 Cough: Secondary | ICD-10-CM | POA: Diagnosis not present

## 2015-10-03 DIAGNOSIS — G40909 Epilepsy, unspecified, not intractable, without status epilepticus: Secondary | ICD-10-CM | POA: Diagnosis not present

## 2015-10-03 DIAGNOSIS — R059 Cough, unspecified: Secondary | ICD-10-CM

## 2015-10-03 MED ORDER — BENZONATATE 100 MG PO CAPS: 100.0000 mg | ORAL_CAPSULE | Freq: Three times a day (TID) | ORAL | Status: DC | PRN

## 2015-10-03 NOTE — Progress Notes (Signed)
[facility] [address], [place] [address] [phone]  Date:  10/03/2015   Name:  Wesley Mccarthy   DOB:  11-01-1950   MRN:  [id number]  PCP:  Ruben Reason, MD    Chief Complaint: Cough and Nasal Congestion   History of Present Illness:  Wesley Mccarthy is a 64 y.o. very pleasant male patient who presents with the following:  Here today with illness- he has been ill for about 2 days.  He has noted sneezing, cough, "stopped up nose."  He has noted some chills.  He has not noted a fever.  No body aches No vomiting.   He tried some medication OTC- a cough syrup and a decongestant  He has a new grandbaby.    He has not had a seizure since his teens or twenties- he does not see a neurologist currently   Patient Active Problem List   Diagnosis Date Noted  . Anemia 08/22/2013  . 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 Stasis dermatitis of both legs 12/21/2012  . Edema 12/21/2012  . Seizure disorder ([place]) 08/26/2012  . HTN (hypertension) 11/11/2011  . Hyperlipemia 11/11/2011  . Gout 11/11/2011  . Obesity 11/11/2011    Past Medical History  Diagnosis Date  . Seizures ([place])   . Arthritis   . Allergy   . Hypertension   . Hyperlipidemia     Past Surgical History  Procedure Laterality Date  . Appendectomy    . Tonsillectomy      Social History  Substance Use Topics  . Smoking status: Never Smoker   . Smokeless tobacco: None  . Alcohol Use: No    Family History  Problem Relation Age of Onset  . Heart disease Mother     Had a CABG  . Cancer Father     colon cancer    Allergies  Allergen Reactions  . 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  Marland Kitchen Penicillins     Medication list has been reviewed and updated.  Current Outpatient Prescriptions on File Prior to Visit  Medication Sig Dispense Refill  . allopurinol (ZYLOPRIM) 300 MG tablet TAKE 1 TABLET (300 MG TOTAL) BY MOUTH DAILY. 90 tablet 3  . hydrochlorothiazide  (HYDRODIURIL) 25 MG tablet TAKE 1 TABLET (25 MG TOTAL) BY MOUTH DAILY. 90 tablet 3  . lisinopril (PRINIVIL,ZESTRIL) 40 MG tablet Take 1 tablet (40 mg total) by mouth daily. 90 tablet 3  . PHENobarbital (LUMINAL) 97.2 MG tablet Take 1 tablet (97.2 mg total) by mouth daily. Please see DR. Ireland Chagnon for a level prior to further refills 90 tablet 1  . Potassium Chloride ER 20 MEQ TBCR Take 1 tablets by mouth daily to twice daily as directed. 90 tablet 3  . pravastatin (PRAVACHOL) 40 MG tablet Take 1 tablet (40 mg total) by mouth daily. 90 tablet 3  . zoster vaccine live, PF, (ZOSTAVAX) 09811 UNT/0.65ML injection Inject 19,400 Units into the skin once. (Patient not taking: Reported on 10/03/2015) 1 each 0   No current facility-administered medications on file prior to visit.    Review of Systems:  As per HPI- otherwise negative.   Physical Examination: Filed Vitals:   10/03/15 0902  BP: 142/80  Pulse: 88  Temp: 99.2 F (37.3 C)  Resp: 16   Filed Vitals:   10/03/15 0902  Height: 5\' 8"  (1.727 m)  Weight: 226 lb 6.4 oz (102.694 kg)   Body mass index is 34.43 kg/(m^2). Ideal  Body Weight: Weight in (lb) to have BMI = 25: 164.1  GEN: WDWN, NAD, Non-toxic, A & O x 3, overweight, looks well HEENT: Atraumatic, Normocephalic. Neck supple. No masses, No LAD.  Bilateral TM wnl, oropharynx normal.  PEERL,EOMI.   Ears and Nose: No external deformity. CV: RRR, No M/G/R. No JVD. No thrill. No extra heart sounds. PULM: CTA B, no wheezes, crackles, rhonchi. No retractions. No resp. distress. No accessory muscle use. EXTR: No c/c/e NEURO Normal gait.  PSYCH: Normally interactive. Conversant. Not depressed or anxious appearing.  Calm demeanor.    Assessment and Plan:  Cough - Plan: benzonatate (TESSALON) 100 MG capsule  Seizure disorder ([place]) - Plan: Ambulatory referral to Neurology  Counseled that he likely has a viral URI- at this time would not recommend abx.  Tessalon perles as needed for  cough We wonder if he still needs to be on seizure medications- will refer to neurology for a consultation Signed Lamar Blinks, MD

## 2015-10-03 NOTE — Patient Instructions (Addendum)
It appears that you have a viral illness- a "cold."  There is no particular treatment for this type of illness.  Rest, fluids, ibuprofen, afrin nasal spray will help with your symptoms.    Please give me a call if you are not feeling better in the next few days- Sooner if worse.   I will also refer you to a neurologist to see if you may be able to stop your seizure medications

## 2015-10-05 ENCOUNTER — Ambulatory Visit (INDEPENDENT_AMBULATORY_CARE_PROVIDER_SITE_OTHER): Payer: Managed Care, Other (non HMO) | Admitting: Family Medicine

## 2015-10-05 VITALS — BP 100/70 | HR 100 | Temp 99.3°F | Resp 20 | Ht 69.0 in | Wt 220.0 lb

## 2015-10-05 DIAGNOSIS — Z8701 Personal history of pneumonia (recurrent): Secondary | ICD-10-CM | POA: Diagnosis not present

## 2015-10-05 DIAGNOSIS — J069 Acute upper respiratory infection, unspecified: Secondary | ICD-10-CM

## 2015-10-05 MED ORDER — AZITHROMYCIN 250 MG PO TABS: ORAL_TABLET | ORAL | Status: DC

## 2015-10-05 NOTE — Patient Instructions (Signed)
Saline nasal spray atleast 4 times per day, over the counter mucinex or mucinex DM or tessalon if needed for cough.  Drink plenty of fluids.  If not improving into next week - can start antibiotic.  Return to the clinic or go to the nearest emergency room if any of your symptoms worsen or new symptoms occur.  Upper Respiratory Infection, Adult Most upper respiratory infections (URIs) are a viral infection of the air passages leading to the lungs. A URI affects the nose, throat, and upper air passages. The most common type of URI is nasopharyngitis and is typically referred to as "the common cold." URIs run their course and usually go away on their own. Most of the time, a URI does not require medical attention, but sometimes a bacterial infection in the upper airways can follow a viral infection. This is called a secondary infection. Sinus and middle ear infections are common types of secondary upper respiratory infections. Bacterial pneumonia can also complicate a URI. A URI can worsen asthma and chronic obstructive pulmonary disease (COPD). Sometimes, these complications can require emergency medical care and may be life threatening.  CAUSES Almost all URIs are caused by viruses. A virus is a type of germ and can spread from one person to another.  RISKS FACTORS You may be at risk for a URI if:   You smoke.   You have chronic heart or lung disease.  You have a weakened defense (immune) system.   You are very young or very old.   You have nasal allergies or asthma.  You work in crowded or poorly ventilated areas.  You work in health care facilities or schools. SIGNS AND SYMPTOMS  Symptoms typically develop 2-3 days after you come in contact with a cold virus. Most viral URIs last 7-10 days. However, viral URIs from the influenza virus (flu virus) can last 14-18 days and are typically more severe. Symptoms may include:   Runny or stuffy (congested) nose.   Sneezing.   Cough.    Sore throat.   Headache.   Fatigue.   Fever.   Loss of appetite.   Pain in your forehead, behind your eyes, and over your cheekbones (sinus pain).  Muscle aches.  DIAGNOSIS  Your health care provider may diagnose a URI by:  Physical exam.  Tests to check that your symptoms are not due to another condition such as:  Strep throat.  Sinusitis.  Pneumonia.  Asthma. TREATMENT  A URI goes away on its own with time. It cannot be cured with medicines, but medicines may be prescribed or recommended to relieve symptoms. Medicines may help:  Reduce your fever.  Reduce your cough.  Relieve nasal congestion. HOME CARE INSTRUCTIONS   Take medicines only as directed by your health care provider.   Gargle warm saltwater or take cough drops to comfort your throat as directed by your health care provider.  Use a warm mist humidifier or inhale steam from a shower to increase air moisture. This may make it easier to breathe.  Drink enough fluid to keep your urine clear or pale yellow.   Eat soups and other clear broths and maintain good nutrition.   Rest as needed.   Return to work when your temperature has returned to normal or as your health care provider advises. You may need to stay home longer to avoid infecting others. You can also use a face mask and careful hand washing to prevent spread of the virus.  Increase the usage  of your inhaler if you have asthma.   Do not use any tobacco products, including cigarettes, chewing tobacco, or electronic cigarettes. If you need help quitting, ask your health care provider. PREVENTION  The best way to protect yourself from getting a cold is to practice good hygiene.   Avoid oral or hand contact with people with cold symptoms.   Wash your hands often if contact occurs.  There is no clear evidence that vitamin C, vitamin E, echinacea, or exercise reduces the chance of developing a cold. However, it is always  recommended to get plenty of rest, exercise, and practice good nutrition.  SEEK MEDICAL CARE IF:   You are getting worse rather than better.   Your symptoms are not controlled by medicine.   You have chills.  You have worsening shortness of breath.  You have brown or red mucus.  You have yellow or brown nasal discharge.  You have pain in your face, especially when you bend forward.  You have a fever.  You have swollen neck glands.  You have pain while swallowing.  You have white areas in the back of your throat. SEEK IMMEDIATE MEDICAL CARE IF:   You have severe or persistent:  Headache.  Ear pain.  Sinus pain.  Chest pain.  You have chronic lung disease and any of the following:  Wheezing.  Prolonged cough.  Coughing up blood.  A change in your usual mucus.  You have a stiff neck.  You have changes in your:  Vision.  Hearing.  Thinking.  Mood. MAKE SURE YOU:   Understand these instructions.  Will watch your condition.  Will get help right away if you are not doing well or get worse.   This information is not intended to replace advice given to you by your health care provider. Make sure you discuss any questions you have with your health care provider.   Document Released: 03/17/2001 Document Revised: 02/05/2015 Document Reviewed: 12/27/2013 Elsevier Interactive Patient Education 2016 Elsevier Inc. Cough, Adult Coughing is a reflex that clears your throat and your airways. Coughing helps to heal and protect your lungs. It is normal to cough occasionally, but a cough that happens with other symptoms or lasts a long time may be a sign of a condition that needs treatment. A cough may last only 2-3 weeks (acute), or it may last longer than 8 weeks (chronic). CAUSES Coughing is commonly caused by:  Breathing in substances that irritate your lungs.  A viral or bacterial respiratory infection.  Allergies.  Asthma.  Postnasal  drip.  Smoking.  Acid backing up from the stomach into the esophagus (gastroesophageal reflux).  Certain medicines.  Chronic lung problems, including COPD (or rarely, lung cancer).  Other medical conditions such as heart failure. HOME CARE INSTRUCTIONS  Pay attention to any changes in your symptoms. Take these actions to help with your discomfort:  Take medicines only as told by your health care provider.  If you were prescribed an antibiotic medicine, take it as told by your health care provider. Do not stop taking the antibiotic even if you start to feel better.  Talk with your health care provider before you take a cough suppressant medicine.  Drink enough fluid to keep your urine clear or pale yellow.  If the air is dry, use a cold steam vaporizer or humidifier in your bedroom or your home to help loosen secretions.  Avoid anything that causes you to cough at work or at home.  If  your cough is worse at night, try sleeping in a semi-upright position.  Avoid cigarette smoke. If you smoke, quit smoking. If you need help quitting, ask your health care provider.  Avoid caffeine.  Avoid alcohol.  Rest as needed. SEEK MEDICAL CARE IF:   You have new symptoms.  You cough up pus.  Your cough does not get better after 2-3 weeks, or your cough gets worse.  You cannot control your cough with suppressant medicines and you are losing sleep.  You develop pain that is getting worse or pain that is not controlled with pain medicines.  You have a fever.  You have unexplained weight loss.  You have night sweats. SEEK IMMEDIATE MEDICAL CARE IF:  You cough up blood.  You have difficulty breathing.  Your heartbeat is very fast.   This information is not intended to replace advice given to you by your health care provider. Make sure you discuss any questions you have with your health care provider.   Document Released: 03/20/2011 Document Revised: 06/12/2015 Document  Reviewed: 11/28/2014 Elsevier Interactive Patient Education [employer].

## 2015-10-05 NOTE — Progress Notes (Signed)
Subjective:  This chart was scribed for Wesley Ray, MD by [facility], medical scribe at [facility].The patient was seen in exam room 02 and the patient's care was started at 2:53 PM.   Patient ID: Wesley Mccarthy, male    DOB: 1951-06-29, 64 y.o.   MRN: [id number] Chief Complaint  Patient presents with  . Cough    congestion , recheck   HPI HPI Comments: Wesley Mccarthy is a 64 y.o. male who presents to [facility] for a cough and congestion recheck. Seen by Dr. Lorelei Pont two days ago for a cough and congestion ongoing for two days. Suspected viral URI prescribed tessalon Perles. Today he is still congested, wakes with red eyes, loss of appetite. No measured fever or chills since last seen. Feels his symptoms are unchanged. He is taking his Best boy and one OTC medication. Able to sleep at night. No sick contacts. Works in [employer].  Wt Readings from Last 3 Encounters:  10/05/15 220 lb (99.791 kg)  10/03/15 226 lb 6.4 oz (102.694 kg)  06/20/15 219 lb (99.338 kg)   Patient Active Problem List   Diagnosis Date Noted  . Anemia 08/22/2013  . 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 Stasis dermatitis of both legs 12/21/2012  . Edema 12/21/2012  . Seizure disorder ([place]) 08/26/2012  . HTN (hypertension) 11/11/2011  . Hyperlipemia 11/11/2011  . Gout 11/11/2011  . Obesity 11/11/2011   Past Medical History  Diagnosis Date  . Seizures ([place])   . Arthritis   . Allergy   . Hypertension   . Hyperlipidemia    Past Surgical History  Procedure Laterality Date  . Appendectomy    . Tonsillectomy     Allergies  Allergen Reactions  . 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  Marland Kitchen Penicillins    Prior to Admission medications   Medication Sig Start Date End Date Taking? Authorizing Provider  allopurinol (ZYLOPRIM) 300 MG tablet TAKE 1 TABLET (300 MG TOTAL) BY MOUTH DAILY. 06/20/15   Posey Boyer, MD  benzonatate  (TESSALON) 100 MG capsule Take 1 capsule (100 mg total) by mouth 3 (three) times daily as needed for cough. 10/03/15   Gay Filler Copland, MD  hydrochlorothiazide (HYDRODIURIL) 25 MG tablet TAKE 1 TABLET (25 MG TOTAL) BY MOUTH DAILY. 06/20/15   Posey Boyer, MD  lisinopril (PRINIVIL,ZESTRIL) 40 MG tablet Take 1 tablet (40 mg total) by mouth daily. 06/20/15   Posey Boyer, MD  PHENobarbital (LUMINAL) 97.2 MG tablet Take 1 tablet (97.2 mg total) by mouth daily. Please see DR. Copland for a level prior to further refills 06/20/15   Posey Boyer, MD  Potassium Chloride ER 20 MEQ TBCR Take 1 tablets by mouth daily to twice daily as directed. 06/20/15   Posey Boyer, MD  pravastatin (PRAVACHOL) 40 MG tablet Take 1 tablet (40 mg total) by mouth daily. 06/20/15   Posey Boyer, MD   Social History   Social History  . Marital Status: Married    Spouse Name: N/A  . Number of Children: N/A  . Years of Education: N/A   Occupational History  . Not on file.   Social History Main Topics  . Smoking status: Never Smoker   . Smokeless tobacco: Not on file  . Alcohol Use: No  . Drug Use: No  . Sexual Activity: Yes    Birth Control/ Protection: None  Other Topics Concern  . Not on file   Social History Narrative   Married. Education: [employer].    Review of Systems  Constitutional: Positive for appetite change.  HENT: Positive for congestion.   Eyes: Positive for redness.  Respiratory: Positive for cough.       Objective:  BP 100/70 mmHg  Pulse 100  Temp(Src) 99.3 F (37.4 C) (Oral)  Resp 20  Ht 5\' 9"  (1.753 m)  Wt 220 lb (99.791 kg)  BMI 32.47 kg/m2  SpO2 95% Physical Exam  Constitutional: He is oriented to person, place, and time. He appears well-developed and well-nourished. No distress.  HENT:  Head: Normocephalic and atraumatic.  Right Ear: Tympanic membrane, external ear and ear canal normal.  Left Ear: Tympanic membrane, external ear and ear canal normal.  Nose: No  rhinorrhea.  Mouth/Throat: Oropharynx is clear and moist and mucous membranes are normal. No oropharyngeal exudate or posterior oropharyngeal erythema.  Eyes: Conjunctivae are normal. Pupils are equal, round, and reactive to light.  Neck: Normal range of motion. Neck supple.  Cardiovascular: Normal rate, regular rhythm, normal heart sounds and intact distal pulses.   No murmur heard. Pulmonary/Chest: Effort normal and breath sounds normal. No respiratory distress. He has no wheezes. He has no rhonchi. He has no rales.  Abdominal: Soft. There is no tenderness.  Musculoskeletal: Normal range of motion.  Lymphadenopathy:    He has no cervical adenopathy.  Neurological: He is alert and oriented to person, place, and time.  Skin: Skin is warm and dry. No rash noted.  Psychiatric: He has a normal mood and affect. His behavior is normal.  Nursing note and vitals reviewed.     Assessment & Plan:   Wesley Mccarthy is a 64 y.o. male Acute upper respiratory infection - Plan: azithromycin (ZITHROMAX) 250 MG tablet  History of pneumonia - Plan: azithromycin (ZITHROMAX) 250 MG tablet  Suspected viral illness currently, symptomatic care discussed as in AVS.With hx of pneumonia - Zpak Rx to fill if not improving in next week, but RTC precautions discussed if worsening.  Meds ordered this encounter  Medications  . azithromycin (ZITHROMAX) 250 MG tablet    Sig: Take 2 pills by mouth on day 1, then 1 pill by mouth per day on days 2 through 5.    Dispense:  6 tablet    Refill:  0   Patient Instructions  Saline nasal spray atleast 4 times per day, over the counter mucinex or mucinex DM or tessalon if needed for cough.  Drink plenty of fluids.  If not improving into next week - can start antibiotic.  Return to the clinic or go to the nearest emergency room if any of your symptoms worsen or new symptoms occur.  Upper Respiratory Infection, Adult Most upper respiratory infections (URIs) are a viral  infection of the air passages leading to the lungs. A URI affects the nose, throat, and upper air passages. The most common type of URI is nasopharyngitis and is typically referred to as "the common cold." URIs run their course and usually go away on their own. Most of the time, a URI does not require medical attention, but sometimes a bacterial infection in the upper airways can follow a viral infection. This is called a secondary infection. Sinus and middle ear infections are common types of secondary upper respiratory infections. Bacterial pneumonia can also complicate a URI. A URI can worsen asthma and chronic obstructive pulmonary disease (COPD). Sometimes, these complications can require emergency medical  care and may be life threatening.  CAUSES Almost all URIs are caused by viruses. A virus is a type of germ and can spread from one person to another.  RISKS FACTORS You may be at risk for a URI if:   You smoke.   You have chronic heart or lung disease.  You have a weakened defense (immune) system.   You are very young or very old.   You have nasal allergies or asthma.  You work in crowded or poorly ventilated areas.  You work in health care facilities or schools. SIGNS AND SYMPTOMS  Symptoms typically develop 2-3 days after you come in contact with a cold virus. Most viral URIs last 7-10 days. However, viral URIs from the influenza virus (flu virus) can last 14-18 days and are typically more severe. Symptoms may include:   Runny or stuffy (congested) nose.   Sneezing.   Cough.   Sore throat.   Headache.   Fatigue.   Fever.   Loss of appetite.   Pain in your forehead, behind your eyes, and over your cheekbones (sinus pain).  Muscle aches.  DIAGNOSIS  Your health care provider may diagnose a URI by:  Physical exam.  Tests to check that your symptoms are not due to another condition such as:  Strep  throat.  Sinusitis.  Pneumonia.  Asthma. TREATMENT  A URI goes away on its own with time. It cannot be cured with medicines, but medicines may be prescribed or recommended to relieve symptoms. Medicines may help:  Reduce your fever.  Reduce your cough.  Relieve nasal congestion. HOME CARE INSTRUCTIONS   Take medicines only as directed by your health care provider.   Gargle warm saltwater or take cough drops to comfort your throat as directed by your health care provider.  Use a warm mist humidifier or inhale steam from a shower to increase air moisture. This may make it easier to breathe.  Drink enough fluid to keep your urine clear or pale yellow.   Eat soups and other clear broths and maintain good nutrition.   Rest as needed.   Return to work when your temperature has returned to normal or as your health care provider advises. You may need to stay home longer to avoid infecting others. You can also use a face mask and careful hand washing to prevent spread of the virus.  Increase the usage of your inhaler if you have asthma.   Do not use any tobacco products, including cigarettes, chewing tobacco, or electronic cigarettes. If you need help quitting, ask your health care provider. PREVENTION  The best way to protect yourself from getting a cold is to practice good hygiene.   Avoid oral or hand contact with people with cold symptoms.   Wash your hands often if contact occurs.  There is no clear evidence that vitamin C, vitamin E, echinacea, or exercise reduces the chance of developing a cold. However, it is always recommended to get plenty of rest, exercise, and practice good nutrition.  SEEK MEDICAL CARE IF:   You are getting worse rather than better.   Your symptoms are not controlled by medicine.   You have chills.  You have worsening shortness of breath.  You have brown or red mucus.  You have yellow or brown nasal discharge.  You have pain in your  face, especially when you bend forward.  You have a fever.  You have swollen neck glands.  You have pain while swallowing.  You  have white areas in the back of your throat. SEEK IMMEDIATE MEDICAL CARE IF:   You have severe or persistent:  Headache.  Ear pain.  Sinus pain.  Chest pain.  You have chronic lung disease and any of the following:  Wheezing.  Prolonged cough.  Coughing up blood.  A change in your usual mucus.  You have a stiff neck.  You have changes in your:  Vision.  Hearing.  Thinking.  Mood. MAKE SURE YOU:   Understand these instructions.  Will watch your condition.  Will get help right away if you are not doing well or get worse.   This information is not intended to replace advice given to you by your health care provider. Make sure you discuss any questions you have with your health care provider.   Document Released: 03/17/2001 Document Revised: 02/05/2015 Document Reviewed: 12/27/2013 Elsevier Interactive Patient Education 2016 Elsevier Inc. Cough, Adult Coughing is a reflex that clears your throat and your airways. Coughing helps to heal and protect your lungs. It is normal to cough occasionally, but a cough that happens with other symptoms or lasts a long time may be a sign of a condition that needs treatment. A cough may last only 2-3 weeks (acute), or it may last longer than 8 weeks (chronic). CAUSES Coughing is commonly caused by:  Breathing in substances that irritate your lungs.  A viral or bacterial respiratory infection.  Allergies.  Asthma.  Postnasal drip.  Smoking.  Acid backing up from the stomach into the esophagus (gastroesophageal reflux).  Certain medicines.  Chronic lung problems, including COPD (or rarely, lung cancer).  Other medical conditions such as heart failure. HOME CARE INSTRUCTIONS  Pay attention to any changes in your symptoms. Take these actions to help with your discomfort:  Take  medicines only as told by your health care provider.  If you were prescribed an antibiotic medicine, take it as told by your health care provider. Do not stop taking the antibiotic even if you start to feel better.  Talk with your health care provider before you take a cough suppressant medicine.  Drink enough fluid to keep your urine clear or pale yellow.  If the air is dry, use a cold steam vaporizer or humidifier in your bedroom or your home to help loosen secretions.  Avoid anything that causes you to cough at work or at home.  If your cough is worse at night, try sleeping in a semi-upright position.  Avoid cigarette smoke. If you smoke, quit smoking. If you need help quitting, ask your health care provider.  Avoid caffeine.  Avoid alcohol.  Rest as needed. SEEK MEDICAL CARE IF:   You have new symptoms.  You cough up pus.  Your cough does not get better after 2-3 weeks, or your cough gets worse.  You cannot control your cough with suppressant medicines and you are losing sleep.  You develop pain that is getting worse or pain that is not controlled with pain medicines.  You have a fever.  You have unexplained weight loss.  You have night sweats. SEEK IMMEDIATE MEDICAL CARE IF:  You cough up blood.  You have difficulty breathing.  Your heartbeat is very fast.   This information is not intended to replace advice given to you by your health care provider. Make sure you discuss any questions you have with your health care provider.   Document Released: 03/20/2011 Document Revised: 06/12/2015 Document Reviewed: 11/28/2014 Elsevier Interactive Patient Education [employer].  I personally performed the services described in this documentation, which was scribed in my presence. The recorded information has been reviewed and considered, and addended by me as needed.    By signing my name below, I, Nadim Abuhashem, attest that this documentation has been  prepared under the direction and in the presence of Wesley Ray, MD.  Electronically Signed: Lora Havens, medical scribe. 10/05/2015, 3:02 PM.

## 2015-10-14 ENCOUNTER — Ambulatory Visit (INDEPENDENT_AMBULATORY_CARE_PROVIDER_SITE_OTHER): Payer: Managed Care, Other (non HMO) | Admitting: Emergency Medicine

## 2015-10-14 ENCOUNTER — Ambulatory Visit (INDEPENDENT_AMBULATORY_CARE_PROVIDER_SITE_OTHER): Payer: Managed Care, Other (non HMO)

## 2015-10-14 VITALS — BP 164/94 | HR 74 | Temp 98.5°F | Resp 18 | Ht 67.5 in | Wt 226.6 lb

## 2015-10-14 DIAGNOSIS — M2392 Unspecified internal derangement of left knee: Secondary | ICD-10-CM

## 2015-10-14 MED ORDER — OXYCODONE-ACETAMINOPHEN 5-325 MG PO TABS: 1.0000 | ORAL_TABLET | ORAL | Status: DC | PRN

## 2015-10-14 NOTE — Progress Notes (Signed)
Subjective:  Patient ID: Wesley Mccarthy, male    DOB: 1951/01/08  Age: 65 y.o. MRN: [id number]  CC: Knee Pain   HPI Sklyer Bage presents   Patient slipped on the ice and landed on his left knee. Pain and swelling of left knee and is unable to ambulate without external support. He has no history of prior knee injury. He's markedly distress  History Patriot has a past medical history of Seizures ([place]); Arthritis; Allergy; Hypertension; and Hyperlipidemia.   He has past surgical history that includes Appendectomy and Tonsillectomy.   His  family history includes Cancer in his father; Heart disease in his mother.  He   reports that he has never smoked. He does not have any smokeless tobacco history on file. He reports that he does not drink alcohol or use illicit drugs.  Outpatient Prescriptions Prior to Visit  Medication Sig Dispense Refill  . allopurinol (ZYLOPRIM) 300 MG tablet TAKE 1 TABLET (300 MG TOTAL) BY MOUTH DAILY. 90 tablet 3  . azithromycin (ZITHROMAX) 250 MG tablet Take 2 pills by mouth on day 1, then 1 pill by mouth per day on days 2 through 5. 6 tablet 0  . benzonatate (TESSALON) 100 MG capsule Take 1 capsule (100 mg total) by mouth 3 (three) times daily as needed for cough. 40 capsule 0  . hydrochlorothiazide (HYDRODIURIL) 25 MG tablet TAKE 1 TABLET (25 MG TOTAL) BY MOUTH DAILY. 90 tablet 3  . lisinopril (PRINIVIL,ZESTRIL) 40 MG tablet Take 1 tablet (40 mg total) by mouth daily. 90 tablet 3  . PHENobarbital (LUMINAL) 97.2 MG tablet Take 1 tablet (97.2 mg total) by mouth daily. Please see DR. Copland for a level prior to further refills 90 tablet 1  . Potassium Chloride ER 20 MEQ TBCR Take 1 tablets by mouth daily to twice daily as directed. 90 tablet 3  . pravastatin (PRAVACHOL) 40 MG tablet Take 1 tablet (40 mg total) by mouth daily. 90 tablet 3   No facility-administered medications prior to visit.    Social History   Social History  . Marital Status: Married   Spouse Name: N/A  . Number of Children: N/A  . Years of Education: N/A   Social History Main Topics  . Smoking status: Never Smoker   . Smokeless tobacco: None  . Alcohol Use: No  . Drug Use: No  . Sexual Activity: Yes    Birth Control/ Protection: None   Other Topics Concern  . None   Social History Narrative   Married. Education: [employer].      Review of Systems  Constitutional: Negative for fever, chills and appetite change.  HENT: Negative for congestion, ear pain, postnasal drip, sinus pressure and sore throat.   Eyes: Negative for pain and redness.  Respiratory: Negative for cough, shortness of breath and wheezing.   Cardiovascular: Negative for leg swelling.  Gastrointestinal: Negative for nausea, vomiting, abdominal pain, diarrhea, constipation and blood in stool.  Endocrine: Negative for polyuria.  Genitourinary: Negative for dysuria, urgency, frequency and flank pain.  Musculoskeletal: Positive for joint swelling. Negative for gait problem.  Skin: Negative for rash.  Neurological: Negative for weakness and headaches.  Psychiatric/Behavioral: Negative for confusion and decreased concentration. The patient is not nervous/anxious.     Objective:  BP 164/94 mmHg  Pulse 74  Temp(Src) 98.5 F (36.9 C) (Oral)  Resp 18  Ht 5' 7.5" (1.715 m)  Wt 226 lb 9.6 oz (102.785 kg)  BMI 34.95 kg/m2  SpO2 97%  Physical  Exam  Constitutional: He is oriented to person, place, and time. He appears well-developed and well-nourished.  HENT:  Head: Normocephalic and atraumatic.  Eyes: Conjunctivae are normal. Pupils are equal, round, and reactive to light.  Pulmonary/Chest: Effort normal.  Musculoskeletal: He exhibits no edema.       Left knee: He exhibits decreased range of motion, swelling and effusion. He exhibits no deformity and normal alignment. Tenderness found.  Neurological: He is alert and oriented to person, place, and time.  Skin: Skin is dry.  Psychiatric: He has a  normal mood and affect. His behavior is normal. Thought content normal.      Assessment & Plan:   Bocephus was seen today for knee pain.  Diagnoses and all orders for this visit:  Internal derangement of left knee -     DG Knee Complete 4 Views Left; Future  Other orders -     oxyCODONE-acetaminophen (ROXICET) 5-325 MG tablet; Take 1-2 tablets by mouth every 4 (four) hours as needed for severe pain.  I am having Mr. Paulick start on oxyCODONE-acetaminophen. I am also having him maintain his PHENobarbital, allopurinol, lisinopril, Potassium Chloride ER, pravastatin, hydrochlorothiazide, benzonatate, and azithromycin.  Meds ordered this encounter  Medications  . oxyCODONE-acetaminophen (ROXICET) 5-325 MG tablet    Sig: Take 1-2 tablets by mouth every 4 (four) hours as needed for severe pain.    Dispense:  30 tablet    Refill:  0    Appropriate red flag conditions were discussed with the patient as well as actions that should be taken.  Patient expressed his understanding.  Follow-up: Return if symptoms worsen or fail to improve.  Roselee Culver, MD   [facility] reading (PRIMARY) by  Dr. Ouida Sills.  DJD knee.

## 2015-10-14 NOTE — Patient Instructions (Signed)

## 2015-10-16 ENCOUNTER — Other Ambulatory Visit: Payer: Self-pay | Admitting: Family Medicine

## 2015-10-17 ENCOUNTER — Ambulatory Visit: Payer: Managed Care, Other (non HMO) | Admitting: Neurology

## 2015-12-16 ENCOUNTER — Other Ambulatory Visit: Payer: Self-pay | Admitting: Family Medicine

## 2015-12-17 ENCOUNTER — Other Ambulatory Visit: Payer: Self-pay | Admitting: Family Medicine

## 2015-12-18 NOTE — Telephone Encounter (Signed)
Needs seen in near future for further refills.  I last saw 6 mo ago.

## 2015-12-20 NOTE — Telephone Encounter (Signed)
Notified pt of RF and need for f/up. Pt agreed.

## 2015-12-21 ENCOUNTER — Other Ambulatory Visit: Payer: Self-pay | Admitting: Family Medicine

## 2015-12-21 MED ORDER — PHENOBARBITAL 97.2 MG PO TABS: 97.2000 mg | ORAL_TABLET | Freq: Every day | ORAL | Status: DC

## 2016-02-18 ENCOUNTER — Other Ambulatory Visit: Payer: Self-pay

## 2016-02-18 DIAGNOSIS — I10 Essential (primary) hypertension: Secondary | ICD-10-CM

## 2016-02-18 MED ORDER — POTASSIUM CHLORIDE ER 20 MEQ PO TBCR: EXTENDED_RELEASE_TABLET | ORAL | Status: DC

## 2016-03-19 ENCOUNTER — Ambulatory Visit (INDEPENDENT_AMBULATORY_CARE_PROVIDER_SITE_OTHER): Payer: Managed Care, Other (non HMO) | Admitting: Physician Assistant

## 2016-03-19 VITALS — BP 140/78 | HR 64 | Temp 97.7°F | Resp 18 | Ht 67.5 in | Wt 226.0 lb

## 2016-03-19 DIAGNOSIS — I10 Essential (primary) hypertension: Secondary | ICD-10-CM | POA: Diagnosis not present

## 2016-03-19 DIAGNOSIS — E785 Hyperlipidemia, unspecified: Secondary | ICD-10-CM

## 2016-03-19 DIAGNOSIS — Z23 Encounter for immunization: Secondary | ICD-10-CM

## 2016-03-19 DIAGNOSIS — G40909 Epilepsy, unspecified, not intractable, without status epilepticus: Secondary | ICD-10-CM

## 2016-03-19 DIAGNOSIS — Z79899 Other long term (current) drug therapy: Secondary | ICD-10-CM | POA: Diagnosis not present

## 2016-03-19 LAB — CBC
HCT: 39 % (ref 38.5–50.0)
Hemoglobin: 13 g/dL — ABNORMAL LOW (ref 13.2–17.1)
MCH: 30.8 pg (ref 27.0–33.0)
MCHC: 33.3 g/dL (ref 32.0–36.0)
MCV: 92.4 fL (ref 80.0–100.0)
MPV: 10.5 fL (ref 7.5–12.5)
Platelets: 205 10*3/uL (ref 140–400)
RBC: 4.22 MIL/uL (ref 4.20–5.80)
RDW: 13.2 % (ref 11.0–15.0)
WBC: 5.6 10*3/uL (ref 3.8–10.8)

## 2016-03-19 LAB — FOLATE: FOLATE: 5.7 ng/mL (ref >5.4)

## 2016-03-19 MED ORDER — HYDROCHLOROTHIAZIDE 25 MG PO TABS: ORAL_TABLET | ORAL | Status: DC

## 2016-03-19 MED ORDER — PHENOBARBITAL 97.2 MG PO TABS: 97.2000 mg | ORAL_TABLET | Freq: Every day | ORAL | Status: DC

## 2016-03-19 NOTE — Patient Instructions (Addendum)
You will get a phone call to make appt with neurology to discuss discontinuing your phenobarbital. Continue meds for blood pressure and cholesterol. Try to find out when you had your colonoscopy -- [facility] is on [address], try calling them. May also want to see if you can establish with primary provider at [facility] or [place]. Return as needed.    IF you received an x-ray today, you will receive an invoice from [facility]. Please contact [facility] at [phone] with questions or concerns regarding your invoice.   IF you received labwork today, you will receive an invoice from [facility]. Please contact [facility] at [phone] with questions or concerns regarding your invoice.   Our billing staff will not be able to assist you with questions regarding bills from these companies.  You will be contacted with the lab results as soon as they are available. The fastest way to get your results is to activate your My Chart account. Instructions are located on the last page of this paperwork. If you have not heard from [place] regarding the results in 2 weeks, please contact this office.

## 2016-03-19 NOTE — Progress Notes (Signed)
[facility] [address], [place] [address] [phone]  Date:  03/19/2016   Name:  Wesley Mccarthy   DOB:  11-28-1950   MRN:  [id number]  PCP:  Ruben Reason, MD    Chief Complaint: Medication Refill   History of Present Illness:  This is a 65 y.o. male with PMH seizure disorder, HTN, HLD, gout who is presenting for med refills. Was a patient of Dr. Clayborn Heron who has since retired. He is needing refill of phenobarbital which he takes daily for seizure disorder and has apparently been on since the third grade. He can't remember his last seizure -- it has been decades. He used to be on a second agent when he was younger but came off that many years ago. He does not have a neurologist currently.  He is also needing refill of hctz. Last visit with Dr. Linna Darner he was taken off lasix and has done well with this. He takes lisinopril 40 mg for htn as well. Last visit his potassium was low at 3.0 -- he was advised to start back on potassium supplement twice a day. He was advised to come back for lab only visit but did not return until now.  He believes he has had 2 colonoscopies since age 2. No record in epic. He thinks they were done at [facility]. He states he will call and get this information.   He has had pna vaccine before but unsure of date. It has been >10 years since last tdap.  Review of Systems:  Review of Systems See HPI   Patient Active Problem List   Diagnosis Date Noted  . Anemia 08/22/2013  . 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 Stasis dermatitis of both legs 12/21/2012  . Edema 12/21/2012  . Seizure disorder ([place]) 08/26/2012  . HTN (hypertension) 11/11/2011  . Hyperlipemia 11/11/2011  . Gout 11/11/2011  . Obesity 11/11/2011    Prior to Admission medications   Medication Sig Start Date End Date Taking? Authorizing Provider  allopurinol (ZYLOPRIM) 300 MG tablet TAKE 1 TABLET (300 MG TOTAL) BY MOUTH DAILY. 06/20/15  Yes Posey Boyer, MD  hydrochlorothiazide  (HYDRODIURIL) 25 MG tablet TAKE 1 TABLET (25 MG TOTAL) BY MOUTH DAILY. 06/20/15  Yes Posey Boyer, MD  lisinopril (PRINIVIL,ZESTRIL) 40 MG tablet Take 1 tablet (40 mg total) by mouth daily. 06/20/15  Yes Posey Boyer, MD  PHENobarbital (LUMINAL) 97.2 MG tablet Take 1 tablet (97.2 mg total) by mouth at bedtime. 12/21/15  Yes Posey Boyer, MD  Potassium Chloride ER 20 MEQ TBCR Take 1 tablets by mouth daily to twice daily as directed. 02/18/16  Yes Stephanie D English, PA  pravastatin (PRAVACHOL) 40 MG tablet Take 1 tablet (40 mg total) by mouth daily. 06/20/15  Yes Posey Boyer, MD                         Allergies  Allergen Reactions  . 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  Marland Kitchen Penicillins     Past Surgical History  Procedure Laterality Date  . Appendectomy    . Tonsillectomy      Social History  Substance Use Topics  . Smoking status: Never Smoker   . Smokeless tobacco: None  . Alcohol Use: No    Family History  Problem Relation Age of Onset  . Heart disease Mother     Had a CABG  .  Cancer Father     colon cancer    Medication list has been reviewed and updated.  Physical Examination:  Physical Exam  Constitutional: He is oriented to person, place, and time. He appears well-developed and well-nourished. No distress.  HENT:  Head: Normocephalic and atraumatic.  Right Ear: Hearing normal.  Left Ear: Hearing normal.  Nose: Nose normal.  Eyes: Conjunctivae and lids are normal. Right eye exhibits no discharge. Left eye exhibits no discharge. No scleral icterus.  Neck: Carotid bruit is not present.  Cardiovascular: Normal rate, regular rhythm, normal heart sounds and normal pulses.   No murmur heard. Pulmonary/Chest: Effort normal. No respiratory distress. He has no wheezes. He has no rhonchi. He has no rales.  Musculoskeletal: Normal range of motion.  Neurological: He is alert and oriented to person, place, and time.  Skin:  Skin is warm, dry and intact. No lesion and no rash noted.  Psychiatric: He has a normal mood and affect. His speech is normal and behavior is normal. Thought content normal.   BP 140/78 mmHg  Pulse 64  Temp(Src) 97.7 F (36.5 C) (Oral)  Resp 18  Ht 5' 7.5" (1.715 m)  Wt 226 lb (102.513 kg)  BMI 34.85 kg/m2  SpO2 98%  Assessment and Plan:  1. Seizure disorder ([place]) 2. Long-term use of high-risk medication Pt takes phenobarbital daily for seizure disorder, however he has not had seizure in decades. Labs below for monitoring. Will refer to neurologist to discuss discontinuing phenobarbital. We discussed if he has not had appt in 3 months, he can call and we would refill to get him through to his appt. - Ambulatory referral to Neurology - PHENobarbital (LUMINAL) 97.2 MG tablet; Take 1 tablet (97.2 mg total) by mouth at bedtime.  Dispense: 30 tablet; Refill: 2 - Comprehensive metabolic panel - CBC - Folate  3. Essential hypertension - hydrochlorothiazide (HYDRODIURIL) 25 MG tablet; TAKE 1 TABLET (25 MG TOTAL) BY MOUTH DAILY.  Dispense: 90 tablet; Refill: 3  4. Need for Tdap vaccination - Tdap vaccine greater than or equal to 7yo IM  Pt will call [facility] to determine last colonoscopy. Will also establish care with PCP at either [facility] or [facility].   Benjaman Pott Drenda Freeze, MHS [facility]  03/19/2016

## 2016-03-20 LAB — COMPREHENSIVE METABOLIC PANEL
ALT: 17 U/L (ref 9–46)
AST: 21 U/L (ref 10–35)
Albumin: 4 g/dL (ref 3.6–5.1)
Alkaline Phosphatase: 108 U/L (ref 40–115)
BUN: 19 mg/dL (ref 7–25)
CHLORIDE: 101 mmol/L (ref 98–110)
CO2: 21 mmol/L (ref 20–31)
CREATININE: 1.43 mg/dL — AB (ref 0.70–1.25)
Calcium: 9.3 mg/dL (ref 8.6–10.3)
GLUCOSE: 116 mg/dL — AB (ref 65–99)
POTASSIUM: 3.1 mmol/L — AB (ref 3.5–5.3)
SODIUM: 141 mmol/L (ref 135–146)
Total Bilirubin: 0.2 mg/dL (ref 0.2–1.2)
Total Protein: 7 g/dL (ref 6.1–8.1)

## 2016-04-08 ENCOUNTER — Telehealth: Payer: Self-pay | Admitting: *Deleted

## 2016-04-08 NOTE — Telephone Encounter (Signed)
Unable to reach patient.

## 2016-05-13 ENCOUNTER — Ambulatory Visit (INDEPENDENT_AMBULATORY_CARE_PROVIDER_SITE_OTHER): Payer: Medicare Other | Admitting: Family Medicine

## 2016-05-13 VITALS — BP 142/84 | HR 48 | Temp 98.2°F | Resp 18 | Ht 68.5 in | Wt 220.0 lb

## 2016-05-13 DIAGNOSIS — K5900 Constipation, unspecified: Secondary | ICD-10-CM | POA: Diagnosis not present

## 2016-05-13 DIAGNOSIS — E876 Hypokalemia: Secondary | ICD-10-CM | POA: Diagnosis not present

## 2016-05-13 DIAGNOSIS — G40909 Epilepsy, unspecified, not intractable, without status epilepticus: Secondary | ICD-10-CM

## 2016-05-13 MED ORDER — DOCUSATE SODIUM 100 MG PO CAPS: 100.0000 mg | ORAL_CAPSULE | Freq: Two times a day (BID) | ORAL | 0 refills | Status: DC

## 2016-05-13 MED ORDER — POLYETHYLENE GLYCOL 3350 17 GM/SCOOP PO POWD: 17.0000 g | Freq: Two times a day (BID) | ORAL | 1 refills | Status: DC | PRN

## 2016-05-13 NOTE — Patient Instructions (Addendum)
Take the Colace as a stool softener daily.  Use the Miralax to help you go to the bathroom.  We are rechecking your potassium today.    Referral to Neurology today.  Call and let us know your phone number.    IF you received an x-ray today, you will receive an invoice from [facility]. Please contact [facility]iology at [phone] with questions or concerns regarding your invoice.   IF you received labwork today, you will receive an invoice from [employer]. Please contact [facility] at [phone] with questions or concerns regarding your invoice.   Our billing staff will not be able to assist you with questions regarding bills from these companies.  You will be contacted with the lab results as soon as they are available. The fastest way to get your results is to activate your My Chart account. Instructions are located on the last page of this paperwork. If you have not heard from [place] regarding the results in 2 weeks, please contact this office.

## 2016-05-13 NOTE — Progress Notes (Signed)
Wesley Mccarthy is a 65 y.o. male who presents to [facility] today for constipation:  1.  Constipation:  Patient reports that wife states he's "Impacted."  Not having BM's as often as he should be.  Reports last BM was yesterday.  Becoming a little more irregular.  No straining.  Occasional hard stools.  No nausea or vomitng.  Eating and drinking well.    ROS as above.    PMH reviewed. Patient is a nonsmoker.   Past Medical History:  Diagnosis Date  . Allergy   . Arthritis   . Hyperlipidemia   . Hypertension   . Seizures ([place])    Past Surgical History:  Procedure Laterality Date  . APPENDECTOMY    . TONSILLECTOMY      Medications reviewed. Current Outpatient Prescriptions  Medication Sig Dispense Refill  . allopurinol (ZYLOPRIM) 300 MG tablet TAKE 1 TABLET (300 MG TOTAL) BY MOUTH DAILY. 90 tablet 3  . hydrochlorothiazide (HYDRODIURIL) 25 MG tablet TAKE 1 TABLET (25 MG TOTAL) BY MOUTH DAILY. 90 tablet 3  . lisinopril (PRINIVIL,ZESTRIL) 40 MG tablet Take 1 tablet (40 mg total) by mouth daily. 90 tablet 3  . PHENobarbital (LUMINAL) 97.2 MG tablet Take 1 tablet (97.2 mg total) by mouth at bedtime. 30 tablet 2  . Potassium Chloride ER 20 MEQ TBCR Take 1 tablets by mouth daily to twice daily as directed. 180 tablet 0  . pravastatin (PRAVACHOL) 40 MG tablet Take 1 tablet (40 mg total) by mouth daily. 90 tablet 3   No current facility-administered medications for this visit.      Physical Exam:  BP (!) 142/84 (BP Location: Right Arm, Patient Position: Sitting, Cuff Size: Small)   Pulse (!) 48   Temp 98.2 F (36.8 C)   Resp 18   Ht 5' 8.5" (1.74 m)   Wt 220 lb (99.8 kg)   SpO2 99%   BMI 32.96 kg/m  Gen:  Alert, cooperative patient who appears stated age in no acute distress.  Vital signs reviewed. HEENT: EOMI,  MMM Pulm:  Clear to auscultation bilaterally with good air movement.  No wheezes or rales noted.   Cardiac:  Regular rate and rhythm without murmur auscultated.  Good  S1/S2. Abd:  Soft/nondistended/nontender.  Good bowel sounds throughout all four quadrants.  No masses noted.  Exts: Non edematous BL  LE, warm and well perfused.   Assessment and Plan:  1.  Constipation: - treat with stool softener/miralax. - offered prune juice as option, he declined.  - FU if no improvement  2.  Seizure disorder: - long-time issue for patient.  On phenobarb.   - last visit here he was referred to neurology.  However, he was never contacted by neurology.  - I have put in referral again for Seizure D/O to assess for need for continued phenobarb.  Has been on this for years.    3.  Hypokalemia:   - rechecking Bmet today.  Last check was hypokalemic.

## 2016-05-14 LAB — BASIC METABOLIC PANEL
BUN: 20 mg/dL (ref 7–25)
CALCIUM: 9.1 mg/dL (ref 8.6–10.3)
CO2: 23 mmol/L (ref 20–31)
Chloride: 103 mmol/L (ref 98–110)
Creat: 1.53 mg/dL — ABNORMAL HIGH (ref 0.70–1.25)
Glucose, Bld: 81 mg/dL (ref 65–99)
Potassium: 3.8 mmol/L (ref 3.5–5.3)
SODIUM: 137 mmol/L (ref 135–146)

## 2016-06-04 ENCOUNTER — Ambulatory Visit (INDEPENDENT_AMBULATORY_CARE_PROVIDER_SITE_OTHER): Payer: Managed Care, Other (non HMO) | Admitting: Neurology

## 2016-06-04 ENCOUNTER — Encounter: Payer: Self-pay | Admitting: Neurology

## 2016-06-04 VITALS — BP 118/72 | HR 71 | Ht 68.5 in | Wt 219.4 lb

## 2016-06-04 DIAGNOSIS — G40009 Localization-related (focal) (partial) idiopathic epilepsy and epileptic syndromes with seizures of localized onset, not intractable, without status epilepticus: Secondary | ICD-10-CM | POA: Diagnosis not present

## 2016-06-04 DIAGNOSIS — G40909 Epilepsy, unspecified, not intractable, without status epilepticus: Secondary | ICD-10-CM | POA: Diagnosis not present

## 2016-06-04 MED ORDER — PHENOBARBITAL 64.8 MG PO TABS: 97.2000 mg | ORAL_TABLET | Freq: Every day | ORAL | 2 refills | Status: DC

## 2016-06-04 NOTE — Progress Notes (Addendum)
[facility] [address] [place]. [place] [address] [phone]       OFFICE CONSULT NOTE  Mr. Stevens Puentes Date of Birth:  1951-04-01 Medical Record Number:  [id number]   Referring MD:  Esmond Camper  Reason for Referral:  epilepsy  HPI: Mr. 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 He is unable to recall recall and his great details about his seizures. He states as far as he can remember he seizures are manifested by losing body control for a few seconds and episodes were triggered by extreme excitement. He denies any loss of consciousness, tongue bite, injury or postictal confusion. In fact he says he cannot remember the last time he had an episode and was more than 20-30 years ago. He states he was on phenobarbital and Dilantin for several years but Dilantin was discontinued and he has been on phenobarbital 97.4 mg daily for last several decades now. He seems to be tolerating this does fairly well without any sleepiness drowsiness or dizziness. He cannot remember and tell me if he had any visits with a neurology practice. He was seen recently by his primary physician who did not feel comfortable prescribing phenobarbital and advise him to see a neurologist to reconsider whether he still needed the patient cannot remember any history of childhood head injury, loss of consciousness, febrile illness. There is no family history of epilepsy. Patient's parents are deceased and he is only child so there is no eye witness available has witnessed any of these episodes. He is otherwise quite healthy and has no significant past medical history except hypertension and hyperlipidemia. He has never tried tapering his phenobarbital for stopping it. He did 1 set on all of it for a few days but was okay  ROS:   14 system review of systems is positive for easy bruising, bleeding, seizure, tumors sleep,  allergies, skin sensitivity and all other systems negative  PMH:  Past Medical History:  Diagnosis Date  . Allergy   . Arthritis   . Hyperlipidemia   . Hypertension   . Seizures ([place])     Social History:  Social History   Social History  . Marital status: Married    Spouse name: N/A  . Number of children: N/A  . Years of education: N/A   Occupational History  . Not on file.   Social History Main Topics  . Smoking status: Never Smoker  . Smokeless tobacco: Never Used  . Alcohol use No  . Drug use: No  . Sexual activity: Yes    Birth control/ protection: None   Other Topics Concern  . Not on file   Social History Narrative   Married. Education: [employer].     Medications:   Current Outpatient Prescriptions on File Prior to Visit  Medication Sig Dispense Refill  . allopurinol (ZYLOPRIM) 300 MG tablet TAKE 1 TABLET (300 MG TOTAL) BY MOUTH DAILY. 90 tablet 3  . docusate sodium (COLACE) 100 MG capsule Take 1 capsule (100 mg total) by mouth 2 (two) times daily. 30 capsule 0  . hydrochlorothiazide (HYDRODIURIL) 25 MG tablet TAKE 1 TABLET (25 MG TOTAL) BY MOUTH DAILY. 90 tablet 3  . lisinopril (PRINIVIL,ZESTRIL) 40 MG tablet Take 1 tablet (40 mg total) by mouth daily. 90 tablet 3  . polyethylene glycol powder (GLYCOLAX/MIRALAX) powder Take 17 g by mouth 2 (two) times daily as needed. 3350 g 1  .  Potassium Chloride ER 20 MEQ TBCR Take 1 tablets by mouth daily to twice daily as directed. 180 tablet 0  . pravastatin (PRAVACHOL) 40 MG tablet Take 1 tablet (40 mg total) by mouth daily. 90 tablet 3   No current facility-administered medications on file prior to visit.     Allergies:   Allergies  Allergen Reactions  . 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  Marland Kitchen Penicillins     Physical Exam General: well developed, well nourished middle-aged African-American male, seated, in no evident distress Head: head normocephalic and atraumatic.     Neck: supple with no carotid or supraclavicular bruits Cardiovascular: regular rate and rhythm, no murmurs Musculoskeletal: no deformity Skin:  no rash/petichiae Vascular:  Normal pulses all extremities  Neurologic Exam Mental Status: Awake and fully alert. Oriented to place and time. Recent and remote memory intact. Attention span, concentration and fund of knowledge appropriate. Mood and affect appropriate.  Cranial Nerves: Fundoscopic exam reveals sharp disc margins. Pupils equal, briskly reactive to light. Extraocular movements full without nystagmus. Visual fields full to confrontation. Hearing intact. Facial sensation intact. Face, tongue, palate moves normally and symmetrically.  Motor: Normal bulk and tone. Normal strength in all tested extremity muscles. Sensory.: intact to touch , pinprick , position and vibratory sensation.  Coordination: Rapid alternating movements normal in all extremities. Finger-to-nose and heel-to-shin performed accurately bilaterally. Gait and Station: Arises from chair without difficulty. Stance is normal. Gait demonstrates normal stride length and balance . Able to heel, toe and tandem walk without difficulty.  Reflexes: 1+ and symmetric. Toes downgoing.       ASSESSMENT: 16 year African-American male with lifelong history of possible seizures with onset in third grade but patient's description is not quite classical. He has been on phenobarbital lifelong.     PLAN: I had a long discussion with the patient with regards to his episodes of possible seizures. It is unclear from his history whether these are true seizures or episodes of cataplexy as most of these were brought on by emotions.. Since he has not had an episode for several decades I think it may be worthwhile tapering the phenobarbital gradually over the next 6-12 months. I recommend he reduce the dose to 64.8 mg daily for the next 3-6 months and further if tolerated. Check MRI scan of the brain  and EEG. We will try to get hold of any old prior neurological records if possible. Greater than 50% time during this 45 minute consultation visit was spent on counseling and coordination of care about his possible seizure episodes and treatment options. He will return for follow-up in 2 months or call earlier if necessary Antony Contras, MD  [facility] [address] [place], [place] [address]  Phone [phone] Fax [phone] Note: This document was prepared with digital dictation and possible smart phrase technology. Any transcriptional errors that result from this process are unintentional.  ADDENDUM : Office consult note from Dr. Floyde Parkins dated 03/10/12 reviewed. He had previously been followed by Dr. Ardath Sax.. As per the note patient and had some breakthrough seizures once when he had tried to taper himself off the phenobarbital. EEG was ordered at that visit but I do not have the report available. Note mentions that 2 of his children have seizures but patient surprisingly denied this today at my visit. Antony Contras, MD  [facility] [address] [place], [place] [address]  Phone [phone]  Fax [phone]

## 2016-06-04 NOTE — Patient Instructions (Signed)
I had a long discussion with the patient with regards to his episodes of possible seizures. It is unclear from his history whether these are true seizures or episodes of cataplexy as most of these were brought on by emotions.. Since he has not had an episode for several decades I think it may be worthwhile tapering the phenobarbital gradually over the next 6-12 months. I recommend he reduce the dose to 64.8 mg daily for the next 3-6 months and further if tolerated. Check MRI scan of the brain and EEG. We will try to get hold of any old prior neurological records if possible. He will return for follow-up in 2 months or call earlier if necessary

## 2016-06-05 ENCOUNTER — Ambulatory Visit (INDEPENDENT_AMBULATORY_CARE_PROVIDER_SITE_OTHER): Payer: Managed Care, Other (non HMO) | Admitting: Neurology

## 2016-06-05 DIAGNOSIS — G40009 Localization-related (focal) (partial) idiopathic epilepsy and epileptic syndromes with seizures of localized onset, not intractable, without status epilepticus: Secondary | ICD-10-CM | POA: Diagnosis not present

## 2016-06-10 ENCOUNTER — Other Ambulatory Visit: Payer: Self-pay | Admitting: Family Medicine

## 2016-06-10 DIAGNOSIS — I10 Essential (primary) hypertension: Secondary | ICD-10-CM

## 2016-06-24 ENCOUNTER — Other Ambulatory Visit: Payer: Managed Care, Other (non HMO)

## 2016-06-24 ENCOUNTER — Other Ambulatory Visit: Payer: Self-pay | Admitting: Physician Assistant

## 2016-06-24 DIAGNOSIS — I10 Essential (primary) hypertension: Secondary | ICD-10-CM

## 2016-06-30 ENCOUNTER — Other Ambulatory Visit: Payer: Medicare Other

## 2016-07-05 ENCOUNTER — Other Ambulatory Visit: Payer: Self-pay | Admitting: Family Medicine

## 2016-07-05 DIAGNOSIS — E785 Hyperlipidemia, unspecified: Secondary | ICD-10-CM

## 2016-07-20 ENCOUNTER — Other Ambulatory Visit: Payer: Self-pay | Admitting: Family Medicine

## 2016-07-20 DIAGNOSIS — M1A9XX0 Chronic gout, unspecified, without tophus (tophi): Secondary | ICD-10-CM

## 2016-07-22 ENCOUNTER — Ambulatory Visit (INDEPENDENT_AMBULATORY_CARE_PROVIDER_SITE_OTHER): Payer: Managed Care, Other (non HMO) | Admitting: Family Medicine

## 2016-07-22 VITALS — BP 122/80 | HR 80 | Temp 98.3°F | Resp 16 | Ht 68.25 in | Wt 221.0 lb

## 2016-07-22 DIAGNOSIS — R1310 Dysphagia, unspecified: Secondary | ICD-10-CM

## 2016-07-22 DIAGNOSIS — R1319 Other dysphagia: Secondary | ICD-10-CM

## 2016-07-22 NOTE — Patient Instructions (Addendum)
  It was good to see you again today!  We are going to refer you to a gastroenterologist.  They should call you for an appointment in the next week or so.     IF you received an x-ray today, you will receive an invoice from [facility]. Please contact [facility]iology at [phone] with questions or concerns regarding your invoice.   IF you received labwork today, you will receive an invoice from [employer]. Please contact [facility] at [phone] with questions or concerns regarding your invoice.   Our billing staff will not be able to assist you with questions regarding bills from these companies.  You will be contacted with the lab results as soon as they are available. The fastest way to get your results is to activate your My Chart account. Instructions are located on the last page of this paperwork. If you have not heard from [place] regarding the results in 2 weeks, please contact this office.

## 2016-07-22 NOTE — Progress Notes (Signed)
   Wesley Mccarthy is a 65 y.o. male who [facility] for dysphagia:  1.  Dysphagia:  Present for past 2 months or so. Happens roughly once a week with foods. Usually with food like steak or chicken, though has happened with softer food such as rice. He states that he has to try to chase the food down with liquid. Usually this does not work and he has to force himself to vomit. Denies any weight loss. States food gets caught in his throat. No odynophagia. No dysphagia with liquids. No abdominal pain or history of GERD/reflux. No family history of GI cancer.   ROS as above.   PMH reviewed. Patient is a nonsmoker.   Past Medical History:  Diagnosis Date  . Allergy   . Arthritis   . Hyperlipidemia   . Hypertension   . Seizures ([place])    Past Surgical History:  Procedure Laterality Date  . APPENDECTOMY    . TONSILLECTOMY      Medications reviewed. Current Outpatient Prescriptions  Medication Sig Dispense Refill  . allopurinol (ZYLOPRIM) 300 MG tablet TAKE 1 TABLET (300 MG TOTAL) BY MOUTH DAILY. 30 tablet 1  . docusate sodium (COLACE) 100 MG capsule Take 1 capsule (100 mg total) by mouth 2 (two) times daily. 30 capsule 0  . hydrochlorothiazide (HYDRODIURIL) 25 MG tablet TAKE 1 TABLET (25 MG TOTAL) BY MOUTH DAILY. 90 tablet 3  . lisinopril (PRINIVIL,ZESTRIL) 40 MG tablet TAKE 1 TABLET (40 MG TOTAL) BY MOUTH DAILY. 90 tablet 1  . PHENobarbital (LUMINAL) 64.8 MG tablet Take 1.5 tablets (97.2 mg total) by mouth at bedtime. 60 tablet 2  . polyethylene glycol powder (GLYCOLAX/MIRALAX) powder Take 17 g by mouth 2 (two) times daily as needed. 3350 g 1  . Potassium Chloride ER 20 MEQ TBCR TAKE 1 TABLETS BY MOUTH DAILY TO TWICE DAILY AS DIRECTED. 180 tablet 0  . pravastatin (PRAVACHOL) 40 MG tablet TAKE 1 TABLET (40 MG TOTAL) BY MOUTH DAILY. 90 tablet 0   No current facility-administered medications for this visit.      Physical Exam:  BP 122/80 (BP Location:  Right Arm, Patient Position: Sitting, Cuff Size: Normal)   Pulse 80   Temp 98.3 F (36.8 C) (Oral)   Resp 16   Ht 5' 8.25" (1.734 m)   Wt 221 lb (100.2 kg)   SpO2 99%   BMI 33.36 kg/m  Gen:  Patient sitting on exam table, appears stated age in no acute distress Head: Normocephalic atraumatic Eyes: EOMI, PERRL, sclera and conjunctiva non-erythematous Mouth: Mucosa membranes moist. Tonsils +2, nonenlarged, non-erythematous.  No oropharyngeal defect noted.  Neck: No cervical lymphadenopathy noted.  Supple without masses Heart:  RRR, no murmurs auscultated. Pulm:  Clear to auscultation bilaterally with good air movement.  No wheezes or rales noted.   Abd:  Soft/nondistended/nontender.  Good bowel sounds throughout all four quadrants.  No masses noted.      Assessment and Plan:  1.  Dysphagia: -Mostly with more solid foods such as meats. Has started to happen though with softer foods. -Plan to refer to GI as this is a new problem. -No red flags currently. -Referral placed today.

## 2016-07-23 ENCOUNTER — Telehealth: Payer: Self-pay | Admitting: Neurology

## 2016-07-23 NOTE — Telephone Encounter (Signed)
Is [employer] willing to   do MRI brain wo only. If so this is acceptable

## 2016-07-23 NOTE — Telephone Encounter (Signed)
Patient has Wesley Mccarthy and they have declined the request of [place] having a MR Brain w/wo contrast. Patient can't have MRI unless they pay out of pocket.  How would you like to proceed forward?

## 2016-07-24 ENCOUNTER — Encounter: Payer: Self-pay | Admitting: Gastroenterology

## 2016-07-24 ENCOUNTER — Other Ambulatory Visit: Payer: Self-pay

## 2016-07-24 DIAGNOSIS — G40009 Localization-related (focal) (partial) idiopathic epilepsy and epileptic syndromes with seizures of localized onset, not intractable, without status epilepticus: Secondary | ICD-10-CM

## 2016-07-24 NOTE — Telephone Encounter (Signed)
ok 

## 2016-07-24 NOTE — Telephone Encounter (Signed)
Spoke with Melissa T at [facility] this morning to see if  the insurance could approve the MR Wo. I went through their questions and needed more Clinical information. I faxed the notes over to [facility] for them to review and I will get an answer in the next couple business days if he was approved for the MRI.Wesley Mccarthy

## 2016-07-28 ENCOUNTER — Telehealth: Payer: Self-pay | Admitting: Neurology

## 2016-07-28 NOTE — Telephone Encounter (Signed)
[employer] review the office request and clinicals and they did not approve the MR wo. They are requesting a peer to peer. The phone number is [phone], the reference code is [id number]. They may ask for the customer ID which is [id number].

## 2016-07-28 NOTE — Telephone Encounter (Signed)
I spoke to medical director for peer to peer and was denied as per him Wesley Mccarthy will approve MRI brain only for refractory seizures or presurgical planning

## 2016-07-28 NOTE — Telephone Encounter (Signed)
I made a. Peer to peer. Review calle to medical Dir. for [employer] to get MRI scan of brainn approved for this patient but was denied as the [employer]. quoted ``MRI is  approved as per [place] guidelines only if patient had refractory seizures or for presurgical planning and [employer] MRI guidelines do not approve MRI for any other reasons. When I asked him to discuss these guidelines he advised me to go on their website and look up the guidelines which "" have been drafted after esteemed academic physicians came up with it``

## 2016-07-29 NOTE — Telephone Encounter (Signed)
I called patient to inform him that his insurance company is not going to approve the MR Brain wo and if he wants to pay out of pocket. He did not answer and I couldn't leave a voice mail due the patient mail box isn't set up.

## 2016-08-04 ENCOUNTER — Other Ambulatory Visit: Payer: Managed Care, Other (non HMO)

## 2016-08-25 ENCOUNTER — Encounter: Payer: Self-pay | Admitting: Neurology

## 2016-08-25 ENCOUNTER — Ambulatory Visit (INDEPENDENT_AMBULATORY_CARE_PROVIDER_SITE_OTHER): Payer: Managed Care, Other (non HMO) | Admitting: Neurology

## 2016-08-25 VITALS — BP 117/73 | HR 92 | Ht 68.25 in | Wt 220.2 lb

## 2016-08-25 DIAGNOSIS — R569 Unspecified convulsions: Secondary | ICD-10-CM

## 2016-08-25 NOTE — Progress Notes (Signed)
[facility] [address] [place]. 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 He is unable to recall recall and his great details about his seizures. He states as far as he can remember he seizures are manifested by losing body control for a few seconds and episodes were triggered by extreme excitement. He denies any loss of consciousness, tongue bite, injury or postictal confusion. In fact he says he cannot remember the last time he had an episode and was more than 20-30 years ago. He states he was on phenobarbital and Dilantin for several years but Dilantin was discontinued and he has been on phenobarbital 97.4 mg daily for last several decades now. He seems to be tolerating this does fairly well without any sleepiness drowsiness or dizziness. He cannot remember and tell me if he had any visits with a neurology practice. He was seen recently by his primary physician who did not feel comfortable prescribing phenobarbital and advise him to see a neurologist to reconsider whether he still needed the patient cannot remember any history of childhood head injury, loss of consciousness, febrile illness. There is no family history of epilepsy. Patient's parents are deceased and he is only child so there is no eye witness available has witnessed any of these episodes. He is otherwise quite healthy and has no significant past medical history except hypertension and hyperlipidemia. He has never tried tapering his phenobarbital for stopping it. He did 1 set on all of it for a few days but was okay Update 08/25/16 : He returns for follow-up after last visit 2 and half months ago. He is not had any  recurrent seizures. He is tolerating phenobarbital well at night without side effects. He had EEG done on 9/117 which was normal. MRI scan of the brain was denied by Wesley Mccarthy as per the medical Dir. at home I head. 2. Colonization stated as per their guidelines patient did not meet criteria for MRI. : Office consult note from Dr. Floyde Mccarthy dated 03/10/12 reviewed. He had previously been followed by Dr. Ardath Mccarthy.. As per the note patient and had some breakthrough seizures once when he had tried to taper himself off the phenobarbital. EEG was ordered at that visit but I do not have the report available. Note mentions that 2 of his children have seizures but patient surprisingly denied this today at my visit.    ROS:   14 system review of systems is positive for easy bruising, bleeding, seizure, tumors sleep, allergies, skin sensitivity and all other systems negative  PMH:  Past Medical History:  Diagnosis Date  . Allergy   . Arthritis   . Hyperlipidemia   . Hypertension   . Seizures ([place])     Social History:  Social History   Social History  . Marital status: Married    Spouse name: N/A  . Number of children: N/A  . Years of education: N/A   Occupational History  . Not on file.   Social History Main Topics  . Smoking status: Never Smoker  . Smokeless tobacco: Never Used  . Alcohol use No  . Drug use: No  . Sexual activity: Yes  Birth control/ protection: None   Other Topics Concern  . Not on file   Social History Narrative   Married. Education: [employer].     Medications:   Current Outpatient Prescriptions on File Prior to Visit  Medication Sig Dispense Refill  . allopurinol (ZYLOPRIM) 300 MG tablet TAKE 1 TABLET (300 MG TOTAL) BY MOUTH DAILY. 30 tablet 1  . docusate sodium (COLACE) 100 MG capsule Take 1 capsule (100 mg total) by mouth 2 (two) times daily. 30 capsule 0  . hydrochlorothiazide (HYDRODIURIL) 25 MG tablet TAKE 1 TABLET (25 MG TOTAL) BY MOUTH  DAILY. 90 tablet 3  . lisinopril (PRINIVIL,ZESTRIL) 40 MG tablet TAKE 1 TABLET (40 MG TOTAL) BY MOUTH DAILY. 90 tablet 1  . PHENobarbital (LUMINAL) 64.8 MG tablet Take 1.5 tablets (97.2 mg total) by mouth at bedtime. 60 tablet 2  . polyethylene glycol powder (GLYCOLAX/MIRALAX) powder Take 17 g by mouth 2 (two) times daily as needed. 3350 g 1  . Potassium Chloride ER 20 MEQ TBCR TAKE 1 TABLETS BY MOUTH DAILY TO TWICE DAILY AS DIRECTED. 180 tablet 0  . pravastatin (PRAVACHOL) 40 MG tablet TAKE 1 TABLET (40 MG TOTAL) BY MOUTH DAILY. 90 tablet 0   No current facility-administered medications on file prior to visit.     Allergies:   Allergies  Allergen Reactions  . 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  Marland Kitchen Penicillins     Physical Exam General: well developed, well nourished middle-aged African-American male, seated, in no evident distress Head: head normocephalic and atraumatic.   Neck: supple with no carotid or supraclavicular bruits Cardiovascular: regular rate and rhythm, no murmurs Musculoskeletal: no deformity Skin:  no rash/petichiae Vascular:  Normal pulses all extremities  Neurologic Exam Mental Status: Awake and fully alert. Oriented to place and time. Recent and remote memory intact. Attention span, concentration and fund of knowledge appropriate. Mood and affect appropriate.  Cranial Nerves: Fundoscopic exam reveals sharp disc margins. Pupils equal, briskly reactive to light. Extraocular movements full without nystagmus. Visual fields full to confrontation. Hearing intact. Facial sensation intact. Face, tongue, palate moves normally and symmetrically.  Motor: Normal bulk and tone. Normal strength in all tested extremity muscles. Sensory.: intact to touch , pinprick , position and vibratory sensation.  Coordination: Rapid alternating movements normal in all extremities. Finger-to-nose and heel-to-shin performed accurately bilaterally. Gait  and Station: Arises from chair without difficulty. Stance is normal. Gait demonstrates normal stride length and balance . Able to heel, toe and tandem walk without difficulty.  Reflexes: 1+ and symmetric. Toes downgoing.       ASSESSMENT: 20 year African-American male with lifelong history of possible seizures with onset in third grade but patient's description is not quite classical. He has been on phenobarbital lifelong.     PLAN: I had a long discussion with the patient with regards to his remote history of seizures and reviewed previous neurological office visit notes from Dr. Jannifer Franklin which had suggested breakthrough seizure upon attempt to discontinue phenobarbital hence I recommend he stay on the current dose of phenobarb lifelong. He was asked to return for follow-up once he is to get prescription refills or call earlier if necessary Antony Contras, MD  [facility] [address] [place], [place] [address]  Phone [phone] Fax [phone] Note: This document was prepared with digital dictation and possible smart phrase technology. Any transcriptional errors that result from this process are unintentional.

## 2016-08-25 NOTE — Patient Instructions (Signed)
I had a long discussion with the patient with regards to his remote history of seizures and reviewed previous neurological office visit notes from Dr. Jannifer Franklin which had suggested breakthrough seizure upon attempt to discontinue phenobarbital hence I recommend he stay on the current dose of phenobarb lifelong. He was asked to return for follow-up once he is to get prescription refills or call earlier if necessary

## 2016-09-14 ENCOUNTER — Ambulatory Visit (INDEPENDENT_AMBULATORY_CARE_PROVIDER_SITE_OTHER): Payer: Managed Care, Other (non HMO) | Admitting: Family Medicine

## 2016-09-14 VITALS — BP 122/72 | HR 77 | Temp 98.4°F | Resp 17 | Ht 68.5 in | Wt 225.0 lb

## 2016-09-14 DIAGNOSIS — J069 Acute upper respiratory infection, unspecified: Secondary | ICD-10-CM | POA: Diagnosis not present

## 2016-09-14 DIAGNOSIS — R040 Epistaxis: Secondary | ICD-10-CM

## 2016-09-14 DIAGNOSIS — I10 Essential (primary) hypertension: Secondary | ICD-10-CM

## 2016-09-14 MED ORDER — AZELASTINE HCL 0.1 % NA SOLN: 1.0000 | Freq: Two times a day (BID) | NASAL | 12 refills | Status: DC

## 2016-09-14 NOTE — Progress Notes (Signed)
Chief Complaint  Patient presents with  . Sinusitis    HPI  Upper Respiratory Infection: Patient complains of symptoms of a URI. Symptoms include congestion. Onset of symptoms was 1 week ago, gradually improving since that time. He also c/o blood discharge from the nose when he blew his nose once yesterday. for the past 1 day .  He is drinking plenty of fluids. Evaluation to date: none. Treatment to date: decongestants. He denies wheezing, fevers or chills, rash, nausea or vomiting. He tried sudafed and reports that his symptoms are substantially better.   Hypertension Denies headaches or chest pain He took plain sudafed. It was not sudafed D He is compliant with his bp meds and avoiding salty foods No medication side effects  Past Medical History:  Diagnosis Date  . Allergy   . Arthritis   . Hyperlipidemia   . Hypertension   . Seizures ([place])     Current Outpatient Prescriptions  Medication Sig Dispense Refill  . allopurinol (ZYLOPRIM) 300 MG tablet TAKE 1 TABLET (300 MG TOTAL) BY MOUTH DAILY. 30 tablet 1  . docusate sodium (COLACE) 100 MG capsule Take 1 capsule (100 mg total) by mouth 2 (two) times daily. 30 capsule 0  . hydrochlorothiazide (HYDRODIURIL) 25 MG tablet TAKE 1 TABLET (25 MG TOTAL) BY MOUTH DAILY. 90 tablet 3  . lisinopril (PRINIVIL,ZESTRIL) 40 MG tablet TAKE 1 TABLET (40 MG TOTAL) BY MOUTH DAILY. 90 tablet 1  . PHENobarbital (LUMINAL) 64.8 MG tablet Take 1.5 tablets (97.2 mg total) by mouth at bedtime. 60 tablet 2  . polyethylene glycol powder (GLYCOLAX/MIRALAX) powder Take 17 g by mouth 2 (two) times daily as needed. 3350 g 1  . Potassium Chloride ER 20 MEQ TBCR TAKE 1 TABLETS BY MOUTH DAILY TO TWICE DAILY AS DIRECTED. 180 tablet 0  . pravastatin (PRAVACHOL) 40 MG tablet TAKE 1 TABLET (40 MG TOTAL) BY MOUTH DAILY. 90 tablet 0  . azelastine (ASTELIN) 0.1 % nasal spray Place 1 spray into both nostrils 2 (two) times daily. Use in each nostril for no more than 2 days  if there is a nosebleed. 30 mL 12   No current facility-administered medications for this visit.     Allergies:  Allergies  Allergen Reactions  . 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  Marland Kitchen Penicillins     Past Surgical History:  Procedure Laterality Date  . APPENDECTOMY    . TONSILLECTOMY      Social History   Social History  . Marital status: Married    Spouse name: N/A  . Number of children: N/A  . Years of education: N/A   Social History Main Topics  . Smoking status: Never Smoker  . Smokeless tobacco: Never Used  . Alcohol use No  . Drug use: No  . Sexual activity: Yes    Birth control/ protection: None   Other Topics Concern  . None   Social History Narrative   Married. Education: [employer].     ROS  Objective: Vitals:   09/14/16 1159  BP: 122/72  Pulse: 77  Resp: 17  Temp: 98.4 F (36.9 C)  TempSrc: Oral  SpO2: 97%  Weight: 225 lb (102.1 kg)  Height: 5' 8.5" (1.74 m)    Physical Exam General: alert, oriented, in NAD Head: normocephalic, atraumatic, no sinus tenderness Eyes: EOM intact, no scleral icterus or conjunctival injection Ears: TM clear bilaterally Throat: no pharyngeal exudate or erythema Lymph: no posterior auricular, submental  or cervical lymph adenopathy Heart: normal rate, normal sinus rhythm, no murmurs Lungs: clear to auscultation bilaterally, no wheezing   Assessment and Plan Ryota was seen today for sinusitis.  Diagnoses and all orders for this visit:  Essential hypertension- advised pt to avoid meds with "d" as these meds have dextromorphan which can increase blood pressure  Epistaxis- discussed that he should apply pressure with next bleed and if it does not improve can use astelin  -     azelastine (ASTELIN) 0.1 % nasal spray; Place 1 spray into both nostrils 2 (two) times daily. Use in each nostril for no more than 2 days if there is a nosebleed.  Acute URI- supportive care  advised       [place]

## 2016-09-14 NOTE — Patient Instructions (Addendum)
IF you received an x-ray today, you will receive an invoice from [facility]. Please contact [facility] at [phone] with questions or concerns regarding your invoice.   IF you received labwork today, you will receive an invoice from [employer]. Please contact [facility] at [phone] with questions or concerns regarding your invoice.   Our billing staff will not be able to assist you with questions regarding bills from these companies.  You will be contacted with the lab results as soon as they are available. The fastest way to get your results is to activate your My Chart account. Instructions are located on the last page of this paperwork. If you have not heard from [place] regarding the results in 2 weeks, please contact this office.      Nosebleed, Adult A nosebleed is when blood comes out of the nose. Nosebleeds are common. Usually, they are not a sign of a serious condition. Nosebleeds can happen if a small blood vessel in your nose starts to bleed or if the lining of your nose (mucous membrane) cracks. They are commonly caused by:  Allergies.  Colds.  Picking your nose.  Blowing your nose too hard.  An injury from sticking an object into your nose or getting hit in the nose.  Dry or cold air. Less common causes of nosebleeds include:  Toxic fumes.  Something abnormal in the nose or in the air-filled spaces in the bones of the face (sinuses).  Growths in the nose, such as polyps.  Medicines or conditions that cause blood to clot slowly.  Certain illnesses or procedures that irritate or dry out the nasal passages. Follow these instructions at home: When you have a nosebleed:  Sit down and tilt your head slightly forward.  Use a clean towel or tissue to pinch your nostrils under the bony part of your nose. After 10 minutes, let go of your nose and see if bleeding starts again. Do not release pressure before that time.  If there is still bleeding, repeat the pinching and holding for 10 minutes until the bleeding stops.  Do not place tissues or gauze in the nose to stop bleeding.  Avoid lying down and avoid tilting your head backward. That may make blood collect in the throat and cause gagging or coughing.  Use a nasal spray decongestant to help with a nosebleed as told by your health care provider.  Do not use petroleum jelly or mineral oil in your nose. It can drip into your lungs. After a nosebleed:  Avoid blowing your nose or sniffing for a number of hours.  Avoid straining, lifting, or bending at the waist for several days. You may resume other normal activities as you are able.  Use saline spray or a humidifier as told by your health care provider.  Aspirinand blood thinners make bleeding more likely. If you are prescribed these medicines and you suffer from nosebleeds:  Ask your health care provider if you should stop taking the medicines or if you should adjust the dose.  Do not stop taking medicines that your health care provider has recommended unless told by your health care provider.  If your nosebleed was caused by dry mucous membranes, use over-the-counter saline nasal spray or gel. This will keep the mucous membranes moist and allow them to heal. If you must use a lubricant:  Choose one that is water-soluble.  Use only as much as you need and use it only as often as  needed.  Do not lie down until several hours after you use it. Contact a health care provider if:  You have a fever.  You get nosebleeds often or more often than usual.  You bruise very easily.  You have a nosebleed from having something stuck in your nose.  You have bleeding in your mouth.  You vomit or cough up brown material.  You have a nosebleed after you start a new medicine. Get help right away if:  You have a nosebleed after a fall or a head injury.  Your nosebleed does not go away after 20  minutes.  You feel dizzy or weak.  You have unusual bleeding from other parts of your body.  You have unusual bruising on other parts of your body.  You become sweaty.  You vomit blood. This information is not intended to replace advice given to you by your health care provider. Make sure you discuss any questions you have with your health care provider. Document Released: 07/01/2005 Document Revised: 05/21/2016 Document Reviewed: 04/07/2016 Elsevier Interactive Patient Education  2017 [employer].

## 2016-09-21 ENCOUNTER — Encounter: Payer: Self-pay | Admitting: Gastroenterology

## 2016-09-21 ENCOUNTER — Other Ambulatory Visit: Payer: Self-pay | Admitting: Physician Assistant

## 2016-09-21 ENCOUNTER — Ambulatory Visit (INDEPENDENT_AMBULATORY_CARE_PROVIDER_SITE_OTHER): Payer: Managed Care, Other (non HMO) | Admitting: Gastroenterology

## 2016-09-21 ENCOUNTER — Other Ambulatory Visit (INDEPENDENT_AMBULATORY_CARE_PROVIDER_SITE_OTHER): Payer: Managed Care, Other (non HMO)

## 2016-09-21 VITALS — BP 162/84 | HR 72 | Ht 68.5 in | Wt 223.5 lb

## 2016-09-21 DIAGNOSIS — R1310 Dysphagia, unspecified: Secondary | ICD-10-CM

## 2016-09-21 DIAGNOSIS — Z800 Family history of malignant neoplasm of digestive organs: Secondary | ICD-10-CM | POA: Diagnosis not present

## 2016-09-21 DIAGNOSIS — M1A9XX0 Chronic gout, unspecified, without tophus (tophi): Secondary | ICD-10-CM

## 2016-09-21 DIAGNOSIS — D649 Anemia, unspecified: Secondary | ICD-10-CM

## 2016-09-21 LAB — CBC WITH DIFFERENTIAL/PLATELET
BASOS ABS: 0.1 10*3/uL (ref 0.0–0.1)
Basophils Relative: 0.9 % (ref 0.0–3.0)
EOS ABS: 0.6 10*3/uL (ref 0.0–0.7)
Eosinophils Relative: 9.5 % — ABNORMAL HIGH (ref 0.0–5.0)
HEMATOCRIT: 40.4 % (ref 39.0–52.0)
Hemoglobin: 13.4 g/dL (ref 13.0–17.0)
LYMPHS PCT: 23.9 % (ref 12.0–46.0)
Lymphs Abs: 1.5 10*3/uL (ref 0.7–4.0)
MCHC: 33 g/dL (ref 30.0–36.0)
MCV: 92.6 fl (ref 78.0–100.0)
MONOS PCT: 9.3 % (ref 3.0–12.0)
Monocytes Absolute: 0.6 10*3/uL (ref 0.1–1.0)
NEUTROS PCT: 56.4 % (ref 43.0–77.0)
Neutro Abs: 3.5 10*3/uL (ref 1.4–7.7)
Platelets: 203 10*3/uL (ref 150.0–400.0)
RBC: 4.36 Mil/uL (ref 4.22–5.81)
RDW: 13.9 % (ref 11.5–15.5)
WBC: 6.1 10*3/uL (ref 4.0–10.5)

## 2016-09-21 LAB — IBC PANEL
Iron: 68 ug/dL (ref 42–165)
Saturation Ratios: 21 % (ref 20.0–50.0)
TRANSFERRIN: 231 mg/dL (ref 212.0–360.0)

## 2016-09-21 LAB — FOLATE: Folate: 8.8 ng/mL (ref >5.9)

## 2016-09-21 LAB — FERRITIN: Ferritin: 95.5 ng/mL (ref 22.0–322.0)

## 2016-09-21 LAB — VITAMIN B12: Vitamin B-12: 248 pg/mL (ref 211–911)

## 2016-09-21 NOTE — Patient Instructions (Signed)
If you are age 65 or older, your body mass index should be between 23-30. Your Body mass index is 33.49 kg/m. If this is out of the aforementioned range listed, please consider follow up with your Primary Care Provider.  If you are age 4 or younger, your body mass index should be between 19-25. Your Body mass index is 33.49 kg/m. If this is out of the aformentioned range listed, please consider follow up with your Primary Care Provider.   Your physician has requested that you go to the basement for the following lab work before leaving today:  CBC, Ferritin, IBC Panel, B12  You have been scheduled for an endoscopy. Please follow written instructions given to you at your visit today. If you use inhalers (even only as needed), please bring them with you on the day of your procedure. Your physician has requested that you go to www.startemmi.com and enter the access code given to you at your visit today. This web site gives a general overview about your procedure. However, you should still follow specific instructions given to you by our office regarding your preparation for the procedure.  Thank you.

## 2016-09-21 NOTE — Progress Notes (Signed)
HPI :  65 y/o male with a history of epilepsy / seizure disorder, HLD, HTN, here for a new patient evaluation for dysphagia.   He reports dysphagia ongoing for 8 months. He endorses dysphagia in sternal notch.  Dysphagia to both solids and liquids. He is not sure if he is eating too fast, but occurs sporadically without a clear frequency. He reports he vomits up contents when it happens, as drinking fluids is not successful in pushing it down.  He reports  symptoms usually precipitated by eating meat and rice.   He denies any heartburn. No odynophagia. No prior upper endoscopy. No FH of esophageal cancer. He denies any weight loss.   He thinks he has had a prior colonoscopy although is not certain when. Father had colon cancer. He is not sure how old, he thinks diagnosed > age 11, likely later in life. He thinks colonoscopy occurred in [place], not sure which practice, perhaps < 10 years ago. No trouble with his bowels. No blood in the stools.   He can't remember the last time he has had a seizure, medications working well for him, he thinks last seizure > 10 years ago. Otherwise no complaints today.    Of note he has a mild normocytic anemia.  Past Medical History:  Diagnosis Date  . Allergy   . Arthritis   . Hyperlipidemia   . Hypertension   . Seizures ([place])      Past Surgical History:  Procedure Laterality Date  . APPENDECTOMY    . TONSILLECTOMY     Family History  Problem Relation Age of Onset  . Heart disease Mother     Had a CABG  . Colon cancer Father   . Seizures Daughter   . Stomach cancer Neg Hx   . Esophageal cancer Neg Hx   . Rectal cancer Neg Hx   . Liver cancer Neg Hx    Social History  Substance Use Topics  . Smoking status: Never Smoker  . Smokeless tobacco: Never Used  . Alcohol use No   Current Outpatient Prescriptions  Medication Sig Dispense Refill  . allopurinol (ZYLOPRIM) 300 MG tablet TAKE 1 TABLET (300 MG TOTAL) BY MOUTH DAILY. 30 tablet 1   . azelastine (ASTELIN) 0.1 % nasal spray Place 1 spray into both nostrils 2 (two) times daily. Use in each nostril for no more than 2 days if there is a nosebleed. 30 mL 12  . docusate sodium (COLACE) 100 MG capsule Take 1 capsule (100 mg total) by mouth 2 (two) times daily. 30 capsule 0  . hydrochlorothiazide (HYDRODIURIL) 25 MG tablet TAKE 1 TABLET (25 MG TOTAL) BY MOUTH DAILY. 90 tablet 3  . lisinopril (PRINIVIL,ZESTRIL) 40 MG tablet TAKE 1 TABLET (40 MG TOTAL) BY MOUTH DAILY. 90 tablet 1  . PHENobarbital (LUMINAL) 64.8 MG tablet Take 1.5 tablets (97.2 mg total) by mouth at bedtime. 60 tablet 2  . polyethylene glycol powder (GLYCOLAX/MIRALAX) powder Take 17 g by mouth 2 (two) times daily as needed. 3350 g 1  . Potassium Chloride ER 20 MEQ TBCR TAKE 1 TABLETS BY MOUTH DAILY TO TWICE DAILY AS DIRECTED. 180 tablet 0  . pravastatin (PRAVACHOL) 40 MG tablet TAKE 1 TABLET (40 MG TOTAL) BY MOUTH DAILY. 90 tablet 0   No current facility-administered medications for this visit.    Allergies  Allergen Reactions  . 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  Marland Kitchen Penicillins  Review of Systems: All systems reviewed and negative except where noted in HPI.   Lab Results  Component Value Date   WBC 5.6 03/19/2016   HGB 13.0 (L) 03/19/2016   HCT 39.0 03/19/2016   MCV 92.4 03/19/2016   PLT 205 03/19/2016    Lab Results  Component Value Date   CREATININE 1.53 (H) 05/13/2016   BUN 20 05/13/2016   NA 137 05/13/2016   K 3.8 05/13/2016   CL 103 05/13/2016   CO2 23 05/13/2016   Lab Results  Component Value Date   ALT 17 03/19/2016   AST 21 03/19/2016   ALKPHOS 108 03/19/2016   BILITOT 0.2 03/19/2016     Physical Exam: BP (!) 162/84   Pulse 72   Ht 5' 8.5" (1.74 m)   Wt 223 lb 8 oz (101.4 kg)   BMI 33.49 kg/m  Constitutional: Pleasant,well-developed, male in no acute distress. HEENT: Normocephalic and atraumatic. Conjunctivae are normal. No  scleral icterus. Neck supple.  Cardiovascular: Normal rate, regular rhythm.  Pulmonary/chest: Effort normal and breath sounds normal. No wheezing, rales or rhonchi. Abdominal: Soft, nondistended, nontender.  There are no masses palpable. . Extremities: no edema Lymphadenopathy: No cervical adenopathy noted. Neurological: Alert and oriented to person place and time. Skin: Skin is warm and dry. No rashes noted. Psychiatric: Normal mood and affect. Behavior is normal.   ASSESSMENT AND PLAN: 65 year old male here for new patient assessment of the following issues:  Dysphagia - to both solids and liquids occurring intermittently over the past 8 months. Denies heartburn/odynophagia. I discussed differential with him and recommend upper endoscopy with possible dilation to further evaluate and treat. I discussed risks and benefits of anesthesia and endoscopy with him, and he wished to proceed. I would avoid meat until this procedure is done to prevent impaction.   Anemia - mild anemia, hemoglobin in 13s for past 2 years. MCV normal. Check B12/folate levels as well as iron panel to ensure normal. If he has an iron deficiency will add on colonoscopy to the evaluation.   Family history of colon cancer in father - patient has had a prior colonoscopy of the details of this are unknown. We will reach out to another local practices to try to obtain this report to determine when his next due. If we can't find this he may wish to consider having colonoscopy performed the same time as upper endoscopy for his colon cancer screening.   [place] Cellar, MD [facility] Pager [phone]  CC: Alveda Reasons, MD

## 2016-09-22 ENCOUNTER — Telehealth: Payer: Self-pay

## 2016-09-22 NOTE — Telephone Encounter (Signed)
Pt informed of results. Egd is scheduled the first of Jan and record release for [facility] was signed at appt yesterday.

## 2016-09-22 NOTE — Telephone Encounter (Signed)
-----   Message from Manus Gunning, MD sent at 09/21/2016  5:02 PM EST ----- Caryl Pina can you please let this patient know his labs are stable - no anemia, iron, [id number], and folic acid levels normal. Mild eosinophilia noted. We will await his EGD and await records of colonoscopy. thanks

## 2016-09-23 ENCOUNTER — Other Ambulatory Visit: Payer: Self-pay | Admitting: Physician Assistant

## 2016-09-23 DIAGNOSIS — I10 Essential (primary) hypertension: Secondary | ICD-10-CM

## 2016-09-23 NOTE — Telephone Encounter (Signed)
09/2016 last ov 

## 2016-09-23 NOTE — Telephone Encounter (Signed)
Please let the patient know that his last creatinine was high. He will need to get a fresh set of labs in the [place] [facility] to monitor the allopurinol.  I sent in 30 day supply with one refill.

## 2016-09-25 NOTE — Telephone Encounter (Signed)
Spoke with pt and gave Nolon Rod' message

## 2016-09-28 ENCOUNTER — Other Ambulatory Visit: Payer: Self-pay | Admitting: Neurology

## 2016-09-28 DIAGNOSIS — G40909 Epilepsy, unspecified, not intractable, without status epilepticus: Secondary | ICD-10-CM

## 2016-10-01 NOTE — Telephone Encounter (Signed)
RX for luminal signed by Dr. Rexene Alberts and faxed to [facility]. Received a receipt of confirmation.

## 2016-10-05 ENCOUNTER — Other Ambulatory Visit: Payer: Self-pay | Admitting: Physician Assistant

## 2016-10-05 DIAGNOSIS — M1990 Unspecified osteoarthritis, unspecified site: Secondary | ICD-10-CM

## 2016-10-05 DIAGNOSIS — E785 Hyperlipidemia, unspecified: Secondary | ICD-10-CM

## 2016-10-05 HISTORY — DX: Unspecified osteoarthritis, unspecified site: M19.90

## 2016-10-06 ENCOUNTER — Ambulatory Visit (AMBULATORY_SURGERY_CENTER): Payer: Medicare Other | Admitting: Gastroenterology

## 2016-10-06 ENCOUNTER — Encounter: Payer: Self-pay | Admitting: Gastroenterology

## 2016-10-06 VITALS — BP 137/84 | HR 54 | Temp 98.9°F | Resp 11 | Ht 68.5 in | Wt 223.0 lb

## 2016-10-06 DIAGNOSIS — R1310 Dysphagia, unspecified: Secondary | ICD-10-CM | POA: Diagnosis not present

## 2016-10-06 DIAGNOSIS — K222 Esophageal obstruction: Secondary | ICD-10-CM | POA: Diagnosis not present

## 2016-10-06 MED ORDER — SODIUM CHLORIDE 0.9 % IV SOLN: 500.0000 mL | INTRAVENOUS | Status: DC

## 2016-10-06 NOTE — Progress Notes (Signed)
Called to room to assist during endoscopic procedure.  Patient ID and intended procedure confirmed with present staff. Received instructions for my participation in the procedure from the performing physician.  

## 2016-10-06 NOTE — Patient Instructions (Signed)
YOU HAD AN ENDOSCOPIC PROCEDURE TODAY AT [facility]:   Refer to the procedure report that was given to you for any specific questions about what was found during the examination.  If the procedure report does not answer your questions, please call your gastroenterologist to clarify.  If you requested that your care partner not be given the details of your procedure findings, then the procedure report has been included in a sealed envelope for you to review at your convenience later.  YOU SHOULD EXPECT: Some feelings of bloating in the abdomen. Passage of more gas than usual.  Walking can help get rid of the air that was put into your GI tract during the procedure and reduce the bloating. If you had a lower endoscopy (such as a colonoscopy or flexible sigmoidoscopy) you may notice spotting of blood in your stool or on the toilet paper. If you underwent a bowel prep for your procedure, you may not have a normal bowel movement for a few days.  Please Note:  You might notice some irritation and congestion in your nose or some drainage.  This is from the oxygen used during your procedure.  There is no need for concern and it should clear up in a day or so.  SYMPTOMS TO REPORT IMMEDIATELY:   Following lower endoscopy (colonoscopy or flexible sigmoidoscopy):  Excessive amounts of blood in the stool  Significant tenderness or worsening of abdominal pains  Swelling of the abdomen that is new, acute  Fever of 100F or higher   Following upper endoscopy (EGD)  Vomiting of blood or coffee ground material  New chest pain or pain under the shoulder blades  Painful or persistently difficult swallowing  New shortness of breath  Fever of 100F or higher  Black, tarry-looking stools  For urgent or emergent issues, a gastroenterologist can be reached at any hour by calling [phone].   DIET:  Follow Dilation Handout  ACTIVITY:  You should plan to take it easy for the rest of today  and you should NOT DRIVE or use heavy machinery until tomorrow (because of the sedation medicines used during the test).    FOLLOW UP: Our staff will call the number listed on your records the next business day following your procedure to check on you and address any questions or concerns that you may have regarding the information given to you following your procedure. If we do not reach you, we will leave a message.  However, if you are feeling well and you are not experiencing any problems, there is no need to return our call.  We will assume that you have returned to your regular daily activities without incident.  If any biopsies were taken you will be contacted by phone or by letter within the next 1-3 weeks.  Please call us at [phone] if you have not heard about the biopsies in 3 weeks.    SIGNATURES/CONFIDENTIALITY: You and/or your care partner have signed paperwork which will be entered into your electronic medical record.  These signatures attest to the fact that that the information above on your After Visit Summary has been reviewed and is understood.  Full responsibility of the confidentiality of this discharge information lies with you and/or your care-partner.   Resume medications. Information given on Hiatal Hernia and Dilation Diet.

## 2016-10-06 NOTE — Op Note (Signed)
[facility] Patient Name: Wesley Mccarthy Procedure Date: 10/06/2016 2:34 PM MRN: [id number] Endoscopist: Remo Lipps P. Kazuko Clemence MD, MD Age: 66 Referring MD:  Date of Birth: 1950/11/17 Gender: Male Account #: [id number] Procedure:                Upper GI endoscopy Indications:              Dysphagia Medicines:                Monitored Anesthesia Care Procedure:                Pre-Anesthesia Assessment:                           - Prior to the procedure, a History and Physical                            was performed, and patient medications and                            allergies were reviewed. The patient's tolerance of                            previous anesthesia was also reviewed. The risks                            and benefits of the procedure and the sedation                            options and risks were discussed with the patient.                            All questions were answered, and informed consent                            was obtained. Prior Anticoagulants: The patient has                            taken no previous anticoagulant or antiplatelet                            agents. ASA Grade Assessment: II - A patient with                            mild systemic disease. After reviewing the risks                            and benefits, the patient was deemed in                            satisfactory condition to undergo the procedure.                           After obtaining informed consent, the endoscope was  passed under direct vision. Throughout the                            procedure, the patient's blood pressure, pulse, and                            oxygen saturations were monitored continuously. The                            Model GIF-HQ190 [id number]) scope was introduced                            through the mouth, and advanced to the second part                            of duodenum. The upper GI endoscopy was                             accomplished without difficulty. The patient                            tolerated the procedure well. Scope In: Scope Out: Findings:                 Esophagogastric landmarks were identified: the                            Z-line was found at 37 cm, the gastroesophageal                            junction was found at 37 cm and the upper extent of                            the gastric folds was found at 40 cm from the                            incisors.                           A 3 cm hiatal hernia was present.                           One mild benign-appearing, intrinsic stenosis                            (Shatski ring) was found 37 cm from the incisors.                            This measured less than one cm (in length) and was                            traversed. A TTS dilator was passed through the  scope. Dilation with an 18-19-20 mm balloon dilator                            was performed to 18 mm and 19 mm, after which a                            mucosal wrent was appreciated.                           The exam of the esophagus was otherwise normal.                           The entire examined stomach was normal.                           The duodenal bulb and second portion of the                            duodenum were normal. Complications:            No immediate complications. Estimated blood loss:                            Minimal. Estimated Blood Loss:     Estimated blood loss was minimal. Impression:               - Esophagogastric landmarks identified.                           - 3 cm hiatal hernia.                           - Benign-appearing esophageal stenosis. Dilated to                            34mm with good result.                           - Normal stomach.                           - Normal duodenal bulb and second portion of the                            duodenum. Recommendation:           -  Patient has a contact number available for                            emergencies. The signs and symptoms of potential                            delayed complications were discussed with the                            patient. Return to normal activities tomorrow.  Written discharge instructions were provided to the                            patient.                           - Resume previous diet.                           - Continue present medications.                           - Monitor symptoms post dilation. If symptoms                            persist despite dilation please contact me for                            reassessment. If symptoms are improved, follow up                            as needed for retreatment Remo Lipps P. Sunita Demond MD, MD 10/06/2016 3:11:59 PM This report has been signed electronically.

## 2016-10-06 NOTE — Progress Notes (Signed)
  [facility] Post-op Note  Patient: Wesley Mccarthy  Procedure(s) Performed: endoscopy with dilatation  Patient Location: [facility] - Recovery Area  Anesthesia Type: Deep Sedation/Propofol  Level of Consciousness: awake, oriented and patient cooperative  Airway and Oxygen Therapy: Patient Spontanous Breathing  Post-op Pain: none  Post-op Assessment:  Post-op Vital signs reviewed, Patient's Cardiovascular Status Stable, Respiratory Function Stable, Patent Airway, No signs of Nausea or vomiting and Pain level controlled  Post-op Vital Signs: Reviewed and stable  Complications: No apparent anesthesia complications  Khi Mcmillen E 3:11 PM

## 2016-10-07 ENCOUNTER — Telehealth: Payer: Self-pay

## 2016-10-07 NOTE — Telephone Encounter (Signed)
  Follow up Call-  Call back number 10/06/2016  Post procedure Call Back phone  # [phone]  Permission to leave phone message Yes  Some recent data might be hidden     Patient questions:  Do you have a fever, pain , or abdominal swelling? No. Pain Score  0 *  Have you tolerated food without any problems? Yes.    Have you been able to return to your normal activities? Yes.    Do you have any questions about your discharge instructions: Diet   No. Medications  No. Follow up visit  No.  Do you have questions or concerns about your Care? No.  Actions: * If pain score is 4 or above: No action needed, pain <4.

## 2016-12-01 ENCOUNTER — Ambulatory Visit (INDEPENDENT_AMBULATORY_CARE_PROVIDER_SITE_OTHER): Payer: Medicare Other | Admitting: Physician Assistant

## 2016-12-01 VITALS — BP 128/68 | HR 53 | Temp 97.9°F | Resp 16 | Ht 68.58 in | Wt 225.8 lb

## 2016-12-01 DIAGNOSIS — K5900 Constipation, unspecified: Secondary | ICD-10-CM

## 2016-12-01 DIAGNOSIS — Z23 Encounter for immunization: Secondary | ICD-10-CM

## 2016-12-01 DIAGNOSIS — M25572 Pain in left ankle and joints of left foot: Secondary | ICD-10-CM

## 2016-12-01 MED ORDER — DOCUSATE SODIUM 100 MG PO CAPS: 100.0000 mg | ORAL_CAPSULE | Freq: Two times a day (BID) | ORAL | 3 refills | Status: DC

## 2016-12-01 MED ORDER — INDOMETHACIN 50 MG PO CAPS: 50.0000 mg | ORAL_CAPSULE | Freq: Three times a day (TID) | ORAL | 0 refills | Status: DC

## 2016-12-01 NOTE — Progress Notes (Signed)
Wesley Mccarthy  MRN: [id number] DOB: 1951/05/17  Subjective:  Pt presents to clinic with left ankle pain that has been intermittent for the last 3-4 days.  No injury that he knows of.  Hurts worse in the am but then throughout the day.  He has had to limp because of the pain.  He has tried nothing for the pain at home.  He has h/o gout this does not feel like that.    He has swelling on his feet/legs at time and he has compression socks at home but he does not want to wear them  Review of Systems  Musculoskeletal: Positive for gait problem (2nd to pain) and joint swelling (left ankle). Negative for back pain.    Patient Active Problem List   Diagnosis Date Noted  . Anemia 08/22/2013  . 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 Stasis dermatitis of both legs 12/21/2012  . Edema 12/21/2012  . Seizure disorder ([place]) 08/26/2012  . HTN (hypertension) 11/11/2011  . Hyperlipemia 11/11/2011  . Gout 11/11/2011  . Obesity 11/11/2011    Current Outpatient Prescriptions on File Prior to Visit  Medication Sig Dispense Refill  . allopurinol (ZYLOPRIM) 300 MG tablet TAKE ONE TABLET BY MOUTH DAILY 30 tablet 1  . azelastine (ASTELIN) 0.1 % nasal spray Place 1 spray into both nostrils 2 (two) times daily. Use in each nostril for no more than 2 days if there is a nosebleed. 30 mL 12  . hydrochlorothiazide (HYDRODIURIL) 25 MG tablet TAKE 1 TABLET (25 MG TOTAL) BY MOUTH DAILY. 90 tablet 3  . lisinopril (PRINIVIL,ZESTRIL) 40 MG tablet TAKE 1 TABLET (40 MG TOTAL) BY MOUTH DAILY. 90 tablet 1  . PHENobarbital (LUMINAL) 64.8 MG tablet TAKE 1.5 TABLETS BY MOUTH AT BEDTIME 60 tablet 1  . polyethylene glycol powder (GLYCOLAX/MIRALAX) powder Take 17 g by mouth 2 (two) times daily as needed. 3350 g 1  . Potassium Chloride ER 20 MEQ TBCR TAKE 1 TABLETS BY MOUTH DAILY TO TWICE DAILY AS DIRECTED. 180 tablet 0  . pravastatin (PRAVACHOL) 40 MG tablet TAKE 1 TABLET (40 MG TOTAL) BY MOUTH DAILY. 90 tablet 0   No current  facility-administered medications on file prior to visit.     Allergies  Allergen Reactions  . 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  Marland Kitchen Penicillins     Pt patients past, family and social history were reviewed and updated.   Objective:  BP 128/68 (BP Location: Right Arm, Patient Position: Sitting, Cuff Size: Small)   Pulse (!) 53   Temp 97.9 F (36.6 C) (Oral)   Resp 16   Ht 5' 8.58" (1.742 m)   Wt 225 lb 12.8 oz (102.4 kg)   SpO2 99%   BMI 33.75 kg/m   Physical Exam  Musculoskeletal:       Right lower leg: He exhibits swelling (1+ pitting edema - sock lines present).       Left lower leg: He exhibits swelling. He exhibits no tenderness, no bony tenderness and no edema.  No erythema or warmth on left ankle    Assessment and Plan :  Constipation, unspecified constipation type - Plan: docusate sodium (COLACE) 100 MG capsule, Care order/instruction: - pt uses daily and needs a refill  Acute left ankle pain - Plan: indomethacin (INDOCIN) 50 MG capsule  Flu vaccine need - Plan: Flu Vaccine QUAD 36+ mos IM   Unsure cause of patient's pain - ? Mild gout vs likely  strain from unknown injury - will start indomethacin as this will help him with future gout attacks --encouraged patient to use compression socks as that will help with his swelling that develops during the day.    Windell Hummingbird PA-C  [facility] 12/01/2016 9:34 AM

## 2016-12-01 NOTE — Patient Instructions (Signed)
You can ice and elevate that ankle.  I might suggest wearing those compression socks esp on the right foot as that will help with the swelling.  I gave you an antiinflammatory medication today that should help with the swelling and the pain that you have had in your ankle.

## 2016-12-06 ENCOUNTER — Other Ambulatory Visit: Payer: Self-pay | Admitting: Physician Assistant

## 2016-12-06 DIAGNOSIS — I10 Essential (primary) hypertension: Secondary | ICD-10-CM

## 2016-12-07 NOTE — Telephone Encounter (Signed)
Please schedule an appt with PCP of patient's choice within 3 months.

## 2016-12-29 ENCOUNTER — Other Ambulatory Visit: Payer: Self-pay | Admitting: Neurology

## 2016-12-29 DIAGNOSIS — G40909 Epilepsy, unspecified, not intractable, without status epilepticus: Secondary | ICD-10-CM

## 2017-02-06 ENCOUNTER — Other Ambulatory Visit: Payer: Self-pay | Admitting: Neurology

## 2017-02-06 DIAGNOSIS — G40909 Epilepsy, unspecified, not intractable, without status epilepticus: Secondary | ICD-10-CM

## 2017-02-09 ENCOUNTER — Other Ambulatory Visit: Payer: Self-pay

## 2017-02-09 DIAGNOSIS — G40909 Epilepsy, unspecified, not intractable, without status epilepticus: Secondary | ICD-10-CM

## 2017-02-09 MED ORDER — PHENOBARBITAL 64.8 MG PO TABS: ORAL_TABLET | ORAL | 5 refills | Status: DC

## 2017-02-09 NOTE — Telephone Encounter (Signed)
Rx for phenobarbital fax to [facility] at [phone].

## 2017-03-03 ENCOUNTER — Other Ambulatory Visit: Payer: Self-pay | Admitting: Physician Assistant

## 2017-03-03 DIAGNOSIS — I10 Essential (primary) hypertension: Secondary | ICD-10-CM

## 2017-06-11 ENCOUNTER — Encounter: Payer: Self-pay | Admitting: Urgent Care

## 2017-06-11 ENCOUNTER — Ambulatory Visit (INDEPENDENT_AMBULATORY_CARE_PROVIDER_SITE_OTHER): Payer: Medicare Other | Admitting: Urgent Care

## 2017-06-11 VITALS — BP 152/77 | HR 68 | Temp 98.0°F | Resp 17 | Ht 68.5 in | Wt 220.0 lb

## 2017-06-11 DIAGNOSIS — M25562 Pain in left knee: Secondary | ICD-10-CM | POA: Diagnosis not present

## 2017-06-11 DIAGNOSIS — M1712 Unilateral primary osteoarthritis, left knee: Secondary | ICD-10-CM | POA: Diagnosis not present

## 2017-06-11 DIAGNOSIS — R7989 Other specified abnormal findings of blood chemistry: Secondary | ICD-10-CM

## 2017-06-11 MED ORDER — METHYLPREDNISOLONE ACETATE 80 MG/ML IJ SUSP
80.0000 mg | Freq: Once | INTRAMUSCULAR | Status: AC
  Administered 2017-06-11: 80 mg via INTRAMUSCULAR

## 2017-06-11 NOTE — Progress Notes (Signed)
  MRN: [id number] DOB: Aug 29, 1951  Subjective:   Wesley Mccarthy is a 66 y.o. male presenting for chief complaint of Knee Pain (left side )  Reports 3 day history of left knee pain. Pain is achy in nature, lasts several hours, is elicited with increased activity, relieved with rest. Patient has used Bayer aspirin within minimal relief. X-rays from 2013, 2017 show stable OA of left knee. He has seen an orthopedist before but not for OA of his left knee. He has not had an episode of OA knee pain in his left knee for more than 1 year. Denies fever, redness, trauma, falls. Denies smoking cigarettes.  Wesley Mccarthy has a current medication list which includes the following prescription(s): allopurinol, azelastine, docusate sodium, hydrochlorothiazide, indomethacin, lisinopril, phenobarbital, polyethylene glycol powder, potassium chloride er, and pravastatin. Also is allergic to norvasc [amlodipine besylate] and penicillins.  Wesley Mccarthy  has a past medical history of Allergy; Arthritis; Hyperlipidemia; Hypertension; and Seizures ([place]). Also  has a past surgical history that includes Appendectomy and Tonsillectomy.  Objective:   Vitals: BP (!) 152/77   Pulse 68   Temp 98 F (36.7 C) (Oral)   Resp 17   Ht 5' 8.5" (1.74 m)   Wt 220 lb (99.8 kg)   SpO2 98%   BMI 32.96 kg/m   Physical Exam  Constitutional: He is oriented to person, place, and time. He appears well-developed and well-nourished.  Cardiovascular: Normal rate.   Pulmonary/Chest: Effort normal.  Musculoskeletal:       Left knee: He exhibits normal range of motion, no swelling, no effusion, no ecchymosis, no deformity, no erythema, normal alignment, normal patellar mobility and no bony tenderness. No tenderness found.  Neurological: He is alert and oriented to person, place, and time. He displays normal reflexes.   Assessment and Plan :   1. Acute pain of left knee 2. Osteoarthritis of left knee, unspecified osteoarthritis type - Patient requested  steroid injection. I advised that we should hold off on this and use OA knee brace but patient insists that is what his knee pain responds to. Counseled patient on potential for adverse effects with medications used today, patient verbalized understanding. Return-to-clinic precautions discussed, patient verbalized understanding. Consider referral to ortho if his knee pain persists. - methylPREDNISolone acetate (DEPO-MEDROL) injection 80 mg; Inject 1 mL (80 mg total) into the muscle once.  3. Elevated serum creatinine - Basic metabolic panel pending, will rx diclofenac if his kidney function has improved.   Jaynee Eagles, PA-C [facility] [phone] 06/11/2017  9:31 AM

## 2017-06-11 NOTE — Patient Instructions (Addendum)
Joint Pain Joint pain, which is also called arthralgia, can be caused by many things. Joint pain often goes away when you follow your health care provider's instructions for relieving pain at home. However, joint pain can also be caused by conditions that require further treatment. Common causes of joint pain include:  Bruising in the area of the joint.  Overuse of the joint.  Wear and tear on the joints that occur with aging (osteoarthritis).  Various other forms of arthritis.  A buildup of a crystal form of uric acid in the joint (gout).  Infections of the joint (septic arthritis) or of the bone (osteomyelitis).  Your health care provider may recommend medicine to help with the pain. If your joint pain continues, additional tests may be needed to diagnose your condition. Follow these instructions at home: Watch your condition for any changes. Follow these instructions as directed to lessen the pain that you are feeling.  Take medicines only as directed by your health care provider.  Rest the affected area for as long as your health care provider says that you should. If directed to do so, raise the painful joint above the level of your heart while you are sitting or lying down.  Do not do things that cause or worsen pain.  If directed, apply ice to the painful area: ? Put ice in a plastic bag. ? Place a towel between your skin and the bag. ? Leave the ice on for 20 minutes, 2-3 times per day.  Wear an elastic bandage, splint, or sling as directed by your health care provider. Loosen the elastic bandage or splint if your fingers or toes become numb and tingle, or if they turn cold and blue.  Begin exercising or stretching the affected area as directed by your health care provider. Ask your health care provider what types of exercise are safe for you.  Keep all follow-up visits as directed by your health care provider. This is important.  Contact a health care provider if:  Your  pain increases, and medicine does not help.  Your joint pain does not improve within 3 days.  You have increased bruising or swelling.  You have a fever.  You lose 10 lb (4.5 kg) or more without trying. Get help right away if:  You are not able to move the joint.  Your fingers or toes become numb or they turn cold and blue. This information is not intended to replace advice given to you by your health care provider. Make sure you discuss any questions you have with your health care provider. Document Released: 09/21/2005 Document Revised: 02/21/2016 Document Reviewed: 07/03/2014 Elsevier Interactive Patient Education  2018 [employer].     IF you received an x-ray today, you will receive an invoice from [facility]. Please contact [facility] at [phone] with questions or concerns regarding your invoice.   IF you received labwork today, you will receive an invoice from [facility]. Please contact [facility] at [phone] with questions or concerns regarding your invoice.   Our billing staff will not be able to assist you with questions regarding bills from these companies.  You will be contacted with the lab results as soon as they are available. The fastest way to get your results is to activate your My Chart account. Instructions are located on the last page of this paperwork. If you have not heard from [place] regarding the results in 2 weeks, please contact this office.

## 2017-06-12 LAB — BASIC METABOLIC PANEL
BUN / CREAT RATIO: 13 (ref 10–24)
BUN: 21 mg/dL (ref 8–27)
CO2: 25 mmol/L (ref 20–29)
CREATININE: 1.6 mg/dL — AB (ref 0.76–1.27)
Calcium: 9.6 mg/dL (ref 8.6–10.2)
Chloride: 100 mmol/L (ref 96–106)
GFR calc Af Amer: 51 mL/min/{1.73_m2} — ABNORMAL LOW (ref >59)
GFR, EST NON AFRICAN AMERICAN: 44 mL/min/{1.73_m2} — AB (ref >59)
Glucose: 98 mg/dL (ref 65–99)
Potassium: 3.6 mmol/L (ref 3.5–5.2)
SODIUM: 142 mmol/L (ref 134–144)

## 2017-06-24 ENCOUNTER — Encounter: Payer: Self-pay | Admitting: Urgent Care

## 2017-07-28 ENCOUNTER — Other Ambulatory Visit: Payer: Self-pay | Admitting: Family Medicine

## 2017-08-25 ENCOUNTER — Ambulatory Visit: Payer: Managed Care, Other (non HMO) | Admitting: Neurology

## 2017-09-17 ENCOUNTER — Other Ambulatory Visit: Payer: Self-pay

## 2017-09-17 ENCOUNTER — Encounter: Payer: Self-pay | Admitting: Family Medicine

## 2017-09-17 ENCOUNTER — Ambulatory Visit (INDEPENDENT_AMBULATORY_CARE_PROVIDER_SITE_OTHER): Payer: Medicare Other | Admitting: Family Medicine

## 2017-09-17 VITALS — BP 122/62 | HR 75 | Temp 98.1°F | Resp 16 | Ht 68.5 in | Wt 218.0 lb

## 2017-09-17 DIAGNOSIS — M25562 Pain in left knee: Secondary | ICD-10-CM

## 2017-09-17 DIAGNOSIS — M1712 Unilateral primary osteoarthritis, left knee: Secondary | ICD-10-CM | POA: Diagnosis not present

## 2017-09-17 MED ORDER — DICLOFENAC SODIUM 75 MG PO TBEC: DELAYED_RELEASE_TABLET | ORAL | 0 refills | Status: DC

## 2017-09-17 MED ORDER — METHYLPREDNISOLONE ACETATE 80 MG/ML IJ SUSP
80.0000 mg | Freq: Once | INTRAMUSCULAR | Status: AC
  Administered 2017-09-17: 80 mg via INTRA_ARTICULAR

## 2017-09-17 NOTE — Progress Notes (Signed)
Patient ID: Wesley Mccarthy, male    DOB: 02-Feb-1951  Age: 66 y.o. MRN: [id number]  Chief Complaint  Patient presents with  . Knee Pain    left/ x 3 wks. pt has hx of pain in this knee    Subjective:   Patient is here with his left knee hurting him more lately.  The last few weeks it has been bothering him.  No major trauma.  He works as a Pharmacist, community, on his feet a lot.  It swells up some at times.  He has had episodes of flares of this knee several times over the years, and arthritis appears on his medial aspect of the knee on prior x-rays.  He did well after the last cortisone injection 3 months ago.  He has seen an orthopedist for this in the past, and was told that sometime he may need a joint replacement.  Current allergies, medications, problem list, past/family and social histories reviewed.  Objective:  BP 122/62   Pulse 75   Temp 98.1 F (36.7 C) (Oral)   Resp 16   Ht 5' 8.5" (1.74 m)   Wt 218 lb (98.9 kg)   SpO2 97%   BMI 32.66 kg/m   No major acute distress.  No palpable effusion today.  There is some crepitance.  He is tender on the medial aspect of the left knee primarily.  Procedure note: Using sterile technique the knee was prepped with iodine and alcohol.  Ethyl chloride spray was used, with a local injection of 1% lidocaine.  Depo-Medrol 80 was injected with 1 cc of 1% lidocaine +1 cc of Marcaine.  The patient tolerated the procedure well.  There was almost no pain.  He was instructed in its care.  Assessment & Plan:   Assessment: 1. Primary osteoarthritis of left knee   2. Left knee pain, unspecified chronicity       Plan: See instructions  No orders of the defined types were placed in this encounter.   No orders of the defined types were placed in this encounter.        Patient Instructions   Try to rest your knee when possible the next couple of days.  Apply an ice pack to the knee 3-4 times daily for 2-3 days.  You have been  given a prescription for diclofenac 75 mg.  If the knee keeps bothering you, take this 1 twice daily with food.  Discontinue the medication if it causes stomach upset.  Eventually this knee will probably need orthopedic surgery with knee replacement.  My advice would be that you keep up with it periodically treating the acute flareups until he get too frequent or too bad or not responding to treatment, and at that point go ahead and get the surgery done.  Return as needed    IF you received an x-ray today, you will receive an invoice from [facility]. Please contact [facility] at [phone] with questions or concerns regarding your invoice.   IF you received labwork today, you will receive an invoice from [facility]. Please contact [facility] at [phone] with questions or concerns regarding your invoice.   Our billing staff will not be able to assist you with questions regarding bills from these companies.  You will be contacted with the lab results as soon as they are available. The fastest way to get your results is to activate your My Chart account. Instructions are located on the last page of this paperwork.  If you have not heard from [place] regarding the results in 2 weeks, please contact this office.         No Follow-up on file.   HOPPER,DAVID, MD 09/17/2017

## 2017-09-17 NOTE — Patient Instructions (Addendum)
Try to rest your knee when possible the next couple of days.  Apply an ice pack to the knee 3-4 times daily for 2-3 days.  You have been given a prescription for diclofenac 75 mg.  If the knee keeps bothering you, take this 1 twice daily with food.  Discontinue the medication if it causes stomach upset.  Eventually this knee will probably need orthopedic surgery with knee replacement.  My advice would be that you keep up with it periodically treating the acute flareups until he get too frequent or too bad or not responding to treatment, and at that point go ahead and get the surgery done.  Return as needed    IF you received an x-ray today, you will receive an invoice from [facility]. Please contact [facility] at [phone] with questions or concerns regarding your invoice.   IF you received labwork today, you will receive an invoice from [facility]. Please contact [facility] at [phone] with questions or concerns regarding your invoice.   Our billing staff will not be able to assist you with questions regarding bills from these companies.  You will be contacted with the lab results as soon as they are available. The fastest way to get your results is to activate your My Chart account. Instructions are located on the last page of this paperwork. If you have not heard from [place] regarding the results in 2 weeks, please contact this office.

## 2017-09-26 ENCOUNTER — Other Ambulatory Visit: Payer: Self-pay | Admitting: Neurology

## 2017-09-26 DIAGNOSIS — G40909 Epilepsy, unspecified, not intractable, without status epilepticus: Secondary | ICD-10-CM

## 2017-10-01 ENCOUNTER — Other Ambulatory Visit: Payer: Self-pay | Admitting: Neurology

## 2017-10-01 DIAGNOSIS — G40909 Epilepsy, unspecified, not intractable, without status epilepticus: Secondary | ICD-10-CM

## 2017-10-01 NOTE — Telephone Encounter (Signed)
Ok to refill. Setup follow up appt with Dr. Leonie Man or NP. -VRP

## 2017-10-01 NOTE — Telephone Encounter (Signed)
Patient has not seen Dr. Leonie Man since 08/25/16, do we continue to refill his phenobarbital?

## 2017-10-01 NOTE — Telephone Encounter (Signed)
Phenobarbital refill Rx successfully faxed to [facility], [place]

## 2017-10-07 ENCOUNTER — Telehealth: Payer: Self-pay

## 2017-10-07 NOTE — Telephone Encounter (Signed)
Pt was sent a letter about scheduling appt.

## 2017-10-07 NOTE — Telephone Encounter (Signed)
If patient calls back read note he need to schedule appt with NP or Dr. Leonie Man before end of March to continue getting refills. If patient does not schedule appt he will have to seek PCP for future refills. Rn tried to schedule appt on 10/07/2017 but pt stated he will call back.

## 2017-10-07 NOTE — Telephone Encounter (Signed)
Rn call patient that he was last seen 08/2016 by our office. Rn stated he requested a refill 10/01/2017, and the work in md gave him 3 months of phenobarbital. Pt stated another office gave the refills. Rn stated our office gave the refills. Pt stated "okay you are right". Rn stated to continue refills he needs a follow up in the next two months. RN stated he has to be seen to get refills. Pt stated okay I will call you back, and check my schedule. Rn reminded patient that if he does not schedule appt with our office within the next 3 month he should seek his PCP about refills. Rn stress he was last seen 08/2016,and office visit is required. Rn tried to schedule an appt with the NP or Dr.SEthi but pt stated he will call back.

## 2017-11-22 ENCOUNTER — Other Ambulatory Visit: Payer: Self-pay

## 2017-11-22 ENCOUNTER — Ambulatory Visit: Payer: Medicare Other | Admitting: Family Medicine

## 2017-11-22 ENCOUNTER — Encounter: Payer: Self-pay | Admitting: Family Medicine

## 2017-11-22 VITALS — BP 136/80 | HR 74 | Temp 98.2°F | Resp 17 | Ht 68.5 in | Wt 212.4 lb

## 2017-11-22 DIAGNOSIS — J069 Acute upper respiratory infection, unspecified: Secondary | ICD-10-CM

## 2017-11-22 DIAGNOSIS — R059 Cough, unspecified: Secondary | ICD-10-CM

## 2017-11-22 DIAGNOSIS — R05 Cough: Secondary | ICD-10-CM | POA: Diagnosis not present

## 2017-11-22 MED ORDER — POLYETHYLENE GLYCOL 3350 17 GM/SCOOP PO POWD: 17.0000 g | Freq: Two times a day (BID) | ORAL | 1 refills | Status: AC | PRN

## 2017-11-22 MED ORDER — FLUTICASONE PROPIONATE 50 MCG/ACT NA SUSP
2.0000 | Freq: Every day | NASAL | 6 refills | Status: DC
Start: 2017-11-22 — End: 2021-09-03

## 2017-11-22 NOTE — Progress Notes (Signed)
Chief Complaint  Patient presents with  . Cough    x 2 weeks, tried otc cough meds with no relief, lots of head congestion also, cough not keeping pt up at night. Pt has had flu vaccine for this season    HPI    otc meds helped the cough but not the congestion Sick contacts - wife and granddaughter Denies fevers or chills Nonsmoker No asthma  No shortness of breath +head congestion Green nasal discharge  No tinnitus No dizziness No nausea or vomiting    Past Medical History:  Diagnosis Date  . Allergy   . Arthritis   . Hyperlipidemia   . Hypertension   . Seizures ([place])     Current Outpatient Medications  Medication Sig Dispense Refill  . allopurinol (ZYLOPRIM) 300 MG tablet TAKE ONE TABLET BY MOUTH DAILY 30 tablet 1  . diclofenac (VOLTAREN) 75 MG EC tablet 1 twice daily if needed for knee pain.  Take with food 30 tablet 0  . docusate sodium (COLACE) 100 MG capsule Take 1 capsule (100 mg total) by mouth 2 (two) times daily. 180 capsule 3  . hydrochlorothiazide (HYDRODIURIL) 25 MG tablet TAKE 1 TABLET (25 MG TOTAL) BY MOUTH DAILY. 90 tablet 3  . lisinopril (PRINIVIL,ZESTRIL) 40 MG tablet TAKE ONE TABLET BY MOUTH DAILY 90 tablet 0  . PHENobarbital (LUMINAL) 64.8 MG tablet TAKE ONE AND ONE-HALF (1 & 1/2) TABLET BY MOUTH AT BEDTIME 60 tablet 3  . Potassium Chloride ER 20 MEQ TBCR TAKE 1 TABLETS BY MOUTH DAILY TO TWICE DAILY AS DIRECTED. 180 tablet 0  . pravastatin (PRAVACHOL) 40 MG tablet TAKE 1 TABLET (40 MG TOTAL) BY MOUTH DAILY. 90 tablet 0  . azelastine (ASTELIN) 0.1 % nasal spray Place 1 spray into both nostrils 2 (two) times daily. Use in each nostril for no more than 2 days if there is a nosebleed. (Patient not taking: Reported on 11/22/2017) 30 mL 12  . fluticasone (FLONASE) 50 MCG/ACT nasal spray Place 2 sprays into both nostrils daily. 16 g 6  . indomethacin (INDOCIN) 50 MG capsule Take 1 capsule (50 mg total) by mouth 3 (three) times daily with meals. (Patient not  taking: Reported on 11/22/2017) 60 capsule 0  . polyethylene glycol powder (GLYCOLAX/MIRALAX) powder Take 17 g by mouth 2 (two) times daily as needed. 3350 g 1   No current facility-administered medications for this visit.     Allergies:  Allergies  Allergen Reactions  . 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  Marland Kitchen Penicillins     Past Surgical History:  Procedure Laterality Date  . APPENDECTOMY    . TONSILLECTOMY      Social History   Socioeconomic History  . Marital status: Married    Spouse name: None  . Number of children: 3  . Years of education: None  . Highest education level: None  Social Needs  . Financial resource strain: None  . Food insecurity - worry: None  . Food insecurity - inability: None  . Transportation needs - medical: None  . Transportation needs - non-medical: None  Occupational History  . Occupation: [employer]  Tobacco Use  . Smoking status: Never Smoker  . Smokeless tobacco: Never Used  Substance and Sexual Activity  . Alcohol use: No  . Drug use: No  . Sexual activity: Yes    Birth control/protection: None  Other Topics Concern  . None  Social History Narrative   Married.  Education: [employer].     Family History  Problem Relation Age of Onset  . Heart disease Mother        Had a CABG  . Colon cancer Father   . Seizures Daughter   . Stomach cancer Neg Hx   . Esophageal cancer Neg Hx   . Rectal cancer Neg Hx   . Liver cancer Neg Hx      ROS Review of Systems See HPI Constitution: No fevers or chills No malaise No diaphoresis Skin: No rash or itching Eyes: no blurry vision, no double vision GU: no dysuria or hematuria Neuro: no dizziness or headaches all others reviewed and negative   Objective: Vitals:   11/22/17 1223  BP: 136/80  Pulse: 74  Resp: 17  Temp: 98.2 F (36.8 C)  TempSrc: Oral  SpO2: 97%  Weight: 212 lb 6.4 oz (96.3 kg)  Height: 5' 8.5" (1.74 m)    Physical  Exam General: alert, oriented, in NAD Head: normocephalic, atraumatic, no sinus tenderness Eyes: EOM intact, no scleral icterus or conjunctival injection Ears: TM clear bilaterally Nose: mucosa nonerythematous, nonedematous Throat: no pharyngeal exudate or erythema Lymph: no posterior auricular, submental or cervical lymph adenopathy Heart: normal rate, normal sinus rhythm, no murmurs Lungs: clear to auscultation bilaterally, no wheezing   Assessment and Plan Edder was seen today for cough.  Diagnoses and all orders for this visit:  Acute URI -     fluticasone (FLONASE) 50 MCG/ACT nasal spray; Place 2 sprays into both nostrils daily.  Cough  continue supportive care Normal cardiopulm exam  Other orders -     Cancel: Ambulatory referral to Gastroenterology -     polyethylene glycol powder (GLYCOLAX/MIRALAX) powder; Take 17 g by mouth 2 (two) times daily as needed.     [place]

## 2017-11-22 NOTE — Patient Instructions (Addendum)
     IF you received an x-ray today, you will receive an invoice from [facility]. Please contact [facility] at [phone] with questions or concerns regarding your invoice.   IF you received labwork today, you will receive an invoice from [facility]. Please contact [facility] at [phone] with questions or concerns regarding your invoice.   Our billing staff will not be able to assist you with questions regarding bills from these companies.  You will be contacted with the lab results as soon as they are available. The fastest way to get your results is to activate your My Chart account. Instructions are located on the last page of this paperwork. If you have not heard from us regarding the results in 2 weeks, please contact this office.     Cough, Adult Coughing is a reflex that clears your throat and your airways. Coughing helps to heal and protect your lungs. It is normal to cough occasionally, but a cough that happens with other symptoms or lasts a long time may be a sign of a condition that needs treatment. A cough may last only 2-3 weeks (acute), or it may last longer than 8 weeks (chronic). What are the causes? Coughing is commonly caused by:  Breathing in substances that irritate your lungs.  A viral or bacterial respiratory infection.  Allergies.  Asthma.  Postnasal drip.  Smoking.  Acid backing up from the stomach into the esophagus (gastroesophageal reflux).  Certain medicines.  Chronic lung problems, including COPD (or rarely, lung cancer).  Other medical conditions such as heart failure.  Follow these instructions at home: Pay attention to any changes in your symptoms. Take these actions to help with your discomfort:  Take medicines only as told by your health care provider. ? If you were prescribed an antibiotic medicine, take it as told by your health care provider. Do not stop taking the antibiotic even if you start to feel better. ? Talk  with your health care provider before you take a cough suppressant medicine.  Drink enough fluid to keep your urine clear or pale yellow.  If the air is dry, use a cold steam vaporizer or humidifier in your bedroom or your home to help loosen secretions.  Avoid anything that causes you to cough at work or at home.  If your cough is worse at night, try sleeping in a semi-upright position.  Avoid cigarette smoke. If you smoke, quit smoking. If you need help quitting, ask your health care provider.  Avoid caffeine.  Avoid alcohol.  Rest as needed.  Contact a health care provider if:  You have new symptoms.  You cough up pus.  Your cough does not get better after 2-3 weeks, or your cough gets worse.  You cannot control your cough with suppressant medicines and you are losing sleep.  You develop pain that is getting worse or pain that is not controlled with pain medicines.  You have a fever.  You have unexplained weight loss.  You have night sweats. Get help right away if:  You cough up blood.  You have difficulty breathing.  Your heartbeat is very fast. This information is not intended to replace advice given to you by your health care provider. Make sure you discuss any questions you have with your health care provider. Document Released: 03/20/2011 Document Revised: 02/27/2016 Document Reviewed: 11/28/2014 Elsevier Interactive Patient Education  2018 Elsevier Inc.  

## 2020-01-16 ENCOUNTER — Other Ambulatory Visit (HOSPITAL_COMMUNITY): Payer: Self-pay | Admitting: Pulmonary Disease

## 2020-01-16 DIAGNOSIS — R1310 Dysphagia, unspecified: Secondary | ICD-10-CM

## 2020-01-18 ENCOUNTER — Ambulatory Visit (HOSPITAL_COMMUNITY): Payer: Medicare HMO

## 2020-01-22 ENCOUNTER — Ambulatory Visit (HOSPITAL_COMMUNITY)
Admission: RE | Admit: 2020-01-22 | Discharge: 2020-01-22 | Disposition: A | Discharge status: Home / Self Care | Payer: Managed Care, Other (non HMO) | Source: Ambulatory Visit | Attending: Pulmonary Disease | Admitting: Pulmonary Disease

## 2020-01-22 ENCOUNTER — Other Ambulatory Visit: Payer: Self-pay

## 2020-01-22 DIAGNOSIS — R1310 Dysphagia, unspecified: Secondary | ICD-10-CM | POA: Insufficient documentation

## 2020-02-06 ENCOUNTER — Encounter: Payer: Self-pay | Admitting: Gastroenterology

## 2020-02-14 ENCOUNTER — Encounter: Payer: Self-pay | Admitting: Gastroenterology

## 2020-03-13 ENCOUNTER — Encounter: Payer: Self-pay | Admitting: Gastroenterology

## 2020-03-13 ENCOUNTER — Ambulatory Visit (INDEPENDENT_AMBULATORY_CARE_PROVIDER_SITE_OTHER): Payer: Managed Care, Other (non HMO) | Admitting: Gastroenterology

## 2020-03-13 VITALS — BP 112/80 | HR 84 | Ht 67.0 in | Wt 218.2 lb

## 2020-03-13 DIAGNOSIS — K222 Esophageal obstruction: Secondary | ICD-10-CM

## 2020-03-13 DIAGNOSIS — Z800 Family history of malignant neoplasm of digestive organs: Secondary | ICD-10-CM

## 2020-03-13 DIAGNOSIS — R1310 Dysphagia, unspecified: Secondary | ICD-10-CM

## 2020-03-13 MED ORDER — NA SULFATE-K SULFATE-MG SULF 17.5-3.13-1.6 GM/177ML PO SOLN: 1.0000 | Freq: Once | ORAL | 0 refills | Status: AC

## 2020-03-13 NOTE — Patient Instructions (Addendum)
You have been scheduled for an endoscopy and colonoscopy. Please follow the written instructions given to you at your visit today. Please pick up your prep supplies at the pharmacy within the next 1-3 days. If you use inhalers (even only as needed), please bring them with you on the day of your procedure.  You have been scheduled for an ultrasound of the neck at [facility] (1st floor of hospital) on 03/20/20 at 1:30pm. Please arrive 15 minutes prior to your appointment for registration. Should you need to reschedule your appointment, please contact radiology at [phone]. This test typically takes about 30 minutes to perform.

## 2020-03-13 NOTE — Progress Notes (Signed)
HPI :  69 y/o male with a history of epilepsy / seizure disorder, dysphagia, HTN, here to re-establish care for new patient evaluation for dysphagia.  I last saw him in January 2018.  At that point time he was having ongoing dysphagia to both solids and liquids.  We performed an upper endoscopy October 06, 2016, he was found to have a benign-appearing Schatzki ring at the [place] which was dilated 90 mm with a good result.  He states the dilation resolves his symptoms at the time and he had been doing pretty well.  He states his dysphagia has recurred in recent months.  He is having occasional dysphagia to solids if he eats quickly.  No problems with liquids or pills.  Denies any reflux symptoms.  No abdominal pains.  No nausea or vomiting.  He has not had a seizure in a very long time and states he is are well controlled.  Of note he had a barium swallow performed in April for this issue which confirmed Schatzki ring at the [place] [facility] as a likely cause for his dysphagia.  Incidentally he also had some extrinsic compression of the thoracic inlet possibly due to a thyroid goiter, he is not had any follow-up imaging for that.   Father had colon cancer. He is not sure how old, he thinks diagnosed > age 61, likely later in life.  He thinks he had a prior colonoscopy but states it was several years ago.  We attempted to find reports of his prior colonoscopies in the area at his last visit but could not find any evidence that he had one locally.  We discussed when he wanted to have another colonoscopy given we do not have any these records.  He denies any problems with his bowels.   EGD 10/06/2016 -  - A 3 cm hiatal hernia was present. - One mild benign-appearing, intrinsic stenosis (Shatski ring) was found 37 cm from the incisors. This measured less than one cm (in length) and was traversed. A TTS dilator was passed through the scope. Dilation with an 18-19-20 mm balloon dilator was performed to 18 mm and 19 mm,  after which a mucosal wrent was appreciated. - The exam of the esophagus was otherwise normal. - The entire examined stomach was normal. - The duodenal bulb and second portion of the duodenum were normal.   Barium swallow 01/22/20 - distal Shatski ring, extrinsic compression of the esophagus    Past Medical History:  Diagnosis Date  . Allergy   . Arthritis   . CKD (chronic kidney disease) stage 3, GFR 30-59 ml/min   . Epilepsy, unspecified, not intractable, without status epilepticus ([place])   . Esophageal obstruction   . GERD (gastroesophageal reflux disease)   . Hyperlipidemia   . Hypertension   . 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 Seizures ([place])      Past Surgical History:  Procedure Laterality Date  . APPENDECTOMY    . TONSILLECTOMY     Family History  Problem Relation Age of Onset  . Heart disease Mother        Had a CABG  . Colon cancer Father   . Seizures Daughter   . Diabetes Paternal Aunt   . Stomach cancer Neg Hx   . Esophageal cancer Neg Hx   . Rectal cancer Neg Hx   . Liver cancer Neg Hx    Social History   Tobacco Use  . Smoking status: Never Smoker  .  Smokeless tobacco: Never Used  Substance Use Topics  . Alcohol use: Yes    Comment: 1-2 per year  . Drug use: No   Current Outpatient Medications  Medication Sig Dispense Refill  . allopurinol (ZYLOPRIM) 300 MG tablet TAKE ONE TABLET BY MOUTH DAILY 30 tablet 1  . azelastine (ASTELIN) 0.1 % nasal spray Place 1 spray into both nostrils 2 (two) times daily. Use in each nostril for no more than 2 days if there is a nosebleed. (Patient taking differently: Place 1 spray into both nostrils as needed. Use in each nostril for no more than 2 days if there is a nosebleed.) 30 mL 12  . fluticasone (FLONASE) 50 MCG/ACT nasal spray Place 2 sprays into both nostrils daily. (Patient taking differently: Place 2 sprays into both nostrils as needed. ) 16 g 6  . furosemide (LASIX) 20 MG tablet Take 20 mg by mouth daily.    .  hydrochlorothiazide (HYDRODIURIL) 25 MG tablet TAKE 1 TABLET (25 MG TOTAL) BY MOUTH DAILY. 90 tablet 3  . lisinopril (PRINIVIL,ZESTRIL) 40 MG tablet TAKE ONE TABLET BY MOUTH DAILY 90 tablet 0  . PHENobarbital (LUMINAL) 64.8 MG tablet TAKE ONE AND ONE-HALF (1 & 1/2) TABLET BY MOUTH AT BEDTIME 60 tablet 3  . polyethylene glycol powder (GLYCOLAX/MIRALAX) powder Take 17 g by mouth 2 (two) times daily as needed. (Patient taking differently: Take 17 g by mouth as needed. ) 3350 g 1  . Potassium Chloride ER 20 MEQ TBCR TAKE 1 TABLETS BY MOUTH DAILY TO TWICE DAILY AS DIRECTED. 180 tablet 0  . pravastatin (PRAVACHOL) 40 MG tablet TAKE 1 TABLET (40 MG TOTAL) BY MOUTH DAILY. 90 tablet 0   No current facility-administered medications for this visit.   Allergies  Allergen Reactions  . 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  Marland Kitchen Penicillins      Review of Systems: All systems reviewed and negative except where noted in HPI.    Lab Results  Component Value Date   WBC 6.1 09/21/2016   HGB 13.4 09/21/2016   HCT 40.4 09/21/2016   MCV 92.6 09/21/2016   PLT 203.0 09/21/2016    Lab Results  Component Value Date   CREATININE 1.60 (H) 06/11/2017   BUN 21 06/11/2017   NA 142 06/11/2017   K 3.6 06/11/2017   CL 100 06/11/2017   CO2 25 06/11/2017       Physical Exam: BP 112/80 (BP Location: Left Arm, Patient Position: Sitting, Cuff Size: Normal)   Pulse 84   Ht 5\' 7"  (1.702 m) Comment: height measured without shoes  Wt 218 lb 4 oz (99 kg)   BMI 34.18 kg/m  Constitutional: Pleasant,well-developed, male in no acute distress. HEENT: Normocephalic and atraumatic. Conjunctivae are normal. No scleral icterus. Cardiovascular: Normal rate, regular rhythm.  Pulmonary/chest: Effort normal and breath sounds normal. No wheezing, rales or rhonchi. Abdominal: Soft, nondistended, nontender.There are no masses palpable.  Extremities: no edema Lymphadenopathy: No  cervical adenopathy noted. Neurological: Alert and oriented to person place and time. Skin: Skin is warm and dry. No rashes noted. Psychiatric: Normal mood and affect. Behavior is normal.   ASSESSMENT AND PLAN: 69 year old male here for assessment of the following:  Dysphagia / Schatzki ring - as above he had an EGD with dilation in 2018 which resolved the symptoms, they have since recurred.  I suspect his symptoms are very likely due to the known Schatzki ring at the [place] as  seen on his recent barium study, however he also does have some extrinsic compression of the thoracic esophagus, potentially due to his thyroid.  Recommend evaluation for that issue with an ultrasound of his thyroid and neck to ensure no concerning pathology there.  He was agreeable to this.  Otherwise I am recommending an EGD to reevaluate his esophagus and perform dilation of the shatski ring has he had previously.  I discussed risk and benefits of EGD and anesthesia with him and he wants to proceed.  Further recommendations pending the results and his course  Family history of colon cancer - father had colon cancer, he is not aware of when his last colonoscopy was, thinks it was several years ago, we have not been able to get any records of this. In this light I offered him a colonoscopy to be done at the same time as his upper endoscopy and he wanted to proceed with that.  Further recommendations pending results.  Irene Cellar, MD [facility]  CC: Vincente Liberty, MD

## 2020-03-20 ENCOUNTER — Ambulatory Visit (HOSPITAL_COMMUNITY)
Admission: RE | Admit: 2020-03-20 | Discharge: 2020-03-20 | Disposition: A | Discharge status: Home / Self Care | Payer: Managed Care, Other (non HMO) | Source: Ambulatory Visit | Attending: Gastroenterology | Admitting: Gastroenterology

## 2020-03-20 ENCOUNTER — Other Ambulatory Visit: Payer: Self-pay

## 2020-03-20 DIAGNOSIS — R590 Localized enlarged lymph nodes: Secondary | ICD-10-CM | POA: Insufficient documentation

## 2020-03-20 DIAGNOSIS — K222 Esophageal obstruction: Secondary | ICD-10-CM | POA: Diagnosis present

## 2020-03-20 DIAGNOSIS — R1310 Dysphagia, unspecified: Secondary | ICD-10-CM | POA: Insufficient documentation

## 2020-03-21 ENCOUNTER — Other Ambulatory Visit: Payer: Self-pay

## 2020-03-21 DIAGNOSIS — N189 Chronic kidney disease, unspecified: Secondary | ICD-10-CM

## 2020-04-12 ENCOUNTER — Telehealth: Payer: Self-pay | Admitting: Gastroenterology

## 2020-04-12 ENCOUNTER — Telehealth: Payer: Self-pay

## 2020-04-12 NOTE — Telephone Encounter (Signed)
Can you please provider a letter for his employer if he needs the day off to prepare for this exam. Thanks

## 2020-04-12 NOTE — Telephone Encounter (Signed)
Patient is calling, he has a procedure on 7/19 and he states that hes boss told him he can not have the day before or the day of the procedure off unless he has a doctors note. 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 He is asking if Dr. Havery Moros would write him a doctors note so he could have procedure.

## 2020-04-12 NOTE — Telephone Encounter (Signed)
Called patient back, and he states he showed his manager the letter we sent him with the instructions/appt. For his EGD/Colonoscopy, but his boss still will not OK his being off the day before for his prep (he works 3rd shift) or the day of the procedure without a note from his Dr. Edison Simon he needs off. Please advise

## 2020-04-12 NOTE — Telephone Encounter (Signed)
To Whom it may concern:   Wesley Mccarthy needs to be off work on July 18th and July 19th , 2021 to have a procedure done at our facility. This involves a prep he needs to be home for on the evening of July 18th and a procedure on July 19th, for which he will be getting sedation and can not work the rest of the day. If there are any questions about this matter you can reach me at [phone].  Thank-you, Dr. Tacy Dura.I.

## 2020-04-12 NOTE — Telephone Encounter (Signed)
Wrote letter for patient's employer of need to be off for colonoscopy. Called patient and he wants to pick it up. Put letter at front desk

## 2020-04-22 ENCOUNTER — Encounter: Payer: Self-pay | Admitting: Gastroenterology

## 2020-04-22 ENCOUNTER — Ambulatory Visit (AMBULATORY_SURGERY_CENTER): Payer: Managed Care, Other (non HMO) | Admitting: Gastroenterology

## 2020-04-22 ENCOUNTER — Other Ambulatory Visit: Payer: Self-pay

## 2020-04-22 ENCOUNTER — Other Ambulatory Visit (INDEPENDENT_AMBULATORY_CARE_PROVIDER_SITE_OTHER): Payer: Managed Care, Other (non HMO)

## 2020-04-22 VITALS — BP 128/75 | HR 59 | Temp 97.5°F | Resp 17 | Ht 67.0 in | Wt 218.0 lb

## 2020-04-22 DIAGNOSIS — Z1211 Encounter for screening for malignant neoplasm of colon: Secondary | ICD-10-CM

## 2020-04-22 DIAGNOSIS — R1310 Dysphagia, unspecified: Secondary | ICD-10-CM

## 2020-04-22 DIAGNOSIS — K222 Esophageal obstruction: Secondary | ICD-10-CM

## 2020-04-22 DIAGNOSIS — D125 Benign neoplasm of sigmoid colon: Secondary | ICD-10-CM | POA: Diagnosis not present

## 2020-04-22 DIAGNOSIS — K2970 Gastritis, unspecified, without bleeding: Secondary | ICD-10-CM | POA: Diagnosis not present

## 2020-04-22 DIAGNOSIS — D127 Benign neoplasm of rectosigmoid junction: Secondary | ICD-10-CM | POA: Diagnosis not present

## 2020-04-22 DIAGNOSIS — K3189 Other diseases of stomach and duodenum: Secondary | ICD-10-CM | POA: Diagnosis not present

## 2020-04-22 DIAGNOSIS — K449 Diaphragmatic hernia without obstruction or gangrene: Secondary | ICD-10-CM | POA: Diagnosis not present

## 2020-04-22 DIAGNOSIS — K219 Gastro-esophageal reflux disease without esophagitis: Secondary | ICD-10-CM | POA: Diagnosis not present

## 2020-04-22 DIAGNOSIS — N189 Chronic kidney disease, unspecified: Secondary | ICD-10-CM | POA: Diagnosis not present

## 2020-04-22 DIAGNOSIS — D128 Benign neoplasm of rectum: Secondary | ICD-10-CM | POA: Diagnosis not present

## 2020-04-22 DIAGNOSIS — K317 Polyp of stomach and duodenum: Secondary | ICD-10-CM | POA: Diagnosis not present

## 2020-04-22 DIAGNOSIS — Z800 Family history of malignant neoplasm of digestive organs: Secondary | ICD-10-CM | POA: Diagnosis not present

## 2020-04-22 LAB — BUN: BUN: 15 mg/dL (ref 6–23)

## 2020-04-22 LAB — CREATININE, SERUM: Creatinine, Ser: 1.56 mg/dL — ABNORMAL HIGH (ref 0.40–1.50)

## 2020-04-22 MED ORDER — SODIUM CHLORIDE 0.9 % IV SOLN: 500.0000 mL | Freq: Once | INTRAVENOUS | Status: DC

## 2020-04-22 MED ORDER — OMEPRAZOLE 20 MG PO CPDR: 20.0000 mg | DELAYED_RELEASE_CAPSULE | Freq: Every day | ORAL | 0 refills | Status: DC

## 2020-04-22 NOTE — Op Note (Signed)
[facility] Patient Name: Wesley Mccarthy Procedure Date: 04/22/2020 2:46 PM MRN: [id number] Endoscopist: Remo Lipps P. Havery Moros , MD Age: 69 Referring MD:  Date of Birth: 03-27-1951 Gender: Male Account #: [id number] Procedure:                Colonoscopy Indications:              Screening patient at increased risk: Family history                            of 1st-degree relative with colorectal cancer at                            age > 39 years old Medicines:                Monitored Anesthesia Care Procedure:                Pre-Anesthesia Assessment:                           - Prior to the procedure, a History and Physical                            was performed, and patient medications and                            allergies were reviewed. The patient's tolerance of                            previous anesthesia was also reviewed. The risks                            and benefits of the procedure and the sedation                            options and risks were discussed with the patient.                            All questions were answered, and informed consent                            was obtained. Prior Anticoagulants: The patient has                            taken no previous anticoagulant or antiplatelet                            agents. ASA Grade Assessment: II - A patient with                            mild systemic disease. After reviewing the risks                            and benefits, the patient was deemed in  satisfactory condition to undergo the procedure.                           After obtaining informed consent, the colonoscope                            was passed under direct vision. Throughout the                            procedure, the patient's blood pressure, pulse, and                            oxygen saturations were monitored continuously. The                            Colonoscope was introduced through the  anus and                            advanced to the the cecum, identified by                            appendiceal orifice and ileocecal valve. The                            colonoscopy was performed without difficulty. The                            patient tolerated the procedure well. The quality                            of the bowel preparation was adequate. The                            ileocecal valve, appendiceal orifice, and rectum                            were photographed. Scope In: 3:07:21 PM Scope Out: 3:25:18 PM Scope Withdrawal Time: 0 hours 12 minutes 20 seconds  Total Procedure Duration: 0 hours 17 minutes 57 seconds  Findings:                 A few small-mouthed diverticula were found in the                            transverse colon.                           A 5 mm polyp was found in the sigmoid colon. The                            polyp was sessile. The polyp was removed with a                            cold snare. Resection and retrieval were complete.  Two sessile polyps were found in the recto-sigmoid                            colon. The polyps were 3 to 4 mm in size. These                            polyps were removed with a cold snare. Resection                            and retrieval were complete.                           Two sessile polyps were found in the rectum. The                            polyps were 4 to 5 mm in size. These polyps were                            removed with a cold snare. Resection and retrieval                            were complete.                           Internal hemorrhoids were found during                            retroflexion. The hemorrhoids were small.                           Prep in right colon and cecum was fair upon                            insertion. Time spent lavaging the right colon to                            achieve adequate views. The exam was otherwise                             without abnormality. Complications:            No immediate complications. Estimated blood loss:                            Minimal. Estimated Blood Loss:     Estimated blood loss was minimal. Impression:               - Diverticulosis in the transverse colon.                           - One 5 mm polyp in the sigmoid colon, removed with                            a cold snare. Resected and retrieved.                           -  Two 3 to 4 mm polyps at the recto-sigmoid colon,                            removed with a cold snare. Resected and retrieved.                           - Two 4 to 5 mm polyps in the rectum, removed with                            a cold snare. Resected and retrieved.                           - Internal hemorrhoids.                           - The examination was otherwise normal. Recommendation:           - Patient has a contact number available for                            emergencies. The signs and symptoms of potential                            delayed complications were discussed with the                            patient. Return to normal activities tomorrow.                            Written discharge instructions were provided to the                            patient.                           - Resume previous diet.                           - Continue present medications.                           - Await pathology results. Remo Lipps P. Avah Bashor, MD 04/22/2020 3:32:03 PM This report has been signed electronically.

## 2020-04-22 NOTE — Op Note (Signed)
[facility] Patient Name: Wesley Mccarthy Procedure Date: 04/22/2020 2:47 PM MRN: [id number] Endoscopist: Remo Lipps P. Havery Moros , MD Age: 69 Referring MD:  Date of Birth: [birth year] Gender: Male Account #: [id number] Procedure:                Upper GI endoscopy Indications:              Dysphagia - history of GEJ stricture, also with                            suspected extrinsic compression of thoracic                            esophagus. US neck done, CT pending for borderline                            enlarged lymphadenopathy Medicines:                Monitored Anesthesia Care Procedure:                Pre-Anesthesia Assessment:                           - Prior to the procedure, a History and Physical                            was performed, and patient medications and                            allergies were reviewed. The patient's tolerance of                            previous anesthesia was also reviewed. The risks                            and benefits of the procedure and the sedation                            options and risks were discussed with the patient.                            All questions were answered, and informed consent                            was obtained. Prior Anticoagulants: The patient has                            taken no previous anticoagulant or antiplatelet                            agents. ASA Grade Assessment: II - A patient with                            mild systemic disease. After reviewing the risks  and benefits, the patient was deemed in                            satisfactory condition to undergo the procedure.                           After obtaining informed consent, the endoscope was                            passed under direct vision. Throughout the                            procedure, the patient's blood pressure, pulse, and                            oxygen saturations were monitored  continuously. The                            Endoscope was introduced through the mouth, and                            advanced to the second part of duodenum. The upper                            GI endoscopy was accomplished without difficulty.                            The patient tolerated the procedure well. Scope In: Scope Out: Findings:                 Esophagogastric landmarks were identified: the                            Z-line was found at 37 cm, the gastroesophageal                            junction was found at 37 cm and the upper extent of                            the gastric folds was found at 39 cm from the                            incisors.                           A 2 cm hiatal hernia was present.                           One benign-appearing, intrinsic mild stenosis was                            found 37 cm from the incisors. This stenosis  measured less than one cm (in length). A TTS                            dilator was passed through the scope. Dilation with                            an 18-19-20 mm balloon dilator was performed to 18                            mm with an appropriate mucosal wrent noted. There                            was some subtle nodularity noted around the 9                            oclock position of the stricture, suspect                            inflammatory, biopsies were taken with a cold                            forceps for histology.                           The exam of the esophagus was otherwise normal. No                            obvious stenosis in the thoracic esophagus.                           Patchy mild inflammation characterized by erosions                            and erythema was found in the gastric antrum.                           The exam of the stomach was otherwise normal.                           Biopsies were taken with a cold forceps in the                             gastric body, at the incisura and in the gastric                            antrum for Helicobacter pylori testing.                           The duodenal bulb and second portion of the                            duodenum were normal. Complications:            No immediate complications. Estimated  blood loss:                            Minimal. Estimated Blood Loss:     Estimated blood loss was minimal. Impression:               - Esophagogastric landmarks identified.                           - 2 cm hiatal hernia.                           - Benign-appearing esophageal stenosis. Dilated to                            13mm. Biopsied.                           - Mild gastritis as outlined. Normal stomach                            otherwise, biopsies taken to rule out H pylori.                           - Normal duodenal bulb and second portion of the                            duodenum. Recommendation:           - Patient has a contact number available for                            emergencies. The signs and symptoms of potential                            delayed complications were discussed with the                            patient. Return to normal activities tomorrow.                            Written discharge instructions were provided to the                            patient.                           - Post dilation diet, await course post dilation.                           - Continue present medications.                           - Start omeprazole 20mg  / day for 1 month to treat                            gastritis                           -  Await pathology results.                           - Go to lab for BUN / Cr as ordered to check renal                            function, proceed with CT neck as previously                            recommended to evaluate US findings Remo Lipps P. Elyanah Farino, MD 04/22/2020 3:39:07 PM This report has been signed electronically.

## 2020-04-22 NOTE — Progress Notes (Signed)
A and O x3. Report to RN. Tolerated MAC anesthesia well.Teeth unchanged after procedure.

## 2020-04-22 NOTE — Progress Notes (Signed)
V/S-CW  Check-in-JB

## 2020-04-22 NOTE — Progress Notes (Signed)
robinol antisialogogue  Lidocaine   buffer 

## 2020-04-22 NOTE — Progress Notes (Signed)
Called to room to assist during endoscopic procedure.  Patient ID and intended procedure confirmed with present staff. Received instructions for my participation in the procedure from the performing physician.  

## 2020-04-22 NOTE — Patient Instructions (Addendum)
Handouts provided on polyps, hemorrhoids, diverticulosis, hiatal hernia, gastritis, esophageal stricture and post-dilation diet.   Take Omeprazole 20mg  daily for 1 month to treat gastritis.   Call Dr. Doyne Keel office to have the CT scan scheduled. Lab work to be completed today.  YOU HAD AN ENDOSCOPIC PROCEDURE TODAY AT [facility]:   Refer to the procedure report that was given to you for any specific questions about what was found during the examination.  If the procedure report does not answer your questions, please call your gastroenterologist to clarify.  If you requested that your care partner not be given the details of your procedure findings, then the procedure report has been included in a sealed envelope for you to review at your convenience later.  YOU SHOULD EXPECT: Some feelings of bloating in the abdomen. Passage of more gas than usual.  Walking can help get rid of the air that was put into your GI tract during the procedure and reduce the bloating. If you had a lower endoscopy (such as a colonoscopy or flexible sigmoidoscopy) you may notice spotting of blood in your stool or on the toilet paper. If you underwent a bowel prep for your procedure, you may not have a normal bowel movement for a few days.  Please Note:  You might notice some irritation and congestion in your nose or some drainage.  This is from the oxygen used during your procedure.  There is no need for concern and it should clear up in a day or so.  SYMPTOMS TO REPORT IMMEDIATELY:   Following lower endoscopy (colonoscopy or flexible sigmoidoscopy):  Excessive amounts of blood in the stool  Significant tenderness or worsening of abdominal pains  Swelling of the abdomen that is new, acute  Fever of 100F or higher   Following upper endoscopy (EGD)  Vomiting of blood or coffee ground material  New chest pain or pain under the shoulder blades  Painful or persistently difficult swallowing  New  shortness of breath  Fever of 100F or higher  Black, tarry-looking stools  For urgent or emergent issues, a gastroenterologist can be reached at any hour by calling [phone]. Do not use MyChart messaging for urgent concerns.    DIET:  Clear liquids for 1 hour, then a soft diet for the rest of today (see handout). If you are tolerating swallowing, you may resume your regular diet tomorrow. Drink plenty of fluids but you should avoid alcoholic beverages for 24 hours.  ACTIVITY:  You should plan to take it easy for the rest of today and you should NOT DRIVE or use heavy machinery until tomorrow (because of the sedation medicines used during the test).    FOLLOW UP: Our staff will call the number listed on your records 48-72 hours following your procedure to check on you and address any questions or concerns that you may have regarding the information given to you following your procedure. If we do not reach you, we will leave a message.  We will attempt to reach you two times.  During this call, we will ask if you have developed any symptoms of COVID 19. If you develop any symptoms (ie: fever, flu-like symptoms, shortness of breath, cough etc.) before then, please call [phone].  If you test positive for Covid 19 in the 2 weeks post procedure, please call and report this information to [place].    If any biopsies were taken you will be contacted by phone or by letter within the  next 1-3 weeks.  Please call us at [phone] if you have not heard about the biopsies in 3 weeks.    SIGNATURES/CONFIDENTIALITY: You and/or your care partner have signed paperwork which will be entered into your electronic medical record.  These signatures attest to the fact that that the information above on your After Visit Summary has been reviewed and is understood.  Full responsibility of the confidentiality of this discharge information lies with you and/or your care-partner.

## 2020-04-24 ENCOUNTER — Telehealth: Payer: Self-pay

## 2020-04-24 NOTE — Telephone Encounter (Signed)
°  Follow up Call-  Call back number 04/22/2020  Post procedure Call Back phone  # [phone]  Permission to leave phone message Yes  Some recent data might be hidden     Patient questions:  Do you have a fever, pain , or abdominal swelling? No. Pain Score  0 *  Have you tolerated food without any problems? Yes.    Have you been able to return to your normal activities? Yes.    Do you have any questions about your discharge instructions: Diet   No. Medications  No. Follow up visit  No.  Do you have questions or concerns about your Care? No.  Actions: * If pain score is 4 or above: No action needed, pain <4.   1. Have you developed a fever since your procedure? no  2.   Have you had an respiratory symptoms (SOB or cough) since your procedure? no  3.   Have you tested positive for COVID 19 since your procedure no  4.   Have you had any family members/close contacts diagnosed with the COVID 19 since your procedure?  no   If yes to any of these questions please route to Joylene John, RN and Erenest Rasher, RN

## 2020-04-25 ENCOUNTER — Encounter: Payer: Self-pay | Admitting: Gastroenterology

## 2020-04-30 ENCOUNTER — Telehealth: Payer: Self-pay | Admitting: Gastroenterology

## 2020-04-30 DIAGNOSIS — R1310 Dysphagia, unspecified: Secondary | ICD-10-CM

## 2020-04-30 DIAGNOSIS — R591 Generalized enlarged lymph nodes: Secondary | ICD-10-CM

## 2020-04-30 DIAGNOSIS — R933 Abnormal findings on diagnostic imaging of other parts of digestive tract: Secondary | ICD-10-CM

## 2020-04-30 NOTE — Telephone Encounter (Signed)
Requesting appointment for CT

## 2020-04-30 NOTE — Telephone Encounter (Signed)
Patient has been scheduled for CT neck with contrast at [facility] for 05/09/20 at 8:15 arrival at [facility] .  He is asked to be liquids only for 4 hours prior to the scan

## 2020-05-07 ENCOUNTER — Telehealth: Payer: Self-pay | Admitting: Gastroenterology

## 2020-05-09 ENCOUNTER — Ambulatory Visit (HOSPITAL_COMMUNITY): Payer: Managed Care, Other (non HMO)

## 2020-05-18 ENCOUNTER — Other Ambulatory Visit: Payer: Self-pay | Admitting: Gastroenterology

## 2020-05-18 DIAGNOSIS — K2970 Gastritis, unspecified, without bleeding: Secondary | ICD-10-CM

## 2020-05-21 ENCOUNTER — Encounter (HOSPITAL_COMMUNITY): Payer: Self-pay

## 2020-05-21 ENCOUNTER — Other Ambulatory Visit: Payer: Self-pay

## 2020-05-21 ENCOUNTER — Ambulatory Visit (HOSPITAL_COMMUNITY)
Admission: RE | Admit: 2020-05-21 | Discharge: 2020-05-21 | Disposition: A | Discharge status: Home / Self Care | Payer: Managed Care, Other (non HMO) | Source: Ambulatory Visit | Attending: Gastroenterology | Admitting: Gastroenterology

## 2020-05-21 DIAGNOSIS — R1310 Dysphagia, unspecified: Secondary | ICD-10-CM | POA: Diagnosis not present

## 2020-05-21 DIAGNOSIS — R933 Abnormal findings on diagnostic imaging of other parts of digestive tract: Secondary | ICD-10-CM | POA: Insufficient documentation

## 2020-05-21 DIAGNOSIS — R591 Generalized enlarged lymph nodes: Secondary | ICD-10-CM | POA: Insufficient documentation

## 2020-05-21 MED ORDER — SODIUM CHLORIDE (PF) 0.9 % IJ SOLN
INTRAMUSCULAR | Status: AC
  Filled 2020-05-21: qty 50

## 2020-05-21 MED ORDER — IOHEXOL 300 MG/ML  SOLN
75.0000 mL | Freq: Once | INTRAMUSCULAR | Status: AC | PRN
  Administered 2020-05-21: 75 mL via INTRAVENOUS

## 2020-05-30 ENCOUNTER — Telehealth: Payer: Self-pay | Admitting: Gastroenterology

## 2020-05-30 DIAGNOSIS — K2970 Gastritis, unspecified, without bleeding: Secondary | ICD-10-CM

## 2020-05-30 MED ORDER — OMEPRAZOLE 20 MG PO CPDR: 20.0000 mg | DELAYED_RELEASE_CAPSULE | Freq: Every day | ORAL | 5 refills | Status: DC

## 2020-05-30 NOTE — Telephone Encounter (Signed)
Called and spoke to pt.  He wants to stay on omeprazole as he finds it is improving his symptoms. Refill sent

## 2020-10-10 ENCOUNTER — Other Ambulatory Visit (HOSPITAL_COMMUNITY): Payer: Self-pay | Admitting: Pulmonary Disease

## 2020-10-10 ENCOUNTER — Ambulatory Visit (HOSPITAL_COMMUNITY)
Admission: RE | Admit: 2020-10-10 | Discharge: 2020-10-10 | Disposition: A | Discharge status: Home / Self Care | Payer: Managed Care, Other (non HMO) | Source: Ambulatory Visit | Attending: Pulmonary Disease | Admitting: Pulmonary Disease

## 2020-10-10 ENCOUNTER — Other Ambulatory Visit: Payer: Self-pay

## 2020-10-10 DIAGNOSIS — I10 Essential (primary) hypertension: Secondary | ICD-10-CM | POA: Diagnosis present

## 2020-11-30 ENCOUNTER — Other Ambulatory Visit: Payer: Self-pay | Admitting: Gastroenterology

## 2020-11-30 DIAGNOSIS — K2970 Gastritis, unspecified, without bleeding: Secondary | ICD-10-CM

## 2021-02-06 ENCOUNTER — Encounter (HOSPITAL_COMMUNITY): Payer: Self-pay

## 2021-02-06 ENCOUNTER — Other Ambulatory Visit: Payer: Self-pay

## 2021-02-06 ENCOUNTER — Ambulatory Visit (HOSPITAL_COMMUNITY)
Admission: EM | Admit: 2021-02-06 | Discharge: 2021-02-06 | Disposition: A | Discharge status: Home / Self Care | Payer: Managed Care, Other (non HMO)

## 2021-02-06 DIAGNOSIS — M25561 Pain in right knee: Secondary | ICD-10-CM | POA: Diagnosis not present

## 2021-02-06 NOTE — ED Triage Notes (Signed)
Pt c/o right lower leg pain since yesterday. Reports the pain runs along the outer aspect of the leg. Denies any injury.

## 2021-02-06 NOTE — ED Provider Notes (Signed)
Wesley Mccarthy    CSN: [id number] Arrival date & time: 02/06/21  1004      History   Chief Complaint Chief Complaint  Patient presents with  . Leg Pain    HPI Wesley Mccarthy is a 70 y.o. male.   With multiple medical problems presents urgent care today with complaints of right knee pain.  Patient states he actually had discomfort yesterday but has since resolved.  Pain in right lateral knee radiated down side of leg to mid shin.  Patient did not do anything for symptoms, denies any trauma, change in activity or new footwear.  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  Patient denies any recent fever, headache, dizziness, chest pain lower extremity swelling.    Past Medical History:  Diagnosis Date  . Allergy   . Arthritis   . CKD (chronic kidney disease) stage 3, GFR 30-59 ml/min (HCC)   . Epilepsy, unspecified, not intractable, without status epilepticus ([place])   . Esophageal obstruction   . GERD (gastroesophageal reflux disease)   . Hyperlipidemia   . Hypertension   . 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 Seizures [facility])     Patient Active Problem List   Diagnosis Date Noted  . Anemia 08/22/2013  . 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 Stasis dermatitis of both legs 12/21/2012  . Edema 12/21/2012  . Seizure disorder ([place]) 08/26/2012  . HTN (hypertension) 11/11/2011  . Hyperlipemia 11/11/2011  . Gout 11/11/2011  . Obesity 11/11/2011    Past Surgical History:  Procedure Laterality Date  . APPENDECTOMY    . TONSILLECTOMY         Home Medications    Prior to Admission medications   Medication Sig Start Date End Date Taking? Authorizing Provider  allopurinol (ZYLOPRIM) 300 MG tablet TAKE ONE TABLET BY MOUTH DAILY 09/25/16  Yes Stallings, Zoe A, MD  furosemide (LASIX) 20 MG tablet Take 20 mg by mouth daily.   Yes [provider]  hydrochlorothiazide (HYDRODIURIL) 25 MG tablet TAKE 1 TABLET (25 MG TOTAL) BY MOUTH DAILY. 03/19/16  Yes  Ezekiel Slocumb, PA-C  lisinopril (PRINIVIL,ZESTRIL) 40 MG tablet TAKE ONE TABLET BY MOUTH DAILY 03/04/17  Yes Delia Chimes A, MD  omeprazole (PRILOSEC) 20 MG capsule TAKE ONE CAPSULE BY MOUTH DAILY 12/01/20  Yes Armbruster, [name], MD  Potassium Chloride ER 20 MEQ TBCR TAKE 1 TABLETS BY MOUTH DAILY TO TWICE DAILY AS DIRECTED. 06/27/16  Yes English, Stephanie D, PA  pravastatin (PRAVACHOL) 40 MG tablet TAKE 1 TABLET (40 MG TOTAL) BY MOUTH DAILY. 07/07/16  Yes Jeffery, Chelle, PA  azelastine (ASTELIN) 0.1 % nasal spray Place 1 spray into both nostrils 2 (two) times daily. Use in each nostril for no more than 2 days if there is a nosebleed. Patient not taking: Reported on 04/22/2020 09/14/16   Forrest Moron, MD  fluticasone (FLONASE) 50 MCG/ACT nasal spray Place 2 sprays into both nostrils daily. Patient not taking: Reported on 04/22/2020 11/22/17   Forrest Moron, MD  PHENobarbital (LUMINAL) 64.8 MG tablet TAKE ONE AND ONE-HALF (1 & 1/2) TABLET BY MOUTH AT BEDTIME 10/01/17   Penumalli, Vikram R, MD  polyethylene glycol powder (GLYCOLAX/MIRALAX) powder Take 17 g by mouth 2 (two) times daily as needed. Patient taking differently: Take 17 g by mouth as needed.  11/22/17   Forrest Moron, MD    Family History Family History  Problem Relation Age of Onset  . Heart disease  Mother        Had a CABG  . Colon cancer Father   . Seizures Daughter   . Diabetes Paternal Aunt   . Stomach cancer Neg Hx   . Esophageal cancer Neg Hx   . Rectal cancer Neg Hx   . Liver cancer Neg Hx     Social History Social History   Tobacco Use  . Smoking status: Never Smoker  . Smokeless tobacco: Never Used  Vaping Use  . Vaping Use: Never used  Substance Use Topics  . Alcohol use: Yes    Comment: 1-2 per year  . Drug use: No     Allergies   Norvasc [amlodipine besylate] and Penicillins   Review of Systems As stated in HPI otherwise negative   Physical Exam Triage Vital Signs ED Triage Vitals   Enc Vitals Group     BP 02/06/21 1054 137/83     Pulse Rate 02/06/21 1054 65     Resp 02/06/21 1054 18     Temp 02/06/21 1054 97.7 F (36.5 C)     Temp src --      SpO2 02/06/21 1054 97 %     Weight --      Height --      Head Circumference --      Peak Flow --      Pain Score 02/06/21 1051 2     Pain Loc --      Pain Edu? --      Excl. in [place]? --    No data found.  Updated Vital Signs BP 137/83   Pulse 65   Temp 97.7 F (36.5 C)   Resp 18   SpO2 97%   Visual Acuity Right Eye Distance:   Left Eye Distance:   Bilateral Distance:    Right Eye Near:   Left Eye Near:    Bilateral Near:     Physical Exam Constitutional:      General: He is not in acute distress.    Appearance: Normal appearance. He is normal weight. He is not ill-appearing.  Cardiovascular:     Pulses: Normal pulses.  Musculoskeletal:        General: No swelling, tenderness or deformity. Normal range of motion.     Right lower leg: No edema.  Skin:    General: Skin is warm and dry.     Findings: No bruising, erythema, lesion or rash.  Neurological:     General: No focal deficit present.     Mental Status: He is alert.     Gait: Gait normal.  Psychiatric:        Mood and Affect: Mood normal.        Behavior: Behavior normal.     UC Treatments / Results  Labs (all labs ordered are listed, but only abnormal results are displayed) Labs Reviewed - No data to display  EKG   Radiology No results found.  Procedures Procedures (including critical care time)  Medications Ordered in UC Medications - No data to display  Initial Impression / Assessment and Plan / UC Course  I have reviewed the triage vital signs and the nursing notes.  Pertinent labs & imaging results that were available during my care of the patient were reviewed by me and considered in my medical decision making (see chart for details).  Right knee pain -x1 day radiating to down leg and now completely resolved. Wife  was concerned for DVT thought I do not see  any evidence of this.  Exam completely benign  -Suspect symptoms related to osteoarthritis versus mild strain of IT band.  Patient does work on [employer] quite a bit -Active to wear supportive shoes.  If pain reoccurs try elevation and ice.  Avoid NSAIDs due to history of kidney disease -Consider Ortho referral if recurs  Reviewed expections re: course of current medical issues. Questions answered. Outlined signs and symptoms indicating need for more acute intervention. Pt verbalized understanding. AVS given   Final Clinical Impressions(s) / UC Diagnoses   Final diagnoses:  Acute pain of right knee     Discharge Instructions     I suspect the pain you are experiencing was related to osteoarthritis or perhaps mild inflammation of the ligament.  Avoid anti-inflammatories due to your history of elevated kidney function.  If pain returns I would try cold compresses several times a day for 15 to 20 minutes at a time.  If you should develop any redness, swelling or worsening pain please return for evaluation.    ED Prescriptions    None     PDMP not reviewed this encounter.   Rudolpho Sevin, NP 02/06/21 1322

## 2021-02-06 NOTE — Discharge Instructions (Addendum)
I suspect the pain you are experiencing was related to osteoarthritis or perhaps mild inflammation of the ligament.  Avoid anti-inflammatories due to your history of elevated kidney function.  If pain returns I would try cold compresses several times a day for 15 to 20 minutes at a time.  If you should develop any redness, swelling or worsening pain please return for evaluation.

## 2021-05-28 ENCOUNTER — Other Ambulatory Visit: Payer: Self-pay | Admitting: Gastroenterology

## 2021-05-28 DIAGNOSIS — K2970 Gastritis, unspecified, without bleeding: Secondary | ICD-10-CM

## 2021-07-29 ENCOUNTER — Other Ambulatory Visit: Payer: Self-pay | Admitting: Gastroenterology

## 2021-07-29 DIAGNOSIS — K2970 Gastritis, unspecified, without bleeding: Secondary | ICD-10-CM

## 2021-08-13 ENCOUNTER — Telehealth: Payer: Self-pay | Admitting: Gastroenterology

## 2021-08-13 DIAGNOSIS — K2970 Gastritis, unspecified, without bleeding: Secondary | ICD-10-CM

## 2021-08-13 MED ORDER — OMEPRAZOLE 20 MG PO CPDR: 20.0000 mg | DELAYED_RELEASE_CAPSULE | Freq: Every day | ORAL | 0 refills | Status: DC

## 2021-08-13 NOTE — Telephone Encounter (Signed)
Pt stopped at the office to request refill for omeprazole 20 mg. He uses Kristopher Oppenheim at [facility].

## 2021-08-13 NOTE — Telephone Encounter (Signed)
Patient has not been seen since 04-2020 and needs an appointment for further refills. Thank you

## 2021-08-13 NOTE — Telephone Encounter (Signed)
30 day refill sent to pharmacy 

## 2021-08-13 NOTE — Telephone Encounter (Signed)
Pt made appt to see Colleen on 09/03/21, He is aware that he needs to keep appt for further refills. He is asking if we can rf some pills to hold him until his appt.

## 2021-08-13 NOTE — Addendum Note (Signed)
Addended by: Roetta Sessions on: 08/13/2021 06:04 PM   Modules accepted: Orders

## 2021-09-03 ENCOUNTER — Encounter: Payer: Self-pay | Admitting: Nurse Practitioner

## 2021-09-03 ENCOUNTER — Ambulatory Visit (INDEPENDENT_AMBULATORY_CARE_PROVIDER_SITE_OTHER): Payer: Medicare Other | Admitting: Nurse Practitioner

## 2021-09-03 VITALS — BP 124/80 | HR 76 | Ht 67.0 in | Wt 232.1 lb

## 2021-09-03 DIAGNOSIS — R1310 Dysphagia, unspecified: Secondary | ICD-10-CM

## 2021-09-03 DIAGNOSIS — K2970 Gastritis, unspecified, without bleeding: Secondary | ICD-10-CM | POA: Diagnosis not present

## 2021-09-03 DIAGNOSIS — K222 Esophageal obstruction: Secondary | ICD-10-CM

## 2021-09-03 DIAGNOSIS — K219 Gastro-esophageal reflux disease without esophagitis: Secondary | ICD-10-CM

## 2021-09-03 MED ORDER — OMEPRAZOLE 20 MG PO CPDR: 20.0000 mg | DELAYED_RELEASE_CAPSULE | Freq: Every day | ORAL | 5 refills | Status: DC

## 2021-09-03 NOTE — Progress Notes (Signed)
Agree with assessment and plan as outlined.  

## 2021-09-03 NOTE — Progress Notes (Signed)
09/03/2021 Wesley Mccarthy [id number] [birth year]   Chief Complaint: Difficulty swallowing   History of Present Illness: Wesley Mccarthy is a 70 year old male with a past medical history of hypertension, hyperlipidemia, arthritis, epilepsy/seizures, GERD and a benign esophageal stenosis with associated dysphagia.  He is followed by Dr. Havery Moros. He presents to our office today for further evaluation regarding recurrent dysphagia.  He describes having food which gets stuck in his throat/upper esophagus which has occurred 3-4 times over the past month.  No specific food triggers but sometimes occurs if he is eating too quickly.  He gags and vomits or self induces vomiting to expel the stuck food.  He remains on Omeprazole 20 mg once daily.  No heartburn or upper abdominal pain.  He underwent an EGD 04/22/2020 which identified a benign-appearing esophageal stenosis which was dilated, a 2 cm hiatal hernia and mild gastritis without evidence of H. pylori.  He is passing a normal formed brown bowel movement daily.  No rectal bleeding or black stools.  He underwent a colonoscopy 04/22/2020 which identified 5 hyperplastic polyps which were removed from the rectum and colon.  He was advised to repeat a colonoscopy in 5 years.  Father with history of colon cancer.  He denies NSAID use.  He is gained about 10 pounds since he retired in September 2022.  He has a history of epilepsy which was diagnosed when he was in the third grade.  No seizure activity for more than 15 years.  He remains on Phenobarbital.  He underwent routine laboratory studies by Dr. Katherine Roan last week, I will request a copy of these results for further review.  EGD 04/22/2020: - Esophagogastric landmarks identified. - 2 cm hiatal hernia. - Benign-appearing esophageal stenosis. Dilated to 7mm. Biopsied. - Mild gastritis as outlined. Normal stomach otherwise, biopsies taken to rule out H pylori. - Normal duodenal bulb and second portion of the  duodenum.  Colonoscopy 04/22/2020: - Diverticulosis in the transverse colon. - One 5 mm polyp in the sigmoid colon, removed with a cold snare. Resected and retrieved. - Two 3 to 4 mm polyps at the recto-sigmoid colon, removed with a cold snare. Resected and retrieved. - Two 4 to 5 mm polyps in the rectum, removed with a cold snare. Resected and retrieved. - Internal hemorrhoids. - The examination was otherwise normal - Consider a repeat colonoscopy in 5 years  Pathology report: 1. Surgical [P], gastric antrum and body - MILD REACTIVE GASTROPATHY. Hinton Dyer IS NEGATIVE FOR HELICOBACTER PYLORI. - NO INTESTINAL METAPLASIA, DYSPLASIA, OR MALIGNANCY. 2. Surgical [P], esophagus, GE junction - REFLUX CHANGES. - NO INTESTINAL METAPLASIA, DYSPLASIA, OR MALIGNANCY. 3. Surgical [P], colon, rectum 2, rectosigmoid x 2, sigmoid x 1, polyp (5) - HYPERPLASTIC POLYP (MULTIPLE FRAGMENTS). - NO DYSPLASIA OR MALIGNANCY  EGD 10/06/2016: - A 3 cm hiatal hernia was present. - One mild benign-appearing, intrinsic stenosis (Shatski ring) was found 37 cm from the incisors. This measured less than one cm (in length) and was traversed. A TTS dilator was passed through the scope. Dilation with an 18-19-20 mm balloon dilator was performed to 18 mm and 19 mm, after which a mucosal wrent was appreciated. - The exam of the esophagus was otherwise normal. - The entire examined stomach was normal. - The duodenal bulb and second portion of the duodenum were normal.    Barium swallow 01/22/20 - distal Shatski ring, extrinsic compression of the esophagus    Past Medical History:  Diagnosis Date  Allergy    Arthritis    CKD (chronic kidney disease) stage 3, GFR 30-59 ml/min (HCC)    Epilepsy, unspecified, not intractable, without status epilepticus ([place])    Esophageal obstruction    GERD (gastroesophageal reflux disease)    Hyperlipidemia    Hypertension    Osteoarthritis 2018   knee   Seizures ([place])     Past Surgical History:  Procedure Laterality Date   APPENDECTOMY     TONSILLECTOMY     Current Outpatient Medications on File Prior to Visit  Medication Sig Dispense Refill   allopurinol (ZYLOPRIM) 300 MG tablet TAKE ONE TABLET BY MOUTH DAILY 30 tablet 1   furosemide (LASIX) 20 MG tablet Take 20 mg by mouth daily.     hydrochlorothiazide (HYDRODIURIL) 25 MG tablet TAKE 1 TABLET (25 MG TOTAL) BY MOUTH DAILY. 90 tablet 3   lisinopril (PRINIVIL,ZESTRIL) 40 MG tablet TAKE ONE TABLET BY MOUTH DAILY 90 tablet 0   PHENobarbital (LUMINAL) 64.8 MG tablet TAKE ONE AND ONE-HALF (1 & 1/2) TABLET BY MOUTH AT BEDTIME 60 tablet 3   polyethylene glycol powder (GLYCOLAX/MIRALAX) powder Take 17 g by mouth 2 (two) times daily as needed. (Patient taking differently: Take 17 g by mouth as needed.) 3350 g 1   Potassium Chloride ER 20 MEQ TBCR TAKE 1 TABLETS BY MOUTH DAILY TO TWICE DAILY AS DIRECTED. 180 tablet 0   pravastatin (PRAVACHOL) 40 MG tablet TAKE 1 TABLET (40 MG TOTAL) BY MOUTH DAILY. 90 tablet 0   No current facility-administered medications on file prior to visit.   Allergies  Allergen Reactions   Norvasc [Amlodipine Besylate]     Per patient caused his liver to shutdown.   Penicillins     Current Medications, Allergies, Past Medical History, Past Surgical History, Family History and Social History were reviewed in [employer] record.   Review of Systems:   Constitutional: Negative for fever, sweats, chills or weight loss.  Respiratory: Negative for shortness of breath.   Cardiovascular: Negative for chest pain, palpitations and leg swelling.  Gastrointestinal: See HPI.  Musculoskeletal: Negative for back pain or muscle aches.  Neurological: Negative for dizziness, headaches or paresthesias.    Physical Exam: BP 124/80   Pulse 76   Ht 5\' 7"  (1.702 m)   Wt 232 lb 2 oz (105.3 kg)   BMI 36.36 kg/m   General: 70 year old male in NAD.  Head: Normocephalic and  atraumatic. Eyes: No scleral icterus. Conjunctiva pink . Ears: Normal auditory acuity. Mouth: Dentition intact. No ulcers or lesions.  Lungs: Clear throughout to auscultation. Heart: Regular rate and rhythm, no murmur. Abdomen: Soft, nontender and nondistended. No masses or hepatomegaly. Normal bowel sounds x 4 quadrants.  Rectal: Deferred.  Musculoskeletal: Symmetrical with no gross deformities. Extremities: No edema. Neurological: Alert oriented x 4. No focal deficits.  Psychological: Alert and cooperative. Normal mood and affect  Assessment and Recommendations:  32) 70 year old male with a history of GERD and benign esophageal stenosis with recurrent dysphagia. -EGD with esophageal dilatation benefits and risks discussed including risk with sedation, risk of bleeding, perforation and infection  -Continue Omeprazole 20 mg daily, prescription refilled -Patient instructed to take 3 sips of water prior to swallowing any food or pills, cut food in small pieces, chew food thoroughly and avoid dry foods, bread and large pieces of meat -Request copy of CBC and CMP from PCPs office done last week -Patient to call our office if his symptoms worsen  2) History  of hyperplastic colon polyps per colonoscopy 04/2020.  Father with history of colon cancer. -Recall colonoscopy 04/2025  3) History of epilepsy on Phenobarbital.  Last seizure activity was 15 years ago.   Further recommendations to be determined or after the above evaluation completed

## 2021-09-03 NOTE — Patient Instructions (Signed)
PROCEDURES: You have been scheduled for a EGD. Please follow the written instructions given to you at your visit today. If you use inhalers (even only as needed), please bring them with you on the day of your procedure.  RECOMMENDATIONS: Avoid bread, large pieces of meat. Cut food into small pieces and chew food thoroughly. Please contact our office if your symptoms worsen.  It was great seeing you today! Thank you for entrusting me with your care and choosing [facility].  Noralyn Pick, CRNP  The [facility] providers would like to encourage you to use [facility] to communicate with providers for non-urgent requests or questions.  Due to long hold times on the telephone, sending your provider a message by [facility] may be faster and more efficient way to get a response. Please allow 48 business hours for a response.  Please remember that this is for non-urgent requests/questions.  If you are age 21 or older, your body mass index should be between 23-30. Your Body mass index is 36.36 kg/m. If this is out of the aforementioned range listed, please consider follow up with your Primary Care Provider.  If you are age 63 or younger, your body mass index should be between 19-25. Your Body mass index is 36.36 kg/m. If this is out of the aformentioned range listed, please consider follow up with your Primary Care Provider.

## 2021-09-16 ENCOUNTER — Ambulatory Visit: Payer: Managed Care, Other (non HMO) | Admitting: Emergency Medicine

## 2021-09-28 IMAGING — US US THYROID
1 series · 13 of 25 positions shown · non-contrast
Comparison: None.

CLINICAL DATA: Dysphagia

EXAM:
THYROID ULTRASOUND
TECHNIQUE: Ultrasound examination of the thyroid gland and adjacent soft
tissues was performed.

[Series 1: us thyroid · 67 acquisitions, 13 frames shown]
[im 1/67]
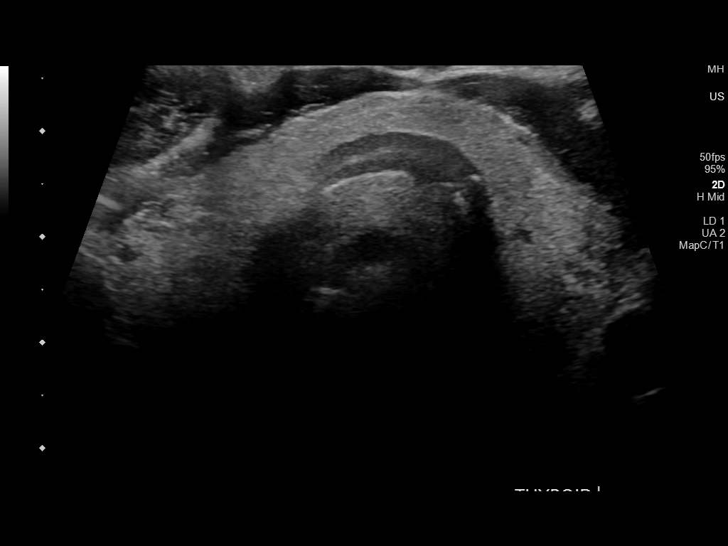
[im 6/67]
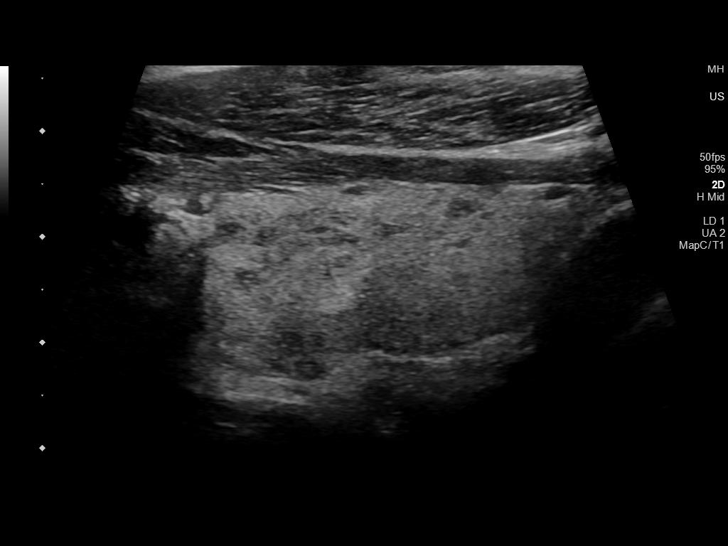
[im 12/67]
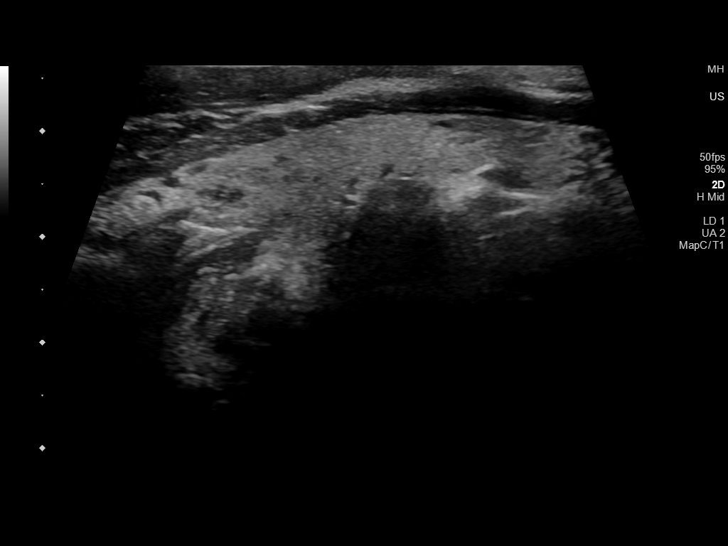
[im 17/67]
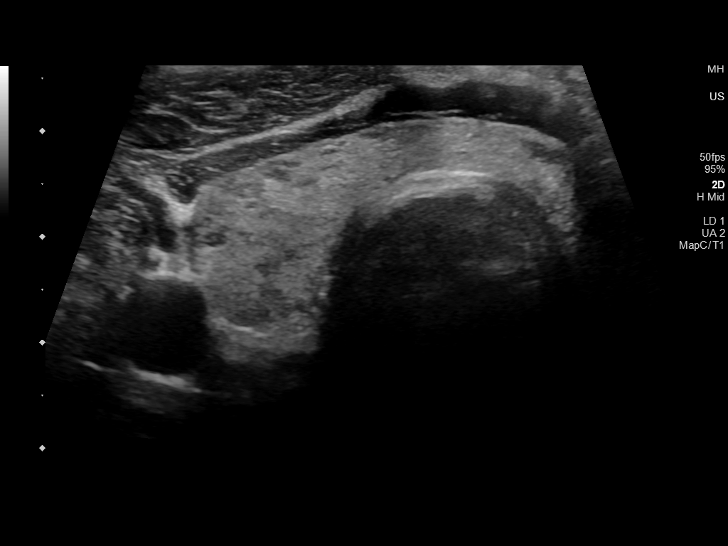
[im 23/67]
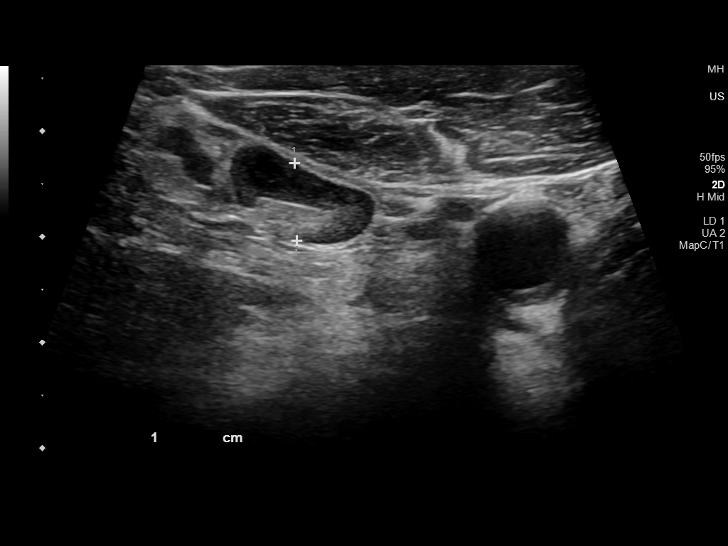
[im 28/67]
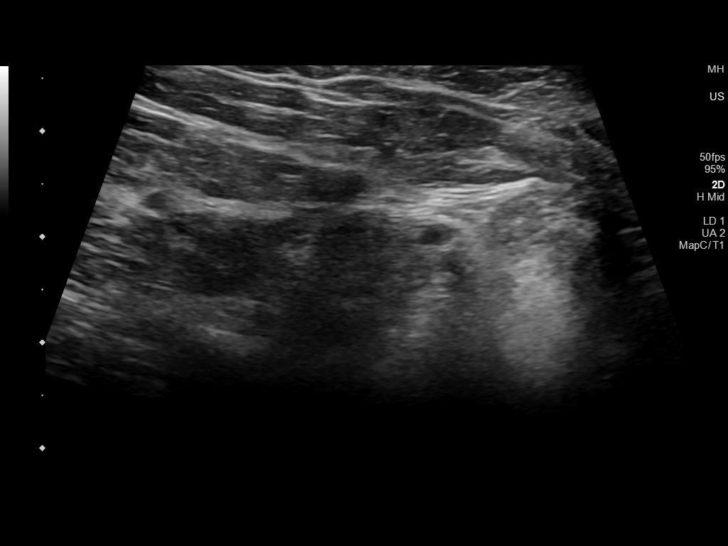
[im 34/67]
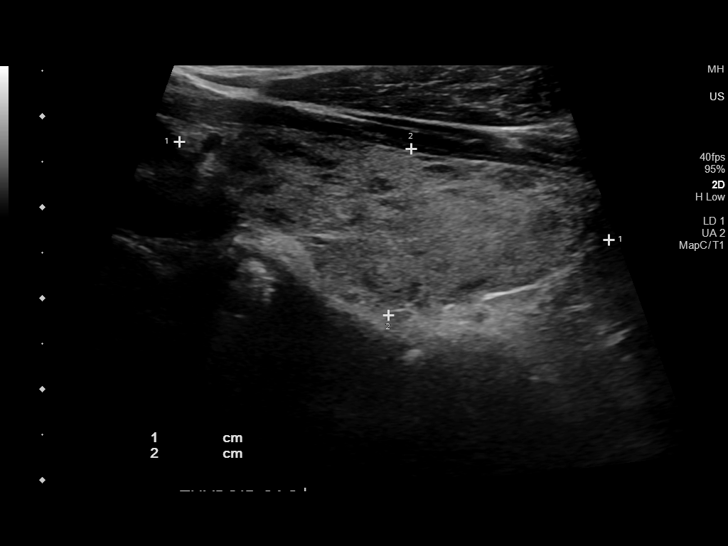
[im 39/67]
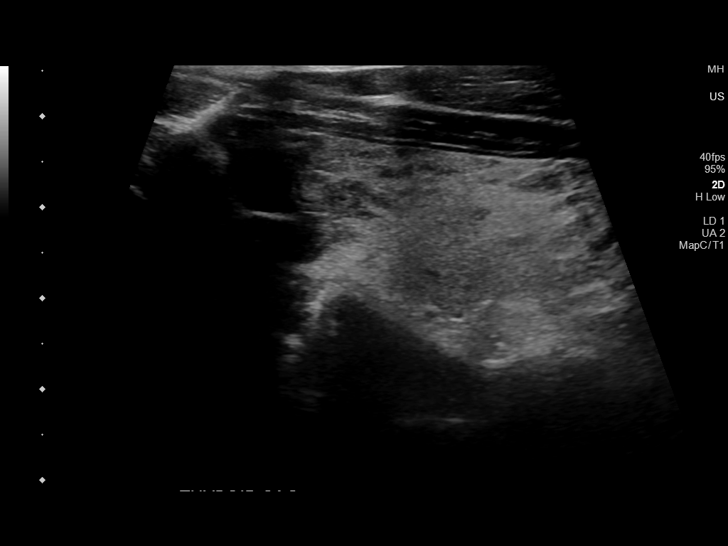
[im 45/67]
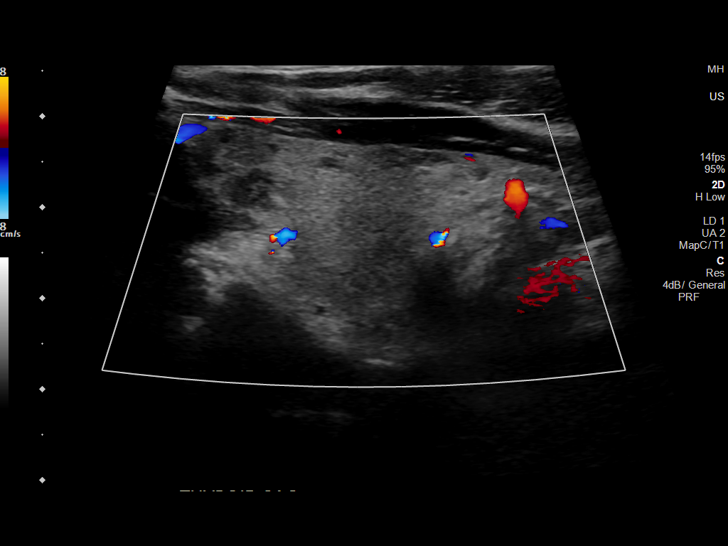
[im 50/67]
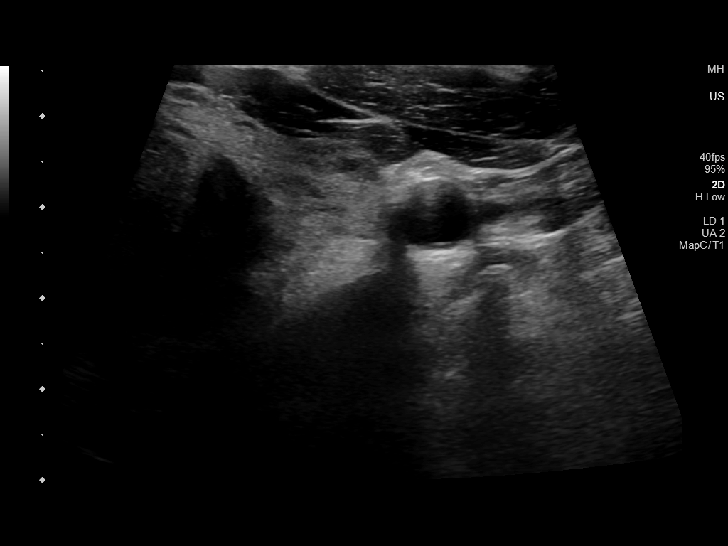
[im 56/67]
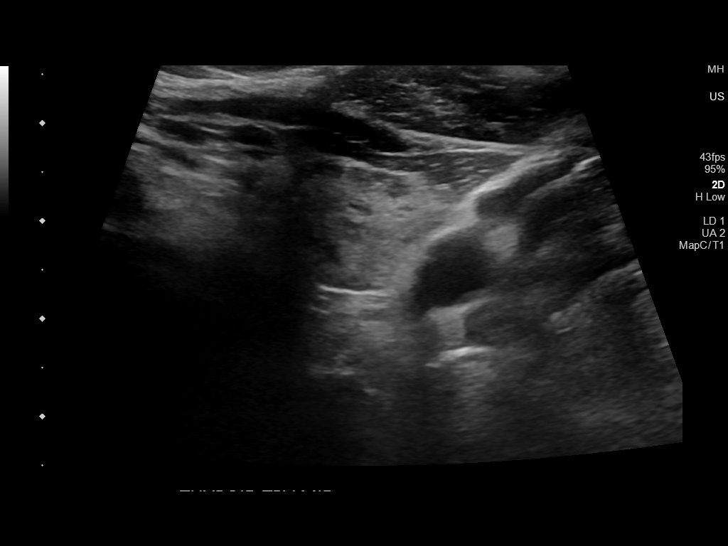
[im 61/67]
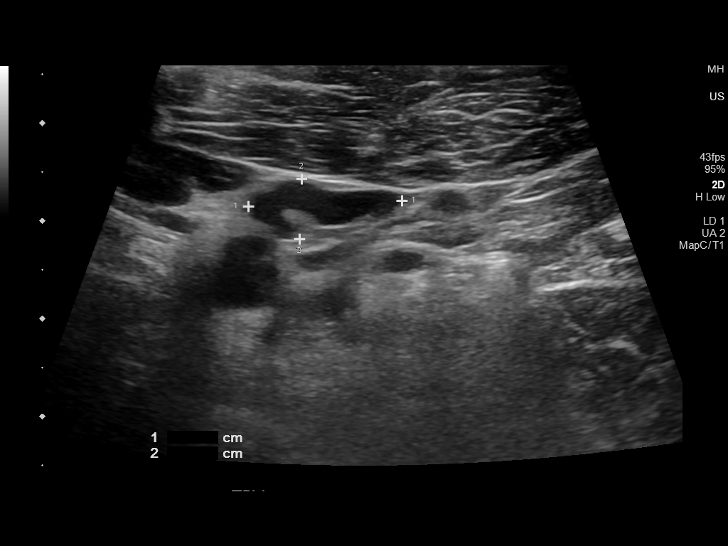
[im 67/67]
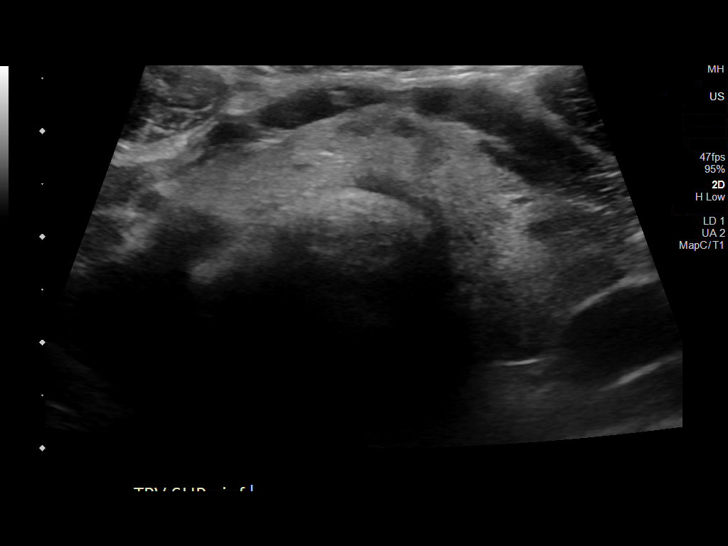

[13 of 25 positions shown; findings below may reference images not displayed]

FINDINGS: Parenchymal Echotexture: Markedly heterogenous

Isthmus: 5 mm

Right lobe: 4.3 x 1.8 x 1.5 cm

Left lobe: 4.8 x 1.9 x 1.7 cm

_________________________________________________________

Estimated total number of nodules >/= 1 cm: 0

Number of spongiform nodules >/=  2 cm not described below (TR1): 0

Number of mixed cystic and solid nodules >/= 1.5 cm not described
below (TR2): 0

_________________________________________________________

Marked thyroid heterogeneity with background subcentimeter scattered
hypoechoic and cystic nodularity all measuring 7 mm or less in size.
No significant thyroid abnormality greater than 1 cm. Normal
vascularity.

Borderline enlarged cervical lymph nodes noted on the right
measuring 1.7 cm in length and 0.8 cm in short axis. Index left
cervical lymph node measures 1.5 cm in length and 6 cm in short
axis. Lymph nodes have a thickened hypoechoic cortex but preserved
fatty hila. These are favored to be reactive.
IMPRESSION: Nonspecific thyroid heterogeneity with benign subcentimeter
nodularity. No other significant thyroid abnormality.

Borderline enlarged bilateral lower cervical lymph nodes,
nonspecific but favored to be reactive.

The above is in keeping with the ACR TI-RADS recommendations - [HOSPITAL] 6606;[DATE].

## 2021-10-17 ENCOUNTER — Encounter: Payer: Self-pay | Admitting: Gastroenterology

## 2021-10-17 ENCOUNTER — Other Ambulatory Visit: Payer: Self-pay

## 2021-10-17 ENCOUNTER — Ambulatory Visit (AMBULATORY_SURGERY_CENTER): Payer: Medicare Other | Admitting: Gastroenterology

## 2021-10-17 VITALS — BP 115/69 | HR 55 | Temp 97.8°F | Resp 21 | Ht 67.0 in | Wt 232.0 lb

## 2021-10-17 DIAGNOSIS — K2970 Gastritis, unspecified, without bleeding: Secondary | ICD-10-CM

## 2021-10-17 DIAGNOSIS — K449 Diaphragmatic hernia without obstruction or gangrene: Secondary | ICD-10-CM | POA: Diagnosis not present

## 2021-10-17 DIAGNOSIS — R1310 Dysphagia, unspecified: Secondary | ICD-10-CM | POA: Diagnosis not present

## 2021-10-17 DIAGNOSIS — K222 Esophageal obstruction: Secondary | ICD-10-CM | POA: Diagnosis not present

## 2021-10-17 DIAGNOSIS — K2950 Unspecified chronic gastritis without bleeding: Secondary | ICD-10-CM

## 2021-10-17 MED ORDER — SODIUM CHLORIDE 0.9 % IV SOLN: 500.0000 mL | Freq: Once | INTRAVENOUS | Status: DC

## 2021-10-17 NOTE — Progress Notes (Signed)
[facility] History and Physical   Primary Care Physician:  Vincente Liberty, MD   Reason for Procedure:   Dysphagia, esophageal stricture  Plan:    EGD with dilation     HPI: Wesley Mccarthy is a 71 y.o. male  here for EGD with dilation to treat distal esophageal stricture. He has had dysphagia in the past, EGD with dilation of distal esophageal stricture x 2, 1/18 and 7/21, which has treated the area and provided relief. He also has compression of the upper esophagus from osteophytes on barium study / CT neck as well. He has responded reliably well to dilation and has had recurrent symptoms of dysphagia. Have discussed risks / benefits of EGD with dilation and he wishes to proceed.  Otherwise feels well without any cardiopulmonary symptoms.    Past Medical History:  Diagnosis Date   Allergy    Arthritis    CKD (chronic kidney disease) stage 3, GFR 30-59 ml/min (HCC)    Epilepsy, unspecified, not intractable, without status epilepticus ([place])    Esophageal obstruction    GERD (gastroesophageal reflux disease)    Hyperlipidemia    Hypertension    Osteoarthritis 2018   knee   Seizures ([place])     Past Surgical History:  Procedure Laterality Date   APPENDECTOMY     TONSILLECTOMY      Prior to Admission medications   Medication Sig Start Date End Date Taking? Authorizing Provider  allopurinol (ZYLOPRIM) 300 MG tablet TAKE ONE TABLET BY MOUTH DAILY 09/25/16  Yes Stallings, Zoe A, MD  furosemide (LASIX) 20 MG tablet Take 20 mg by mouth daily.   Yes [provider]  hydrochlorothiazide (HYDRODIURIL) 25 MG tablet TAKE 1 TABLET (25 MG TOTAL) BY MOUTH DAILY. 03/19/16  Yes Ezekiel Slocumb, PA-C  lisinopril (PRINIVIL,ZESTRIL) 40 MG tablet TAKE ONE TABLET BY MOUTH DAILY 03/04/17  Yes Stallings, Zoe A, MD  omeprazole (PRILOSEC) 20 MG capsule Take 1 capsule (20 mg total) by mouth daily. Please keep your appointment this month for further refills. Thank you 09/03/21  Yes  Kennedy-Smith, Patrecia Pour, NP  PHENobarbital (LUMINAL) 64.8 MG tablet TAKE ONE AND ONE-HALF (1 & 1/2) TABLET BY MOUTH AT BEDTIME 10/01/17  Yes Penumalli, Vikram R, MD  polyethylene glycol powder (GLYCOLAX/MIRALAX) powder Take 17 g by mouth 2 (two) times daily as needed. Patient taking differently: Take 17 g by mouth as needed. 11/22/17  Yes Forrest Moron, MD  Potassium Chloride ER 20 MEQ TBCR TAKE 1 TABLETS BY MOUTH DAILY TO TWICE DAILY AS DIRECTED. 06/27/16  Yes English, Stephanie D, PA  pravastatin (PRAVACHOL) 40 MG tablet TAKE 1 TABLET (40 MG TOTAL) BY MOUTH DAILY. 07/07/16  Yes Harrison Mons, PA    Current Outpatient Medications  Medication Sig Dispense Refill   allopurinol (ZYLOPRIM) 300 MG tablet TAKE ONE TABLET BY MOUTH DAILY 30 tablet 1   furosemide (LASIX) 20 MG tablet Take 20 mg by mouth daily.     hydrochlorothiazide (HYDRODIURIL) 25 MG tablet TAKE 1 TABLET (25 MG TOTAL) BY MOUTH DAILY. 90 tablet 3   lisinopril (PRINIVIL,ZESTRIL) 40 MG tablet TAKE ONE TABLET BY MOUTH DAILY 90 tablet 0   omeprazole (PRILOSEC) 20 MG capsule Take 1 capsule (20 mg total) by mouth daily. Please keep your appointment this month for further refills. Thank you 30 capsule 5   PHENobarbital (LUMINAL) 64.8 MG tablet TAKE ONE AND ONE-HALF (1 & 1/2) TABLET BY MOUTH AT BEDTIME 60 tablet 3   polyethylene glycol powder (GLYCOLAX/MIRALAX) powder  Take 17 g by mouth 2 (two) times daily as needed. (Patient taking differently: Take 17 g by mouth as needed.) 3350 g 1   Potassium Chloride ER 20 MEQ TBCR TAKE 1 TABLETS BY MOUTH DAILY TO TWICE DAILY AS DIRECTED. 180 tablet 0   pravastatin (PRAVACHOL) 40 MG tablet TAKE 1 TABLET (40 MG TOTAL) BY MOUTH DAILY. 90 tablet 0   Current Facility-Administered Medications  Medication Dose Route Frequency Provider Last Rate Last Admin   0.9 %  sodium chloride infusion  500 mL Intravenous Once Ronnell Clinger, [name], MD        Allergies as of 10/17/2021 - Review Complete 10/17/2021   Allergen Reaction Noted   Norvasc [amlodipine besylate]  11/11/2011   Penicillins  08/11/2011    Family History  Problem Relation Age of Onset   Heart disease Mother        Had a CABG   Colon cancer Father    Seizures Daughter    Diabetes Paternal Aunt    Stomach cancer Neg Hx    Esophageal cancer Neg Hx    Rectal cancer Neg Hx    Liver cancer Neg Hx     Social History   Socioeconomic History   Marital status: Married    Spouse name: Not on file   Number of children: 3   Years of education: Not on file   Highest education level: Not on file  Occupational History   Occupation: Public house manager   Occupation: [employer]  Tobacco Use   Smoking status: Never   Smokeless tobacco: Never  Vaping Use   Vaping Use: Never used  Substance and Sexual Activity   Alcohol use: Yes    Comment: 1-2 per year   Drug use: No   Sexual activity: Yes    Birth control/protection: None  Other Topics Concern   Not on file  Social History Narrative   Married. Education: [employer].    Social Determinants of Health   Financial Resource Strain: Not on file  Food Insecurity: Not on file  Transportation Needs: Not on file  Physical Activity: Not on file  Stress: Not on file  Social Connections: Not on file  Intimate Partner Violence: Not on file    Review of Systems: All other review of systems negative except as mentioned in the HPI.  Physical Exam: Vital signs BP 123/75    Pulse 73    Temp 97.8 F (36.6 C)    Ht 5\' 7"  (1.702 m)    Wt 232 lb (105.2 kg)    SpO2 97%    BMI 36.34 kg/m   General:   Alert,  Well-developed, pleasant and cooperative in NAD Lungs:  Clear throughout to auscultation.   Heart:  Regular rate and rhythm Abdomen:  Soft, nontender and nondistended.   Neuro/Psych:  Alert and cooperative. Normal mood and affect. A and O x 3  Jolly Mango, MD [facility]

## 2021-10-17 NOTE — Progress Notes (Signed)
Sedate, gd SR, tolerated procedure well, VSS, report to RN 

## 2021-10-17 NOTE — Progress Notes (Signed)
Called to room to assist during endoscopic procedure.  Patient ID and intended procedure confirmed with present staff. Received instructions for my participation in the procedure from the performing physician.  

## 2021-10-17 NOTE — Op Note (Signed)
[facility] Patient Name: Tin Engram Procedure Date: 10/17/2021 9:52 AM MRN: [id number] Endoscopist: Remo Lipps P. Havery Moros , MD Age: 71 Referring MD:  Date of Birth: 05/15/1951 Gender: Male Account #: [id number] Procedure:                Upper GI endoscopy Indications:              Dysphagia, follow-up of esophageal stricture -                            treatment of distal esophageal stricture with                            dilation in the past has provided benefit, on                            omeprazole Medicines:                Monitored Anesthesia Care Procedure:                Pre-Anesthesia Assessment:                           - Prior to the procedure, a History and Physical                            was performed, and patient medications and                            allergies were reviewed. The patient's tolerance of                            previous anesthesia was also reviewed. The risks                            and benefits of the procedure and the sedation                            options and risks were discussed with the patient.                            All questions were answered, and informed consent                            was obtained. Prior Anticoagulants: The patient has                            taken no previous anticoagulant or antiplatelet                            agents. ASA Grade Assessment: II - A patient with                            mild systemic disease. After reviewing the risks  and benefits, the patient was deemed in                            satisfactory condition to undergo the procedure.                           After obtaining informed consent, the endoscope was                            passed under direct vision. Throughout the                            procedure, the patient's blood pressure, pulse, and                            oxygen saturations were monitored continuously. The                             Endoscope was introduced through the mouth, and                            advanced to the second part of duodenum. The upper                            GI endoscopy was accomplished without difficulty.                            The patient tolerated the procedure well. Scope In: Scope Out: Findings:                 Esophagogastric landmarks were identified: the                            Z-line was found at 35 cm, the gastroesophageal                            junction was found at 35 cm and the upper extent of                            the gastric folds was found at 39 cm from the                            incisors.                           A 4 cm hiatal hernia was present.                           One benign-appearing, intrinsic mild stenosis was                            found 39 cm from the incisors. This stenosis  measured less than one cm (in length). A TTS                            dilator was passed through the scope. Dilation with                            an 18-19-20 mm balloon dilator was performed to 18                            mm and 19 mm after which an appropriate mucosal                            wrent was noted. Biopsies were taken of the                            stricture to open it further, but not sent to                            pathology (biopsied in the past and benign, and is                            benign appearing).                           The exam of the esophagus was otherwise normal.                           Patchy erythematous mucosa was found in the gastric                            antrum without erosions / ulcerations.                           There were 2 retained large pills in the proximal                            stomach noted upon entry of the stomach, removed                            via Jabier Mutton net at the beginning of the exam to                            eliminate  aspiration risk. The stomach was                            otherwise normal.                           Biopsies were taken with a cold forceps in the                            gastric body, at the incisura and in the gastric  antrum for Helicobacter pylori testing.                           The duodenal bulb and second portion of the                            duodenum were normal. Complications:            No immediate complications. Estimated blood loss:                            Minimal. Estimated Blood Loss:     Estimated blood loss was minimal. Impression:               - Esophagogastric landmarks identified.                           - 4 cm hiatal hernia.                           - Benign-appearing esophageal stenosis. Dilated to                            55mm with good result, biopsied further                           - Normal esophagus otherwise.                           - Erythematous mucosa in the antrum.                           - Normal stomach otherwise - biopsied to rule out H                            pylori. Retained pills removed from the proximal                            stomach.                           - Normal duodenal bulb and second portion of the                            duodenum. Recommendation:           - Patient has a contact number available for                            emergencies. The signs and symptoms of potential                            delayed complications were discussed with the                            patient. Return to normal activities tomorrow.  Written discharge instructions were provided to the                            patient.                           - Post dilation diet                           - Continue present medications.                           - Await pathology results and await course post                            dilation Estrellita Lasky P. Drinda Belgard,  MD 10/17/2021 10:29:32 AM This report has been signed electronically.

## 2021-10-17 NOTE — Patient Instructions (Addendum)
Handouts on Post esophageal dilation diet,  gastritis, and stricture given.   YOU HAD AN ENDOSCOPIC PROCEDURE TODAY AT [facility]:   Refer to the procedure report that was given to you for any specific questions about what was found during the examination.  If the procedure report does not answer your questions, please call your gastroenterologist to clarify.  If you requested that your care partner not be given the details of your procedure findings, then the procedure report has been included in a sealed envelope for you to review at your convenience later.  YOU SHOULD EXPECT: Some feelings of bloating in the abdomen. Passage of more gas than usual.  Walking can help get rid of the air that was put into your GI tract during the procedure and reduce the bloating. If you had a lower endoscopy (such as a colonoscopy or flexible sigmoidoscopy) you may notice spotting of blood in your stool or on the toilet paper. If you underwent a bowel prep for your procedure, you may not have a normal bowel movement for a few days.  Please Note:  You might notice some irritation and congestion in your nose or some drainage.  This is from the oxygen used during your procedure.  There is no need for concern and it should clear up in a day or so.  SYMPTOMS TO REPORT IMMEDIATELY:   Following upper endoscopy (EGD)  Vomiting of blood or coffee ground material  New chest pain or pain under the shoulder blades  Painful or persistently difficult swallowing  New shortness of breath  Fever of 100F or higher  Black, tarry-looking stools  For urgent or emergent issues, a gastroenterologist can be reached at any hour by calling [phone]. Do not use MyChart messaging for urgent concerns.    DIET:   Nothing by mouth until 11:23 a.m. today, then you may progress to clear liquids only for one hour.  You may have a soft diet for the remainder of the day.  Progress to regular diet tomorrow.    Drink  plenty of fluids but you should avoid alcoholic beverages for 24 hours.  ACTIVITY:  You should plan to take it easy for the rest of today and you should NOT DRIVE or use heavy machinery until tomorrow (because of the sedation medicines used during the test).    FOLLOW UP: Our staff will call the number listed on your records 48-72 hours following your procedure to check on you and address any questions or concerns that you may have regarding the information given to you following your procedure. If we do not reach you, we will leave a message.  We will attempt to reach you two times.  During this call, we will ask if you have developed any symptoms of COVID 19. If you develop any symptoms (ie: fever, flu-like symptoms, shortness of breath, cough etc.) before then, please call [phone].  If you test positive for Covid 19 in the 2 weeks post procedure, please call and report this information to [place].    If any biopsies were taken you will be contacted by phone or by letter within the next 1-3 weeks.  Please call us at [phone] if you have not heard about the biopsies in 3 weeks.    SIGNATURES/CONFIDENTIALITY: You and/or your care partner have signed paperwork which will be entered into your electronic medical record.  These signatures attest to the fact that that the information above on your After Visit Summary  has been reviewed and is understood.  Full responsibility of the confidentiality of this discharge information lies with you and/or your care-partner.

## 2021-10-21 ENCOUNTER — Telehealth: Payer: Self-pay | Admitting: *Deleted

## 2021-10-21 NOTE — Telephone Encounter (Signed)
°  Follow up Call-  Call back number 10/17/2021 04/22/2020  Post procedure Call Back phone  # [phone]  Permission to leave phone message Yes Yes  Some recent data might be hidden     Patient questions:  Do you have a fever, pain , or abdominal swelling? No. Pain Score  0 *  Have you tolerated food without any problems? Yes.    Have you been able to return to your normal activities? Yes.    Do you have any questions about your discharge instructions: Diet   No. Medications  No. Follow up visit  No.  Do you have questions or concerns about your Care? No.  Actions: * If pain score is 4 or above: No action needed, pain <4.  Have you developed a fever since your procedure? no  2.   Have you had an respiratory symptoms (SOB or cough) since your procedure? no  3.   Have you tested positive for COVID 19 since your procedure no  4.   Have you had any family members/close contacts diagnosed with the COVID 19 since your procedure?  no   If yes to any of these questions please route to Joylene John, RN and Joella Prince, RN

## 2021-10-22 ENCOUNTER — Encounter: Payer: Self-pay | Admitting: Gastroenterology

## 2021-11-29 IMAGING — CT CT NECK W/ CM
4 series · 16 of 35 positions shown, 19 images · IV contrast (OMNIPAQUE)
Comparison: Upper GI 01/22/2020.  Ultrasound thyroid 03/20/2020

CLINICAL DATA: Cervical lymphadenopathy.  Dysphagia.

EXAM:
CT NECK WITH CONTRAST
TECHNIQUE: Multidetector CT imaging of the neck was performed using the
standard protocol following the bolus administration of intravenous
contrast.
CONTRAST:  75mL OMNIPAQUE IOHEXOL 300 MG/ML  SOLN

[Series 3: axial neck · axial · 0.43mm/px · z∈[+1503,+1541]mm · 2 of 94 slices shown]
[im 19/94  bone]
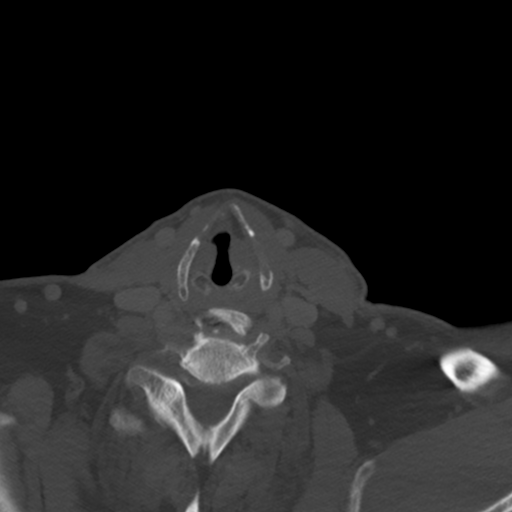
[im 38/94  bone]
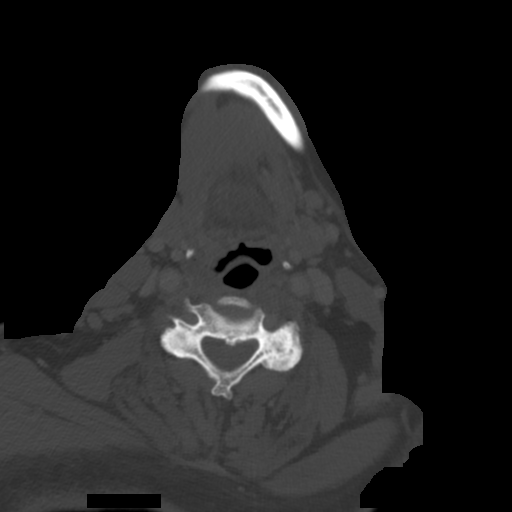

[Series 602: axial reformats · axial · 0.43mm/px · z∈[+1468,+1627]mm · 6 of 120 slices shown, 8 images]
[im 18/120  soft-tissue]
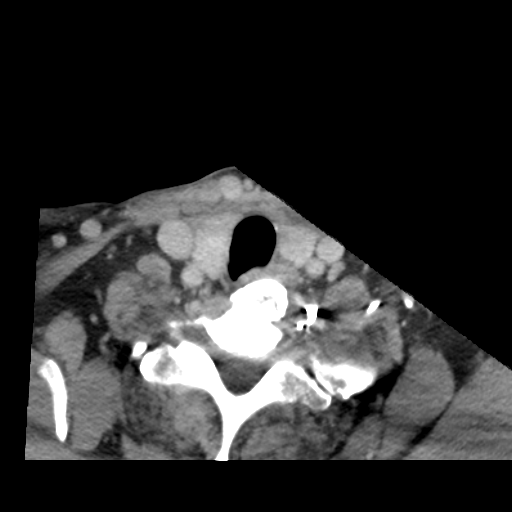
[im 18/120  bone]
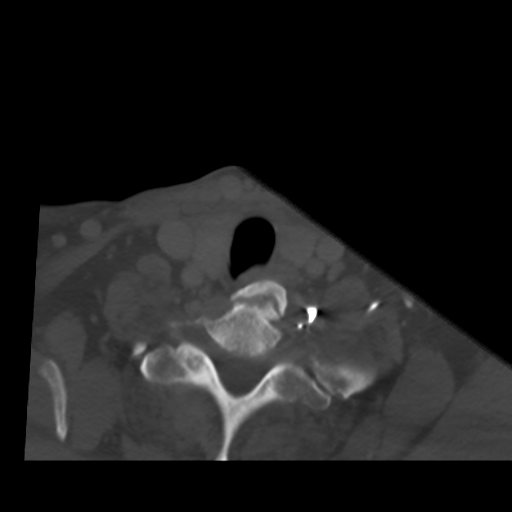
[im 35/120  bone]
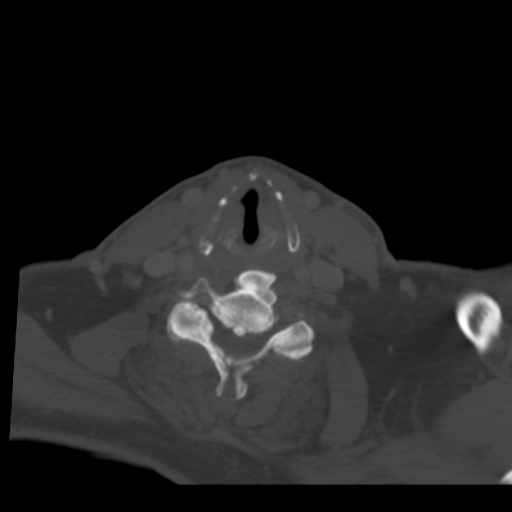
[im 52/120  bone]
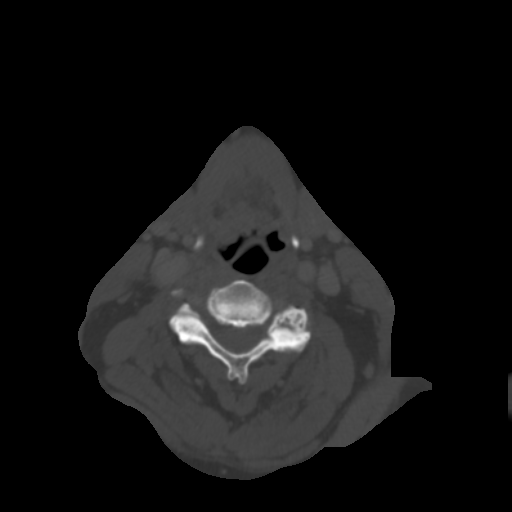
[im 69/120  bone]
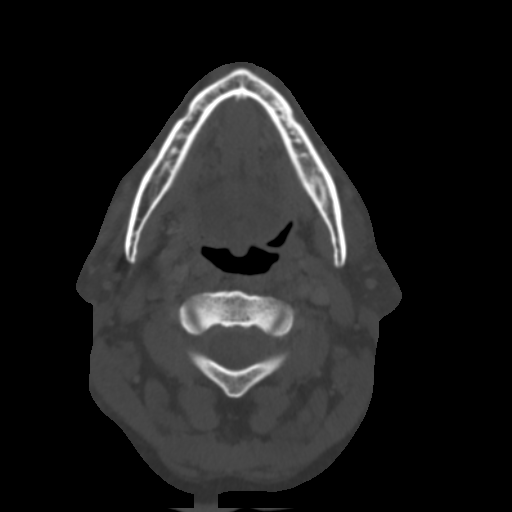
[im 86/120  soft-tissue]
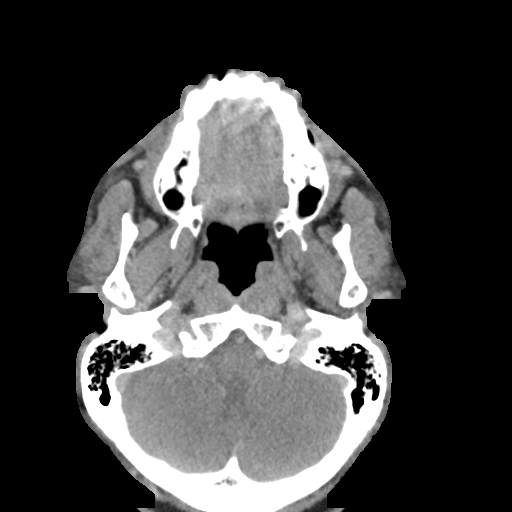
[im 86/120  bone]
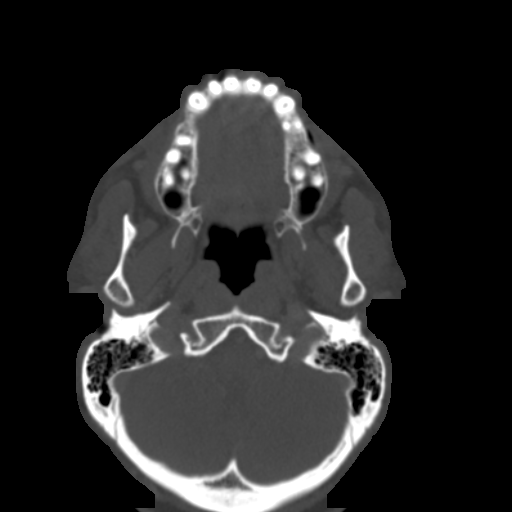
[im 103/120  bone]
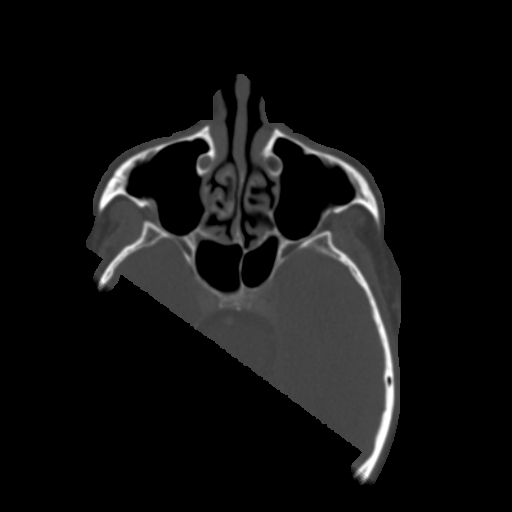

[Series 603: coronal images · coronal · 0.43mm/px · 3 of 111 slices shown]
[im 40/111  bone]
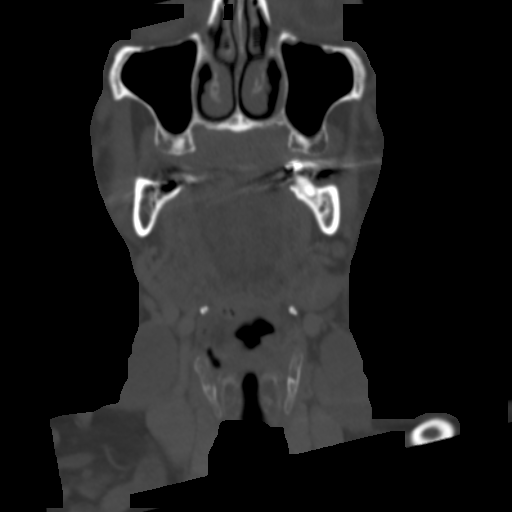
[im 50/111  bone]
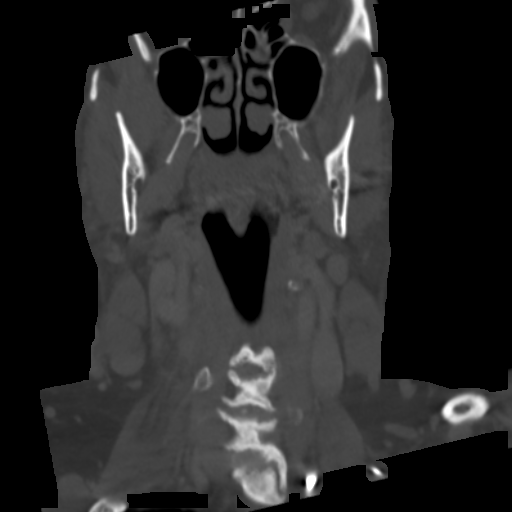
[im 61/111  bone]
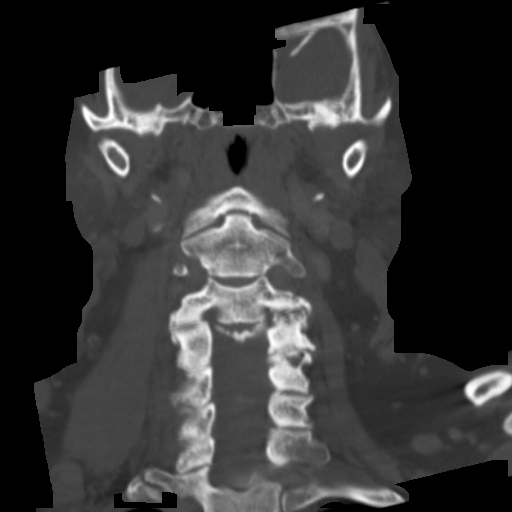

[Series 604: sagittal images · sagittal · 0.43mm/px · 5 of 90 slices shown, 6 images]
[im 30/90  bone]
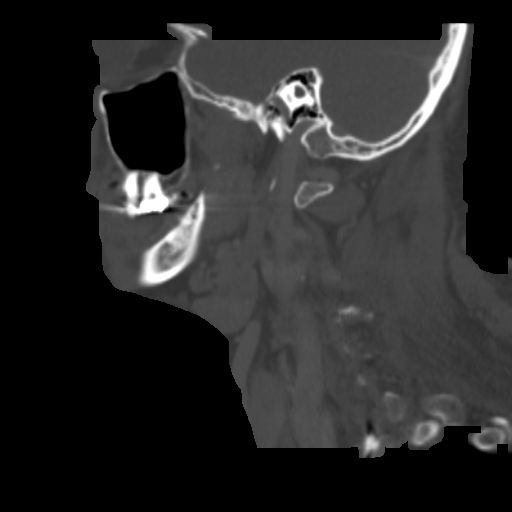
[im 38/90  bone]
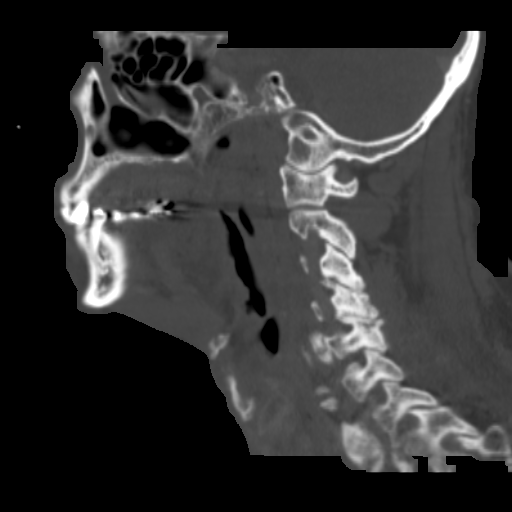
[im 45/90  soft-tissue]
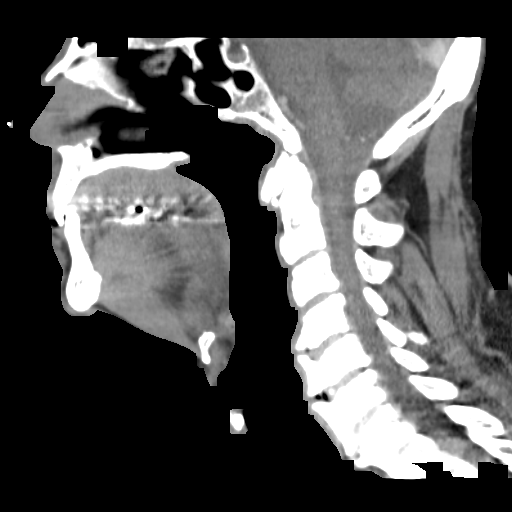
[im 45/90  bone]
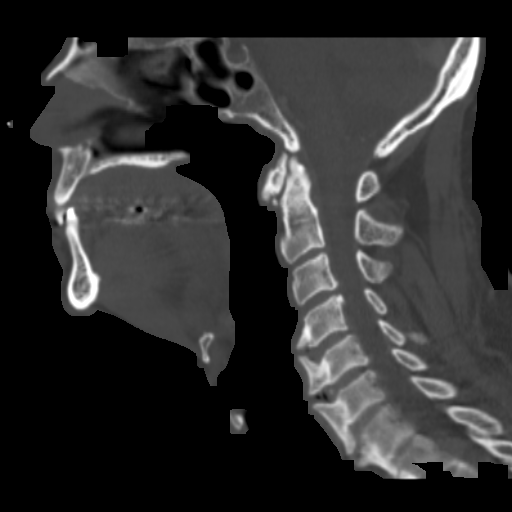
[im 52/90  bone]
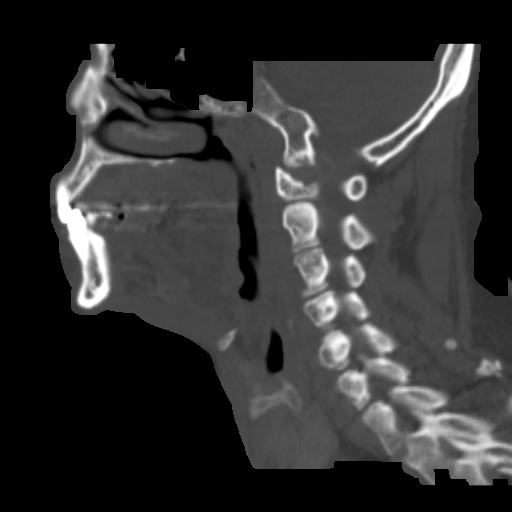
[im 60/90  bone]
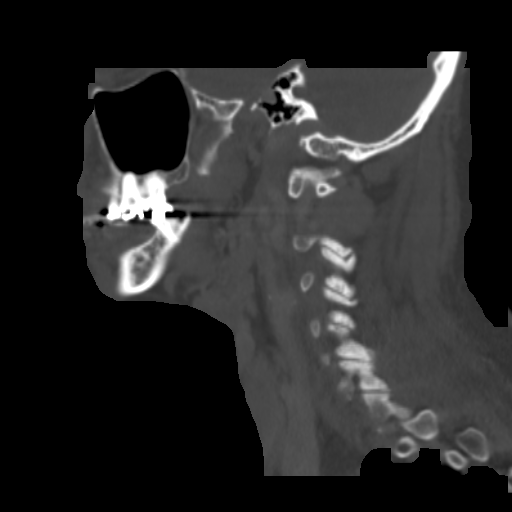

[16 of 35 positions shown; findings below may reference images not displayed]

FINDINGS: Pharynx and larynx: Normal. No mass or swelling.

The proximal esophagus deviates to the left due to large cervical
anterior osteophytes. This explains the upper GI finding. No soft
tissue mass.

Salivary glands: No inflammation, mass, or stone.

Thyroid: Thyroid normal in size. Several tiny nodules are present.
No further evaluation necessary.

Lymph nodes: No pathologic adenopathy in the neck.

8 mm right level 5 lymph node posteriorly in the mid neck.
Additional 6 mm posterior lymph node on the right.

Left level 2 lymph node 8.7 mm. 5 mm level 3 lymph node. Small
posterior lymph nodes on the left.

Vascular: Normal vascular enhancement.

Limited intracranial: Negative

Visualized orbits: Negative

Mastoids and visualized paranasal sinuses: Minimal mucosal edema
paranasal sinuses. Mastoid clear bilaterally.

Skeleton: No acute skeletal abnormality. Left-sided facet
degeneration C3-4 and C4-5. Large anterior osteophytes C3 through
C7. No acute skeletal abnormality.

Upper chest: Lung apices clear bilaterally.

Other: None
IMPRESSION: Negative for mass.  No pathologic adenopathy in neck

Large anterior osteophytes in the cervical spine is placed the
proximal esophagus to left, explaining the upper GI finding.

## 2021-12-29 ENCOUNTER — Encounter: Payer: Self-pay | Admitting: Emergency Medicine

## 2021-12-29 ENCOUNTER — Ambulatory Visit (INDEPENDENT_AMBULATORY_CARE_PROVIDER_SITE_OTHER): Payer: Medicare Other | Admitting: Emergency Medicine

## 2021-12-29 ENCOUNTER — Other Ambulatory Visit: Payer: Self-pay

## 2021-12-29 VITALS — BP 128/84 | HR 73 | Temp 97.8°F | Ht 67.0 in | Wt 238.1 lb

## 2021-12-29 DIAGNOSIS — R635 Abnormal weight gain: Secondary | ICD-10-CM

## 2021-12-29 DIAGNOSIS — I10 Essential (primary) hypertension: Secondary | ICD-10-CM | POA: Diagnosis not present

## 2021-12-29 DIAGNOSIS — Z7689 Persons encountering health services in other specified circumstances: Secondary | ICD-10-CM

## 2021-12-29 DIAGNOSIS — G40909 Epilepsy, unspecified, not intractable, without status epilepticus: Secondary | ICD-10-CM | POA: Diagnosis not present

## 2021-12-29 DIAGNOSIS — I872 Venous insufficiency (chronic) (peripheral): Secondary | ICD-10-CM

## 2021-12-29 DIAGNOSIS — E785 Hyperlipidemia, unspecified: Secondary | ICD-10-CM | POA: Diagnosis not present

## 2021-12-29 LAB — CBC WITH DIFFERENTIAL/PLATELET
Basophils Absolute: 0 10*3/uL (ref 0.0–0.1)
Basophils Relative: 0.3 % (ref 0.0–3.0)
Eosinophils Absolute: 0.5 10*3/uL (ref 0.0–0.7)
Eosinophils Relative: 8.9 % — ABNORMAL HIGH (ref 0.0–5.0)
HCT: 39 % (ref 39.0–52.0)
Hemoglobin: 12.8 g/dL — ABNORMAL LOW (ref 13.0–17.0)
Lymphocytes Relative: 24.4 % (ref 12.0–46.0)
Lymphs Abs: 1.3 10*3/uL (ref 0.7–4.0)
MCHC: 32.7 g/dL (ref 30.0–36.0)
MCV: 94 fl (ref 78.0–100.0)
Monocytes Absolute: 0.5 10*3/uL (ref 0.1–1.0)
Monocytes Relative: 8.8 % (ref 3.0–12.0)
Neutro Abs: 3 10*3/uL (ref 1.4–7.7)
Neutrophils Relative %: 57.6 % (ref 43.0–77.0)
Platelets: 201 10*3/uL (ref 150.0–400.0)
RBC: 4.15 Mil/uL — ABNORMAL LOW (ref 4.22–5.81)
RDW: 14.1 % (ref 11.5–15.5)
WBC: 5.3 10*3/uL (ref 4.0–10.5)

## 2021-12-29 LAB — COMPREHENSIVE METABOLIC PANEL
ALT: 13 U/L (ref 0–53)
AST: 19 U/L (ref 0–37)
Albumin: 4.1 g/dL (ref 3.5–5.2)
Alkaline Phosphatase: 130 U/L — ABNORMAL HIGH (ref 39–117)
BUN: 19 mg/dL (ref 6–23)
CO2: 29 mEq/L (ref 19–32)
Calcium: 9.6 mg/dL (ref 8.4–10.5)
Chloride: 102 mEq/L (ref 96–112)
Creatinine, Ser: 1.81 mg/dL — ABNORMAL HIGH (ref 0.40–1.50)
GFR: 37.26 mL/min — ABNORMAL LOW (ref >60.00)
Glucose, Bld: 104 mg/dL — ABNORMAL HIGH (ref 70–99)
Potassium: 3.6 mEq/L (ref 3.5–5.1)
Sodium: 139 mEq/L (ref 135–145)
Total Bilirubin: 0.5 mg/dL (ref 0.2–1.2)
Total Protein: 7.5 g/dL (ref 6.0–8.3)

## 2021-12-29 LAB — LIPID PANEL
Cholesterol: 154 mg/dL (ref 0–200)
HDL: 28.6 mg/dL — ABNORMAL LOW (ref >39.00)
NonHDL: 125.33
Total CHOL/HDL Ratio: 5
Triglycerides: 354 mg/dL — ABNORMAL HIGH (ref 0.0–149.0)
VLDL: 70.8 mg/dL — ABNORMAL HIGH (ref 0.0–40.0)

## 2021-12-29 LAB — HEMOGLOBIN A1C: Hgb A1c MFr Bld: 6.5 % (ref 4.6–6.5)

## 2021-12-29 LAB — LDL CHOLESTEROL, DIRECT: Direct LDL: 69 mg/dL

## 2021-12-29 NOTE — Progress Notes (Signed)
Wesley Mccarthy ?71 y.o. ? ? ?Chief Complaint  ?Patient presents with  ? New Patient (Initial Visit)  ? weight concern  ?  Weight gain concern  ? Foot Swelling  ?  Ankles swollen  ?  ? ? ?HISTORY OF PRESENT ILLNESS: ?This is a 71 y.o. male first visit to this office, here to establish care with me. ?Has history of hypertension, seizure disorder, dyslipidemia, GERD with esophageal problems needing surgery in the past, possible hypertensive heart disease. ?Today has a couple of concerns including weight gain, ankle swelling, occasional dyspnea on exertion. ?Patient eating more and not exercising regularly since he retired. ?Denies chest pain on exertion.  No history of angina or ischemic heart disease. ?No other complaints or medical concerns today. ? ?HPI ? ? ?Prior to Admission medications   ?Medication Sig Start Date End Date Taking? Authorizing Provider  ?allopurinol (ZYLOPRIM) 300 MG tablet TAKE ONE TABLET BY MOUTH DAILY 09/25/16  Yes Stallings, Zoe A, MD  ?furosemide (LASIX) 20 MG tablet Take 20 mg by mouth daily.   Yes [provider]  ?hydrochlorothiazide (HYDRODIURIL) 25 MG tablet TAKE 1 TABLET (25 MG TOTAL) BY MOUTH DAILY. 03/19/16  Yes Ezekiel Slocumb, PA-C  ?lisinopril (PRINIVIL,ZESTRIL) 40 MG tablet TAKE ONE TABLET BY MOUTH DAILY 03/04/17  Yes Delia Chimes A, MD  ?omeprazole (PRILOSEC) 20 MG capsule Take 1 capsule (20 mg total) by mouth daily. Please keep your appointment this month for further refills. Thank you 09/03/21  Yes Noralyn Pick, NP  ?PHENobarbital (LUMINAL) 64.8 MG tablet TAKE ONE AND ONE-HALF (1 & 1/2) TABLET BY MOUTH AT BEDTIME 10/01/17  Yes Penumalli, Vikram R, MD  ?Potassium Chloride ER 20 MEQ TBCR TAKE 1 TABLETS BY MOUTH DAILY TO TWICE DAILY AS DIRECTED. 06/27/16  Yes English, Stephanie D, PA  ?pravastatin (PRAVACHOL) 40 MG tablet TAKE 1 TABLET (40 MG TOTAL) BY MOUTH DAILY. 07/07/16  Yes Jeffery, Domingo Mend, PA  ?polyethylene glycol powder (GLYCOLAX/MIRALAX) powder Take 17 g by  mouth 2 (two) times daily as needed. ?Patient not taking: Reported on 12/29/2021 11/22/17   Forrest Moron, MD  ? ? ?Allergies  ?Allergen Reactions  ? Norvasc [Amlodipine Besylate]   ?  Per patient caused his liver to shutdown.  ? Penicillins   ? ? ?Patient Active Problem List  ? Diagnosis Date Noted  ? Anemia 08/22/2013  ? 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 Stasis dermatitis of both legs 12/21/2012  ? Edema 12/21/2012  ? Seizure disorder ([place]) 08/26/2012  ? HTN (hypertension) 11/11/2011  ? Hyperlipemia 11/11/2011  ? Gout 11/11/2011  ? Obesity 11/11/2011  ? ? ?Past Medical History:  ?Diagnosis Date  ? Allergy   ? Arthritis   ? CKD (chronic kidney disease) stage 3, GFR 30-59 ml/min (HCC)   ? Epilepsy, unspecified, not intractable, without status epilepticus ([place])   ? Esophageal obstruction   ? GERD (gastroesophageal reflux disease)   ? Hyperlipidemia   ? Hypertension   ? Osteoarthritis 2018  ? knee  ? Seizures ([place])   ? ? ?Past Surgical History:  ?Procedure Laterality Date  ? APPENDECTOMY    ? TONSILLECTOMY    ? ? ?Social History  ? ?Socioeconomic History  ? Marital status: Married  ?  Spouse name: Not on file  ? Number of children: 3  ? Years of education: Not on file  ? Highest education level: Not on file  ?Occupational History  ? Occupation: [employer]  ? Occupation: [employer]  ?Tobacco Use  ? Smoking  status: Never  ? Smokeless tobacco: Never  ?Vaping Use  ? Vaping Use: Never used  ?Substance and Sexual Activity  ? Alcohol use: Yes  ?  Comment: 1-2 per year  ? Drug use: No  ? Sexual activity: Yes  ?  Birth control/protection: None  ?Other Topics Concern  ? Not on file  ?Social History Narrative  ? Married. Education: [employer].   ? ?Social Determinants of Health  ? ?Financial Resource Strain: Not on file  ?Food Insecurity: Not on file  ?Transportation Needs: Not on file  ?Physical Activity: Not on file  ?Stress: Not on file  ?Social Connections: Not on file  ?Intimate Partner Violence: Not on file   ? ? ?Family History  ?Problem Relation Age of Onset  ? Heart disease Mother   ?     Had a CABG  ? Colon cancer Father   ? Seizures Daughter   ? Diabetes Paternal Aunt   ? Stomach cancer Neg Hx   ? Esophageal cancer Neg Hx   ? Rectal cancer Neg Hx   ? Liver cancer Neg Hx   ? ? ? ?Review of Systems  ?Constitutional: Negative.  Negative for chills and fever.  ?HENT: Negative.  Negative for congestion and sore throat.   ?Respiratory: Negative.  Negative for cough and shortness of breath.   ?Cardiovascular: Negative.  Negative for chest pain and palpitations.  ?Gastrointestinal:  Negative for abdominal pain, nausea and vomiting.  ?Genitourinary: Negative.   ?Skin: Negative.  Negative for rash.  ?Neurological:  Negative for dizziness and headaches.  ?All other systems reviewed and are negative. ? ?Today's Vitals  ? 12/29/21 1030  ?BP: 128/84  ?Pulse: 73  ?Temp: 97.8 ?F (36.6 ?C)  ?TempSrc: Oral  ?SpO2: 97%  ?Weight: 238 lb 2 oz (108 kg)  ?Height: '5\' 7"'$  (1.702 m)  ? ?Body mass index is 37.3 kg/m?. ?Wt Readings from Last 3 Encounters:  ?12/29/21 238 lb 2 oz (108 kg)  ?10/17/21 232 lb (105.2 kg)  ?09/03/21 232 lb 2 oz (105.3 kg)  ? ? ?Physical Exam ?Vitals reviewed.  ?Constitutional:   ?   Appearance: Normal appearance.  ?HENT:  ?   Head: Normocephalic.  ?   Mouth/Throat:  ?   Mouth: Mucous membranes are moist.  ?   Pharynx: Oropharynx is clear.  ?Eyes:  ?   Extraocular Movements: Extraocular movements intact.  ?   Pupils: Pupils are equal, round, and reactive to light.  ?Cardiovascular:  ?   Rate and Rhythm: Normal rate and regular rhythm.  ?   Pulses: Normal pulses.  ?   Heart sounds: Normal heart sounds.  ?Pulmonary:  ?   Effort: Pulmonary effort is normal.  ?   Breath sounds: Normal breath sounds.  ?Abdominal:  ?   Palpations: Abdomen is soft.  ?   Tenderness: There is no abdominal tenderness.  ?Musculoskeletal:     ?   General: Normal range of motion.  ?   Cervical back: No tenderness.  ?   Comments: Pitting edema  lower extremities +1  ?Lymphadenopathy:  ?   Cervical: No cervical adenopathy.  ?Skin: ?   General: Skin is warm and dry.  ?   Capillary Refill: Capillary refill takes less than 2 seconds.  ?Neurological:  ?   General: No focal deficit present.  ?   Mental Status: He is alert and oriented to person, place, and time.  ?Psychiatric:     ?   Mood and Affect: Mood normal.     ?  Behavior: Behavior normal.  ? ? ? ?ASSESSMENT & PLAN: ?A total of 50-minute was spent with the patient and counseling/coordination of care regarding preparing for this visit, review of available medical records, review of multiple chronic medical problems under management, review of all medications, education on nutrition and weight gain problem, possibility of hypertensive heart disease and cardiovascular risks associated with this condition, comprehensive history and physical examination, prognosis, documentation and need for follow-up. ? ?Problem List Items Addressed This Visit   ? ?  ? Cardiovascular and Mediastinum  ? Essential hypertension - Primary  ?  Well-controlled hypertension. ?BP Readings from Last 3 Encounters:  ?12/29/21 128/84  ?10/17/21 115/69  ?09/03/21 124/80  ?Continue lisinopril 40 mg and hydrochlorothiazide 25 mg daily. ? ?  ?  ? Relevant Orders  ? CBC with Differential/Platelet  ? Comprehensive metabolic panel  ? Hemoglobin A1c  ?  ? Nervous and Auditory  ? Seizure disorder ([place])  ?  Seizure disorder since he was a kid.  Continue phenobarbital at bedtime. ?  ?  ?  ? Musculoskeletal and Integument  ? Stasis dermatitis of both legs  ?  Contributing to lower leg edema.  Continue Lasix 20 mg daily. ?  ?  ?  ? Other  ? Dyslipidemia  ?  Stable.  Continue pravastatin 40 mg daily. ?  ?  ? Relevant Orders  ? CBC with Differential/Platelet  ? Hemoglobin A1c  ? Lipid panel  ? Weight gain  ?  Ingesting more calories and exercising less since he retired. ?Diet and nutrition discussed. ?  ?  ? Relevant Orders  ? CBC with  Differential/Platelet  ? TSH  ? ?Other Visit Diagnoses   ? ? Encounter to establish care      ? ?  ? ?Patient Instructions  ?Health Maintenance After Age 46 ?After age 5, you are at a higher risk for certain long-term diseases

## 2021-12-29 NOTE — Assessment & Plan Note (Signed)
Contributing to lower leg edema.  Continue Lasix 20 mg daily. ?

## 2021-12-29 NOTE — Assessment & Plan Note (Signed)
Stable.  Continue pravastatin 40 mg daily. 

## 2021-12-29 NOTE — Assessment & Plan Note (Signed)
Well-controlled hypertension. ?BP Readings from Last 3 Encounters:  ?12/29/21 128/84  ?10/17/21 115/69  ?09/03/21 124/80  ?Continue lisinopril 40 mg and hydrochlorothiazide 25 mg daily. ? ?

## 2021-12-29 NOTE — Patient Instructions (Signed)
Health Maintenance After Age 71 After age 71, you are at a higher risk for certain long-term diseases and infections as well as injuries from falls. Falls are a major cause of broken bones and head injuries in people who are older than age 71. Getting regular preventive care can help to keep you healthy and well. Preventive care includes getting regular testing and making lifestyle changes as recommended by your health care provider. Talk with your health care provider about: Which screenings and tests you should have. A screening is a test that checks for a disease when you have no symptoms. A diet and exercise plan that is right for you. What should I know about screenings and tests to prevent falls? Screening and testing are the best ways to find a health problem early. Early diagnosis and treatment give you the best chance of managing medical conditions that are common after age 71. Certain conditions and lifestyle choices may make you more likely to have a fall. Your health care provider may recommend: Regular vision checks. Poor vision and conditions such as cataracts can make you more likely to have a fall. If you wear glasses, make sure to get your prescription updated if your vision changes. Medicine review. Work with your health care provider to regularly review all of the medicines you are taking, including over-the-counter medicines. Ask your health care provider about any side effects that may make you more likely to have a fall. Tell your health care provider if any medicines that you take make you feel dizzy or sleepy. Strength and balance checks. Your health care provider may recommend certain tests to check your strength and balance while standing, walking, or changing positions. Foot health exam. Foot pain and numbness, as well as not wearing proper footwear, can make you more likely to have a fall. Screenings, including: Osteoporosis screening. Osteoporosis is a condition that causes  the bones to get weaker and break more easily. Blood pressure screening. Blood pressure changes and medicines to control blood pressure can make you feel dizzy. Depression screening. You may be more likely to have a fall if you have a fear of falling, feel depressed, or feel unable to do activities that you used to do. Alcohol use screening. Using too much alcohol can affect your balance and may make you more likely to have a fall. Follow these instructions at home: Lifestyle Do not drink alcohol if: Your health care provider tells you not to drink. If you drink alcohol: Limit how much you have to: 0-1 drink a day for women. 0-2 drinks a day for men. Know how much alcohol is in your drink. In the [place] one drink equals one 12 oz bottle of beer (355 mL), one 5 oz glass of wine (148 mL), or one 1 oz glass of hard liquor (44 mL). Do not use any products that contain nicotine or tobacco. These products include cigarettes, chewing tobacco, and vaping devices, such as e-cigarettes. If you need help quitting, ask your health care provider. Activity  Follow a regular exercise program to stay fit. This will help you maintain your balance. Ask your health care provider what types of exercise are appropriate for you. If you need a cane or walker, use it as recommended by your health care provider. Wear supportive shoes that have nonskid soles. Safety  Remove any tripping hazards, such as rugs, cords, and clutter. Install safety equipment such as grab bars in bathrooms and safety rails on stairs. Keep rooms and walkways   well-lit. General instructions Talk with your health care provider about your risks for falling. Tell your health care provider if: You fall. Be sure to tell your health care provider about all falls, even ones that seem minor. You feel dizzy, tiredness (fatigue), or off-balance. Take over-the-counter and prescription medicines only as told by your health care provider. These include  supplements. Eat a healthy diet and maintain a healthy weight. A healthy diet includes low-fat dairy products, low-fat (lean) meats, and fiber from whole grains, beans, and lots of fruits and vegetables. Stay current with your vaccines. Schedule regular health, dental, and eye exams. Summary Having a healthy lifestyle and getting preventive care can help to protect your health and wellness after age 71. Screening and testing are the best way to find a health problem early and help you avoid having a fall. Early diagnosis and treatment give you the best chance for managing medical conditions that are more common for people who are older than age 71. Falls are a major cause of broken bones and head injuries in people who are older than age 71. Take precautions to prevent a fall at home. Work with your health care provider to learn what changes you can make to improve your health and wellness and to prevent falls. This information is not intended to replace advice given to you by your health care provider. Make sure you discuss any questions you have with your health care provider. Document Revised: 02/10/2021 Document Reviewed: 02/10/2021 Elsevier Patient Education  2022 Elsevier Inc.  

## 2021-12-29 NOTE — Assessment & Plan Note (Signed)
Ingesting more calories and exercising less since he retired. ?Diet and nutrition discussed. ?

## 2021-12-29 NOTE — Assessment & Plan Note (Signed)
Seizure disorder since he was a kid.  Continue phenobarbital at bedtime. ?

## 2021-12-30 LAB — TSH: TSH: 1.82 u[IU]/mL (ref 0.35–5.50)

## 2021-12-31 ENCOUNTER — Other Ambulatory Visit: Payer: Self-pay | Admitting: Emergency Medicine

## 2021-12-31 DIAGNOSIS — E785 Hyperlipidemia, unspecified: Secondary | ICD-10-CM

## 2021-12-31 DIAGNOSIS — I10 Essential (primary) hypertension: Secondary | ICD-10-CM

## 2021-12-31 DIAGNOSIS — N1832 Chronic kidney disease, stage 3b: Secondary | ICD-10-CM

## 2021-12-31 MED ORDER — ROSUVASTATIN CALCIUM 20 MG PO TABS: 20.0000 mg | ORAL_TABLET | Freq: Every day | ORAL | 3 refills | Status: DC

## 2022-01-07 ENCOUNTER — Telehealth: Payer: Self-pay | Admitting: Pulmonary Disease

## 2022-01-07 NOTE — Telephone Encounter (Signed)
Called patient to inform him that this medication was not prescribed by Dr. Mitchel Honour. He will have to call his neurologist to get a refill  ?

## 2022-01-07 NOTE — Telephone Encounter (Signed)
1.Medication Requested: PHENobarbital (LUMINAL) 64.8 MG tablet ? ?2. Pharmacy (Name, Street, [facility]): [id number] - [place], [place] ? ?3. On Med List: Y ? ?4. Last Visit with PCP: 12-29-2021 ? ?5. Next visit date with PCP: n/a ? ?Pt requesting new rx ?Provider is not prescribing provider ?

## 2022-01-18 ENCOUNTER — Encounter: Payer: Self-pay | Admitting: Cardiovascular Disease

## 2022-01-18 NOTE — Progress Notes (Signed)
?Cardiology Office Note:   ? ?Date:  01/19/2022  ? ?ID:  Wesley Mccarthy, DOB 15-May-1951, MRN [id number] ? ?PCP:  Wesley Pollen, MD ?  ?[facility] Providers ?Cardiologist:  Wesley Mccarthy  ?  ? ?Referring MD: Wesley Mccarthy, *  ? ?Chief Complaint  ?Patient presents with  ? Hypertension  ?   ? ?  ? ? ?HPI:    January 19, 2022   ? ?Wesley Mccarthy is a 71 y.o. male with a hx of HTN, CKD, hLD,  ? ?We are asked to see him today for further evaluation and management of his hypertension and mild leg edema by Dr. Mitchel Honour. ? ?Hes had some DOE for 4-5 months ?He retired in September.  He started having shortness of breath with exertion several months after that ? ?Previously worked at [employer] .  ?Does not exercise now  ?No trouble mowing .  ?No CP or other angina symptoms  ? ?Mother had CABG  ? ?Lipids are elevated.  ?Trigs are very elevated.  ?Eats lots of cookies  ? ? ? ? ?Past Medical History:  ?Diagnosis Date  ? Allergy   ? Arthritis   ? CKD (chronic kidney disease) stage 3, GFR 30-59 ml/min (HCC)   ? Epilepsy, unspecified, not intractable, without status epilepticus ([place])   ? Esophageal obstruction   ? GERD (gastroesophageal reflux disease)   ? Hyperlipidemia   ? Hypertension   ? Osteoarthritis 2018  ? knee  ? Seizures ([place])   ? ? ?Past Surgical History:  ?Procedure Laterality Date  ? APPENDECTOMY    ? TONSILLECTOMY    ? ? ?Current Medications: ?Current Meds  ?Medication Sig  ? allopurinol (ZYLOPRIM) 300 MG tablet TAKE ONE TABLET BY MOUTH DAILY  ? furosemide (LASIX) 20 MG tablet Take 20 mg by mouth daily.  ? hydrochlorothiazide (HYDRODIURIL) 25 MG tablet TAKE 1 TABLET (25 MG TOTAL) BY MOUTH DAILY.  ? lisinopril (PRINIVIL,ZESTRIL) 40 MG tablet TAKE ONE TABLET BY MOUTH DAILY  ? omeprazole (PRILOSEC) 20 MG capsule Take 1 capsule (20 mg total) by mouth daily. Please keep your appointment this month for further refills. Thank you  ? PHENobarbital (LUMINAL) 64.8 MG tablet TAKE ONE AND ONE-HALF (1 &  1/2) TABLET BY MOUTH AT BEDTIME  ? polyethylene glycol powder (GLYCOLAX/MIRALAX) powder Take 17 g by mouth 2 (two) times daily as needed.  ? Potassium Chloride ER 20 MEQ TBCR TAKE 1 TABLETS BY MOUTH DAILY TO TWICE DAILY AS DIRECTED.  ? pravastatin (PRAVACHOL) 40 MG tablet Take 40 mg by mouth daily.  ?  ? ?Allergies:   Norvasc [amlodipine besylate] and Penicillins  ? ?Social History  ? ?Socioeconomic History  ? Marital status: Married  ?  Spouse name: Not on file  ? Number of children: 3  ? Years of education: Not on file  ? Highest education level: Not on file  ?Occupational History  ? Occupation: Wesley Mccarthy  ? Occupation:  [employer]  ?Tobacco Use  ? Smoking status: Never  ? Smokeless tobacco: Never  ?Vaping Use  ? Vaping Use: Never used  ?Substance and Sexual Activity  ? Alcohol use: Yes  ?  Comment: 1-2 per year  ? Drug use: No  ? Sexual activity: Yes  ?  Birth control/protection: None  ?Other Topics Concern  ? Not on file  ?Social History Narrative  ? Married. Education: [employer].   ? ?Social Determinants of Health  ? ?Financial Resource Strain: Not on file  ?  Food Insecurity: Not on file  ?Transportation Needs: Not on file  ?Physical Activity: Not on file  ?Stress: Not on file  ?Social Connections: Not on file  ?  ? ?Family History: ?The patient's family history includes Colon cancer in his father; Diabetes in his paternal aunt; Heart disease in his mother; Seizures in his daughter. There is no history of Stomach cancer, Esophageal cancer, Rectal cancer, or Liver cancer. ? ?ROS:   ?Please see the history of present illness.    ? All other systems reviewed and are negative. ? ?EKGs/Labs/Other Studies Reviewed:   ? ?The following studies were reviewed today: ? ? ?EKG: January 19, 2022: Normal sinus rhythm with first-degree AV block.  Otherwise normal EKG. ? ?Recent Labs: ?12/29/2021: ALT 13; BUN 19; Creatinine, Ser 1.81; Hemoglobin 12.8; Platelets 201.0; Potassium 3.6; Sodium 139; TSH 1.82  ?Recent Lipid  Panel ?   ?Component Value Date/Time  ? CHOL 154 12/29/2021 1114  ? TRIG 354.0 (H) 12/29/2021 1114  ? HDL 28.60 (L) 12/29/2021 1114  ? CHOLHDL 5 12/29/2021 1114  ? VLDL 70.8 (H) 12/29/2021 1114  ? [place] 87 06/20/2015 1445  ? LDLDIRECT 69.0 12/29/2021 1114  ? ? ? ?Risk Assessment/Calculations:   ?  ? ?    ? ?Physical Exam:   ? ?VS:  BP 116/76 (BP Location: Left Arm, Patient Position: Sitting, Cuff Size: Normal)   Pulse 97   Ht '5\' 7"'$  (1.702 m)   Wt 236 lb 3.2 oz (107.1 kg)   SpO2 (!) 88%   BMI 36.99 kg/m?    ? ?Wt Readings from Last 3 Encounters:  ?01/19/22 236 lb 3.2 oz (107.1 kg)  ?12/29/21 238 lb 2 oz (108 kg)  ?10/17/21 232 lb (105.2 kg)  ?  ? ?GEN:  Well nourished, well developed in no acute distress ?HEENT: Normal ?NECK: No JVD; No carotid bruits ?LYMPHATICS: No lymphadenopathy ?CARDIAC: RRR, no murmurs, rubs, gallops ?RESPIRATORY:  Clear to auscultation without rales, wheezing or rhonchi  ?ABDOMEN: Soft, non-tender, non-distended ?MUSCULOSKELETAL:  1 + leg edema  No deformity  ?SKIN: Warm and dry ?NEUROLOGIC:  Alert and oriented x 3 ?PSYCHIATRIC:  Normal affect  ? ?ASSESSMENT:   ? ?No diagnosis found. ?PLAN:   ? ?  ? ?Shortness of breath with exertion: Wesley Mccarthy presents for further evaluation of his shortness of breath with exertion.  This might be due to generalized deconditioning.  He is not at all active since he retired from [employer] 6 months ago.  He spends most of his day watching TV and sitting around. ?I have advised him to start an exercise program where he walks 2 miles a day. ? ?We will get an echocardiogram to make sure he does not have structural heart disease.  If he does have evidence of congestive heart failure we may make some changes to his medications. ? ?2.  Leg edema: He is already on furosemide.  I think a lot of his edema is due to the fact that he sits around all day long.  I have encouraged him to work on salt restriction.  We will give him a DASH diet.  I explained to him how  regular walking will help reduce his leg edema. ? ?3.  Hyperlipidemia: Lipid levels are fairly well controlled.  He may benefit from a stronger statin.  I will defer to his primary medical doctor on this issue. ? ?   ? ?   ? ? ?Medication Adjustments/Labs and Tests Ordered: ?Current medicines are reviewed  at length with the patient today.  Concerns regarding medicines are outlined above.  ?No orders of the defined types were placed in this encounter. ? ?No orders of the defined types were placed in this encounter. ? ? ?There are no Patient Instructions on file for this visit.  ? ?Signed, ?Mertie Moores, MD  ?01/19/2022 2:48 PM    ?[place] ? ?

## 2022-01-19 ENCOUNTER — Encounter: Payer: Self-pay | Admitting: Cardiovascular Disease

## 2022-01-19 ENCOUNTER — Ambulatory Visit: Payer: Medicare Other | Admitting: Cardiovascular Disease

## 2022-01-19 VITALS — BP 116/76 | HR 97 | Ht 67.0 in | Wt 236.2 lb

## 2022-01-19 DIAGNOSIS — R0609 Other forms of dyspnea: Secondary | ICD-10-CM

## 2022-01-19 DIAGNOSIS — I10 Essential (primary) hypertension: Secondary | ICD-10-CM | POA: Diagnosis not present

## 2022-01-19 NOTE — Patient Instructions (Signed)
Medication Instructions:  ?Your physician recommends that you continue on your current medications as directed. Please refer to the Current Medication list given to you today. ? ?*If you need a refill on your cardiac medications before your next appointment, please call your pharmacy* ? ?Lab Work: ?NONE ? ?Testing/Procedures: ?Your physician has requested that you have an echocardiogram. Echocardiography is a painless test that uses sound waves to create images of your heart. It provides your doctor with information about the size and shape of your heart and how well your heart?s chambers and valves are working. This procedure takes approximately one hour. There are no restrictions for this procedure. ? ?Follow-Up: ?At [facility], you and your health needs are our priority.  As part of our continuing mission to provide you with exceptional heart care, we have created designated Provider Care Teams.  These Care Teams include your primary Cardiologist (physician) and Advanced Practice Providers (APPs -  Physician Assistants and Nurse Practitioners) who all work together to provide you with the care you need, when you need it. ? ?We recommend signing up for the patient portal called "MyChart".  Sign up information is provided on this After Visit Summary.  MyChart is used to connect with patients for Virtual Visits (Telemedicine).  Patients are able to view lab/test results, encounter notes, upcoming appointments, etc.  Non-urgent messages can be sent to your provider as well.   ?To learn more about what you can do with MyChart, go to [email or web address].   ? ?Your next appointment:   ?1 year(s) ? ?The format for your next appointment:   ?In Person ? ?Provider:   ?Mertie Moores, MD  or Robbie Lis, PA-C, Christen Bame, NP, or Richardson Dopp, PA-C      ? ?Other Instructions ?For your  leg edema you  should do  the following ?1. Leg elevation - I recommend the Lounge Dr. Leg rest.  See below for details  ?2. Salt  restriction  -  Use potassium chloride instead of regular salt as a salt substitute. ?3. Walk regularly ?4. Compression hose - Medical Supply store  ?5. Weight loss  ? ? ?Available on [email or web address] ?Or  ?Go to [email or web address] ? ? ? ?DASH Eating Plan ?DASH stands for Dietary Approaches to Stop Hypertension. The DASH eating plan is a healthy eating plan that has been shown to: ?Reduce high blood pressure (hypertension). ?Reduce your risk for type 2 diabetes, heart disease, and stroke. ?Help with weight loss. ?What are tips for following this plan? ?Reading food labels ?Check food labels for the amount of salt (sodium) per serving. Choose foods with less than 5 percent of the Daily Value of sodium. Generally, foods with less than 300 milligrams (mg) of sodium per serving fit into this eating plan. ?To find whole grains, look for the word "whole" as the first word in the ingredient list. ?Shopping ?Buy products labeled as "low-sodium" or "no salt added." ?Buy fresh foods. Avoid canned foods and pre-made or frozen meals. ?Cooking ?Avoid adding salt when cooking. Use salt-free seasonings or herbs instead of table salt or sea salt. Check with your health care provider or pharmacist before using salt substitutes. ?Do not fry foods. Cook foods using healthy methods such as baking, boiling, grilling, roasting, and broiling instead. ?Cook with heart-healthy oils, such as olive, canola, avocado, soybean, or sunflower oil. ?Meal planning ? ?Eat a balanced diet that includes: ?4 or more servings of fruits and 4 or more servings of vegetables each day. Try to fill  one-half of your plate with fruits and vegetables. ?6-8 servings of whole grains each day. ?Less than 6 oz (170 g) of lean meat, poultry, or fish each day. A 3-oz (85-g) serving of meat is about the same size as a deck of cards. One egg equals 1 oz (28 g). ?2-3 servings of low-fat dairy each day. One serving is 1 cup (237 mL). ?1 serving of nuts, seeds, or beans 5 times  each week. ?2-3 servings of heart-healthy fats. Healthy fats called omega-3 fatty acids are found in foods such as walnuts, flaxseeds, fortified milks, and eggs. These fats are also found in cold-water fish, such as sardines, salmon, and mackerel. ?Limit how much you eat of: ?Canned or prepackaged foods. ?Food that is high in trans fat, such as some fried foods. ?Food that is high in saturated fat, such as fatty meat. ?Desserts and other sweets, sugary drinks, and other foods with added sugar. ?Full-fat dairy products. ?Do not salt foods before eating. ?Do not eat more than 4 egg yolks a week. ?Try to eat at least 2 vegetarian meals a week. ?Eat more home-cooked food and less restaurant, buffet, and fast food. ?Lifestyle ?When eating at a restaurant, ask that your food be prepared with less salt or no salt, if possible. ?If you drink alcohol: ?Limit how much you use to: ?0-1 drink a day for women who are not pregnant. ?0-2 drinks a day for men. ?Be aware of how much alcohol is in your drink. In the [place] one drink equals one 12 oz bottle of beer (355 mL), one 5 oz glass of wine (148 mL), or one 1? oz glass of hard liquor (44 mL). ?General information ?Avoid eating more than 2,300 mg of salt a day. If you have hypertension, you may need to reduce your sodium intake to 1,500 mg a day. ?Work with your health care provider to maintain a healthy body weight or to lose weight. Ask what an ideal weight is for you. ?Get at least 30 minutes of exercise that causes your heart to beat faster (aerobic exercise) most days of the week. Activities may include walking, swimming, or biking. ?Work with your health care provider or dietitian to adjust your eating plan to your individual calorie needs. ?What foods should I eat? ?Fruits ?All fresh, dried, or frozen fruit. Canned fruit in natural juice (without added sugar). ?Vegetables ?Fresh or frozen vegetables (raw, steamed, roasted, or grilled). Low-sodium or reduced-sodium tomato  and vegetable juice. Low-sodium or reduced-sodium tomato sauce and tomato paste. Low-sodium or reduced-sodium canned vegetables. ?Grains ?Whole-grain or whole-wheat bread. Whole-grain or whole-wheat pasta. Brown rice. [place]. Bulgur. Whole-grain and low-sodium cereals. Pita bread. Low-fat, low-sodium crackers. Whole-wheat flour tortillas. ?Meats and other proteins ?Skinless chicken or [place]. Ground chicken or [place]. Pork with fat trimmed off. Fish and seafood. Egg whites. Dried beans, peas, or lentils. Unsalted nuts, nut butters, and seeds. Unsalted canned beans. Lean cuts of beef with fat trimmed off. Low-sodium, lean precooked or cured meat, such as sausages or meat loaves. ?Dairy ?Low-fat (1%) or fat-free (skim) milk. Reduced-fat, low-fat, or fat-free cheeses. Nonfat, low-sodium ricotta or cottage cheese. Low-fat or nonfat yogurt. Low-fat, low-sodium cheese. ?Fats and oils ?Soft margarine without trans fats. Vegetable oil. Reduced-fat, low-fat, or light mayonnaise and salad dressings (reduced-sodium). Canola, safflower, olive, avocado, soybean, and sunflower oils. Avocado. ?Seasonings and condiments ?Herbs. Spices. Seasoning mixes without salt. ?Other foods ?Unsalted popcorn and pretzels. Fat-free sweets. ?The items listed above may not be a complete list  of foods and beverages you can eat. Contact a dietitian for more information. ?What foods should I avoid? ?Fruits ?Canned fruit in a light or heavy syrup. Fried fruit. Fruit in cream or butter sauce. ?Vegetables ?Creamed or fried vegetables. Vegetables in a cheese sauce. Regular canned vegetables (not low-sodium or reduced-sodium). Regular canned tomato sauce and paste (not low-sodium or reduced-sodium). Regular tomato and vegetable juice (not low-sodium or reduced-sodium). Angie Fava. Olives. ?Grains ?Baked goods made with fat, such as croissants, muffins, or some breads. Dry pasta or rice meal packs. ?Meats and other proteins ?Fatty cuts of meat. Ribs.  Fried meat. Berniece Salines. Bologna, salami, and other precooked or cured meats, such as sausages or meat loaves. Fat from the back of a pig (fatback). Bratwurst. Salted nuts and seeds. Canned beans with added s

## 2022-01-30 ENCOUNTER — Ambulatory Visit (HOSPITAL_COMMUNITY): Payer: Medicare Other | Attending: Cardiovascular Disease

## 2022-01-30 DIAGNOSIS — R0609 Other forms of dyspnea: Secondary | ICD-10-CM

## 2022-01-30 LAB — ECHOCARDIOGRAM COMPLETE
Area-P 1/2: 4.86 cm2
Calc EF: 62.5 %
S' Lateral: 3.3 cm
Single Plane A2C EF: 63.9 %
Single Plane A4C EF: 57.8 %

## 2022-01-30 MED ORDER — PERFLUTREN LIPID MICROSPHERE
1.0000 mL | INTRAVENOUS | Status: AC | PRN
  Administered 2022-01-30: 1 mL via INTRAVENOUS

## 2022-02-26 ENCOUNTER — Other Ambulatory Visit: Payer: Self-pay | Admitting: Nurse Practitioner

## 2022-02-26 DIAGNOSIS — K2970 Gastritis, unspecified, without bleeding: Secondary | ICD-10-CM

## 2022-04-20 IMAGING — CR DG CHEST 2V
2 series · 2 of 2 positions shown · non-contrast
Comparison: 0279

CLINICAL DATA: Hypertensive heart disease

EXAM:
CHEST - 2 VIEW

[chest pa]
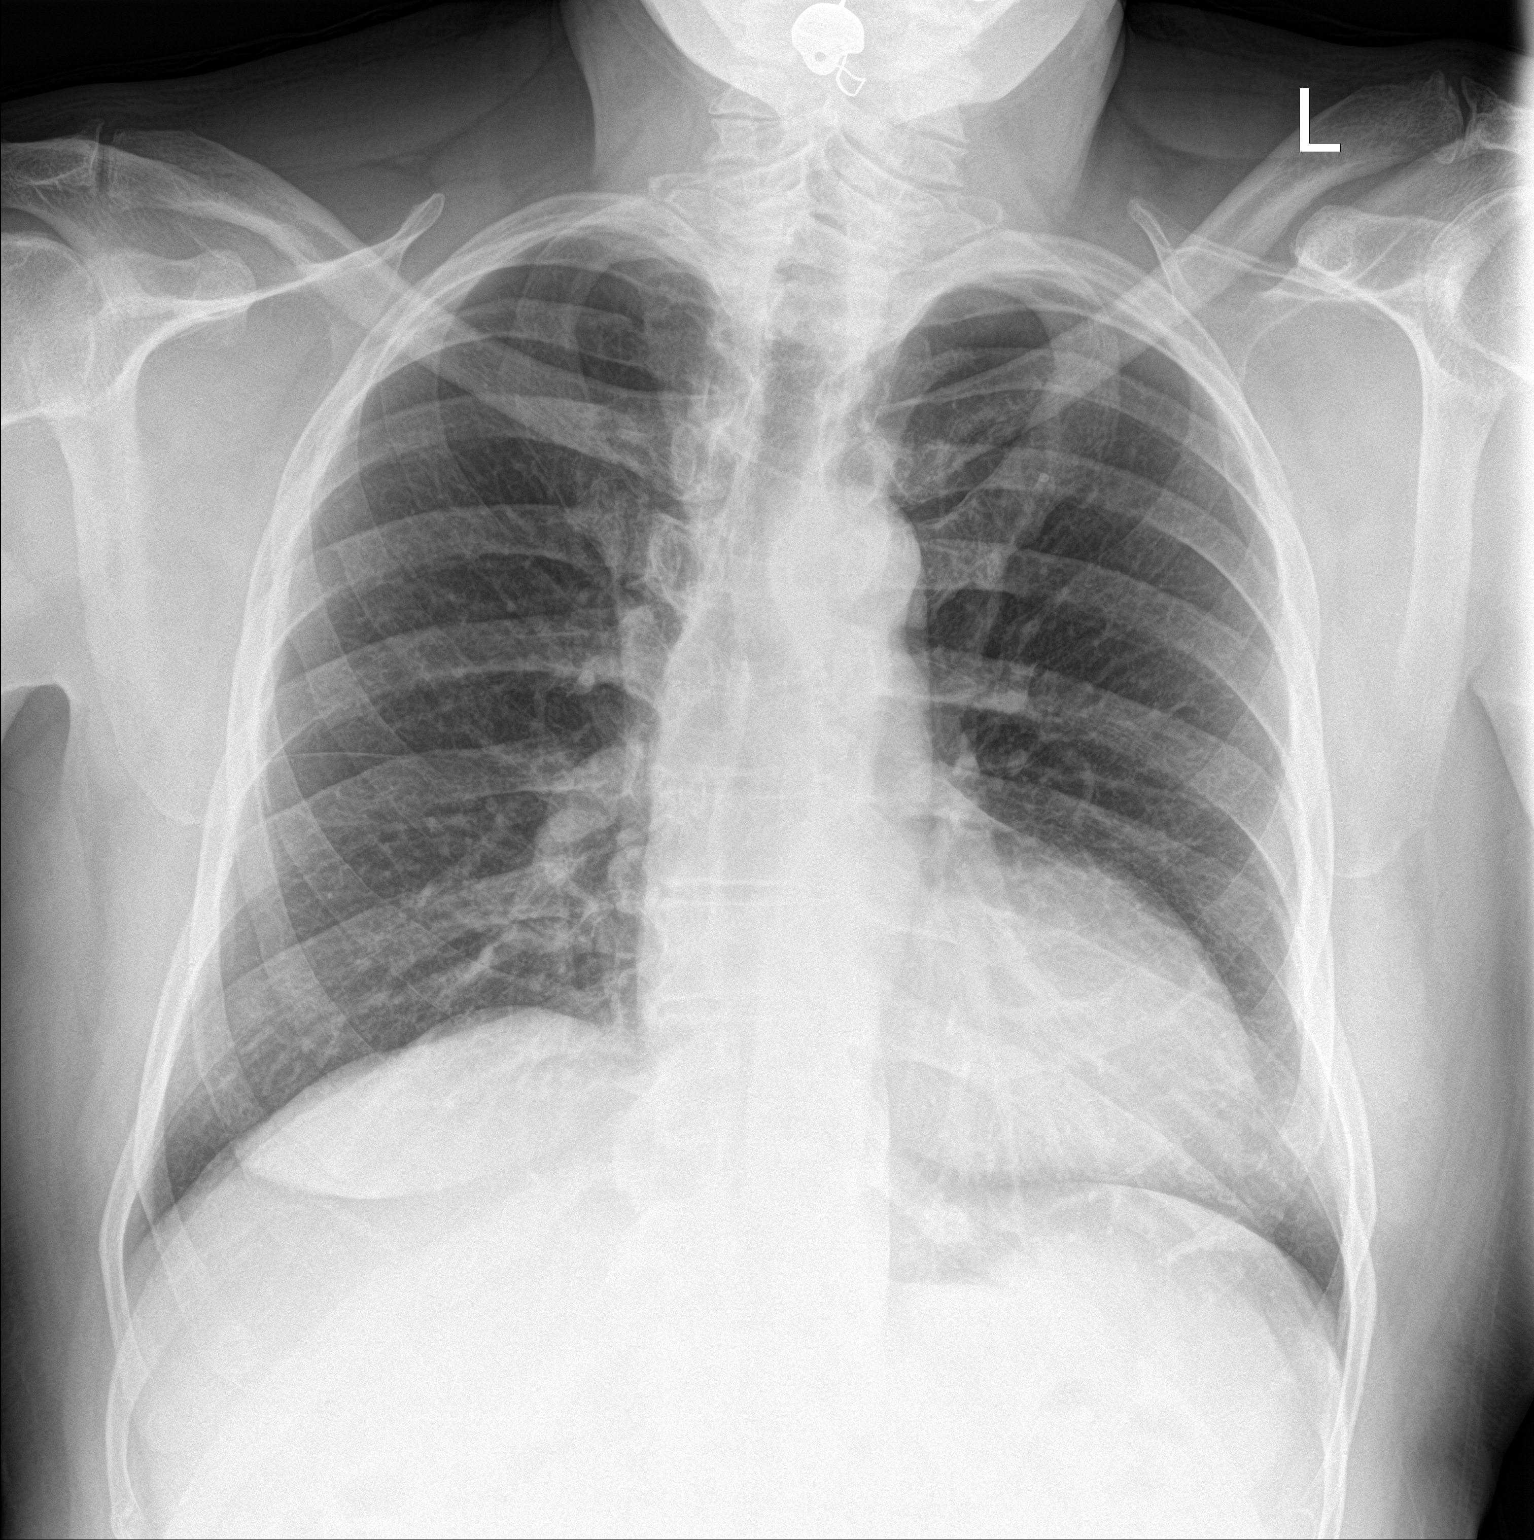

[chest lat]
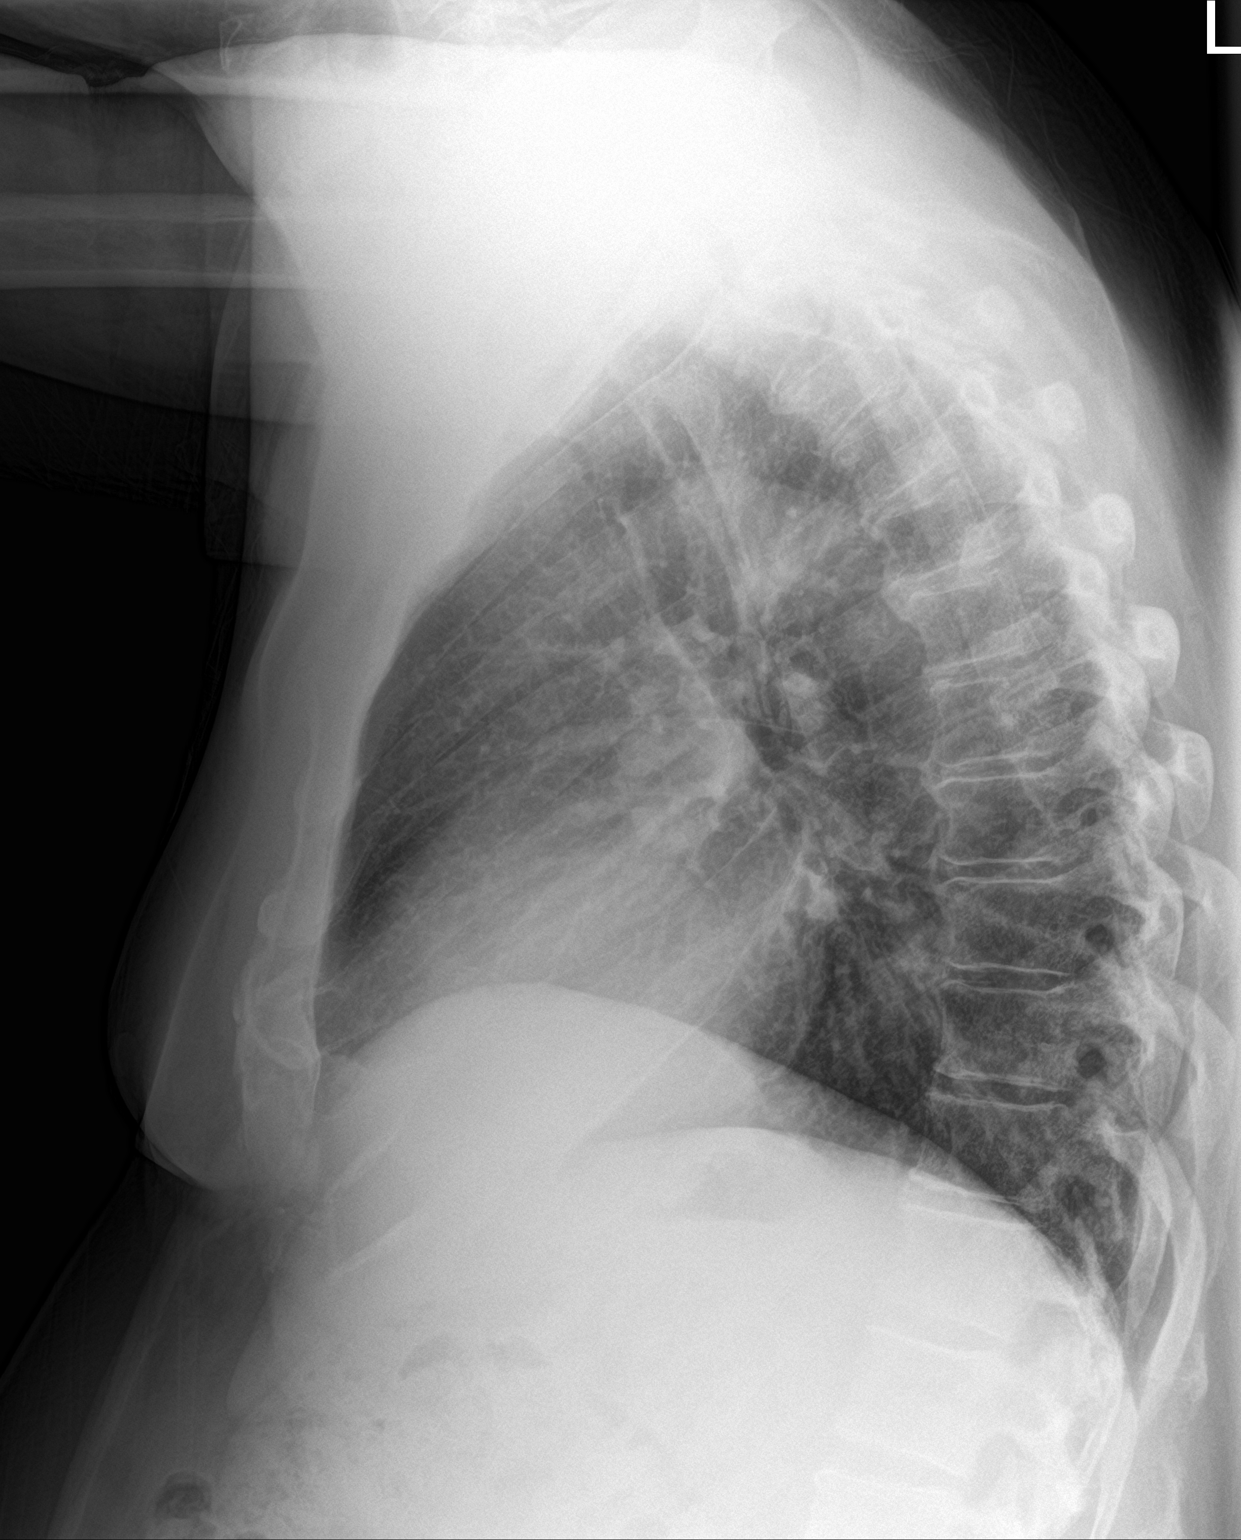

[2 of 2 positions shown; findings below may reference images not displayed]

FINDINGS: No new consolidation or edema. Stable cardiomediastinal contours
with mild cardiomegaly. No pleural effusion. No acute osseous
abnormality.
IMPRESSION: No acute process in the chest.  Stable mild cardiomegaly.

## 2022-06-30 ENCOUNTER — Ambulatory Visit (INDEPENDENT_AMBULATORY_CARE_PROVIDER_SITE_OTHER): Payer: Medicare Other

## 2022-06-30 ENCOUNTER — Ambulatory Visit (INDEPENDENT_AMBULATORY_CARE_PROVIDER_SITE_OTHER): Payer: Medicare Other | Admitting: Emergency Medicine

## 2022-06-30 ENCOUNTER — Encounter: Payer: Self-pay | Admitting: Emergency Medicine

## 2022-06-30 VITALS — BP 126/84 | HR 75 | Temp 98.1°F | Ht 67.0 in | Wt 233.2 lb

## 2022-06-30 DIAGNOSIS — R051 Acute cough: Secondary | ICD-10-CM | POA: Diagnosis not present

## 2022-06-30 DIAGNOSIS — M62830 Muscle spasm of back: Secondary | ICD-10-CM | POA: Diagnosis not present

## 2022-06-30 DIAGNOSIS — J988 Other specified respiratory disorders: Secondary | ICD-10-CM | POA: Diagnosis not present

## 2022-06-30 DIAGNOSIS — B9789 Other viral agents as the cause of diseases classified elsewhere: Secondary | ICD-10-CM | POA: Insufficient documentation

## 2022-06-30 DIAGNOSIS — Z23 Encounter for immunization: Secondary | ICD-10-CM

## 2022-06-30 NOTE — Assessment & Plan Note (Signed)
Running its course.  No red flag signs or symptoms.

## 2022-06-30 NOTE — Patient Instructions (Signed)
Health Maintenance After Age 71 After age 71, you are at a higher risk for certain long-term diseases and infections as well as injuries from falls. Falls are a major cause of broken bones and head injuries in people who are older than age 71. Getting regular preventive care can help to keep you healthy and well. Preventive care includes getting regular testing and making lifestyle changes as recommended by your health care provider. Talk with your health care provider about: Which screenings and tests you should have. A screening is a test that checks for a disease when you have no symptoms. A diet and exercise plan that is right for you. What should I know about screenings and tests to prevent falls? Screening and testing are the best ways to find a health problem early. Early diagnosis and treatment give you the best chance of managing medical conditions that are common after age 71. Certain conditions and lifestyle choices may make you more likely to have a fall. Your health care provider may recommend: Regular vision checks. Poor vision and conditions such as cataracts can make you more likely to have a fall. If you wear glasses, make sure to get your prescription updated if your vision changes. Medicine review. Work with your health care provider to regularly review all of the medicines you are taking, including over-the-counter medicines. Ask your health care provider about any side effects that may make you more likely to have a fall. Tell your health care provider if any medicines that you take make you feel dizzy or sleepy. Strength and balance checks. Your health care provider may recommend certain tests to check your strength and balance while standing, walking, or changing positions. Foot health exam. Foot pain and numbness, as well as not wearing proper footwear, can make you more likely to have a fall. Screenings, including: Osteoporosis screening. Osteoporosis is a condition that causes  the bones to get weaker and break more easily. Blood pressure screening. Blood pressure changes and medicines to control blood pressure can make you feel dizzy. Depression screening. You may be more likely to have a fall if you have a fear of falling, feel depressed, or feel unable to do activities that you used to do. Alcohol use screening. Using too much alcohol can affect your balance and may make you more likely to have a fall. Follow these instructions at home: Lifestyle Do not drink alcohol if: Your health care provider tells you not to drink. If you drink alcohol: Limit how much you have to: 0-1 drink a day for women. 0-2 drinks a day for men. Know how much alcohol is in your drink. In the [place] one drink equals one 12 oz bottle of beer (355 mL), one 5 oz glass of wine (148 mL), or one 1 oz glass of hard liquor (44 mL). Do not use any products that contain nicotine or tobacco. These products include cigarettes, chewing tobacco, and vaping devices, such as e-cigarettes. If you need help quitting, ask your health care provider. Activity  Follow a regular exercise program to stay fit. This will help you maintain your balance. Ask your health care provider what types of exercise are appropriate for you. If you need a cane or walker, use it as recommended by your health care provider. Wear supportive shoes that have nonskid soles. Safety  Remove any tripping hazards, such as rugs, cords, and clutter. Install safety equipment such as grab bars in bathrooms and safety rails on stairs. Keep rooms and walkways   well-lit. General instructions Talk with your health care provider about your risks for falling. Tell your health care provider if: You fall. Be sure to tell your health care provider about all falls, even ones that seem minor. You feel dizzy, tiredness (fatigue), or off-balance. Take over-the-counter and prescription medicines only as told by your health care provider. These include  supplements. Eat a healthy diet and maintain a healthy weight. A healthy diet includes low-fat dairy products, low-fat (lean) meats, and fiber from whole grains, beans, and lots of fruits and vegetables. Stay current with your vaccines. Schedule regular health, dental, and eye exams. Summary Having a healthy lifestyle and getting preventive care can help to protect your health and wellness after age 71. Screening and testing are the best way to find a health problem early and help you avoid having a fall. Early diagnosis and treatment give you the best chance for managing medical conditions that are more common for people who are older than age 71. Falls are a major cause of broken bones and head injuries in people who are older than age 71. Take precautions to prevent a fall at home. Work with your health care provider to learn what changes you can make to improve your health and wellness and to prevent falls. This information is not intended to replace advice given to you by your health care provider. Make sure you discuss any questions you have with your health care provider. Document Revised: 02/10/2021 Document Reviewed: 02/10/2021 Elsevier Patient Education  2023 [employer]  

## 2022-06-30 NOTE — Progress Notes (Signed)
Wesley Mccarthy 71 y.o.   Chief Complaint  Patient presents with   Back Pain    Back spasm since last Friday   Cough    Cough since last Friday    HISTORY OF PRESENT ILLNESS: This is a 72 y.o. male complaining of lumbar back spasms that started last Friday but now much improved. Also developed a dry cough last Friday which also is much improved. No other complaints or medical concerns today.  Back Pain Pertinent negatives include no abdominal pain, fever or headaches.  Cough Pertinent negatives include no chills, fever, headaches or sore throat.    Prior to Admission medications   Medication Sig Start Date End Date Taking? Authorizing Provider  allopurinol (ZYLOPRIM) 300 MG tablet TAKE ONE TABLET BY MOUTH DAILY 09/25/16  Yes Stallings, Zoe A, MD  furosemide (LASIX) 20 MG tablet Take 20 mg by mouth daily.   Yes [provider]  hydrochlorothiazide (HYDRODIURIL) 25 MG tablet TAKE 1 TABLET (25 MG TOTAL) BY MOUTH DAILY. 03/19/16  Yes Ezekiel Slocumb, PA-C  lisinopril (PRINIVIL,ZESTRIL) 40 MG tablet TAKE ONE TABLET BY MOUTH DAILY 03/04/17  Yes Stallings, Zoe A, MD  omeprazole (PRILOSEC) 20 MG capsule TAKE ONE CAPSULE BY MOUTH DAILY 02/26/22  Yes Kennedy-Smith, Patrecia Pour, NP  PHENobarbital (LUMINAL) 64.8 MG tablet TAKE ONE AND ONE-HALF (1 & 1/2) TABLET BY MOUTH AT BEDTIME 10/01/17  Yes Penumalli, Vikram R, MD  polyethylene glycol powder (GLYCOLAX/MIRALAX) powder Take 17 g by mouth 2 (two) times daily as needed. 11/22/17  Yes Forrest Moron, MD  Potassium Chloride ER 20 MEQ TBCR TAKE 1 TABLETS BY MOUTH DAILY TO TWICE DAILY AS DIRECTED. 06/27/16  Yes English, Stephanie D, PA  pravastatin (PRAVACHOL) 40 MG tablet Take 40 mg by mouth daily.   Yes [provider]    Allergies  Allergen Reactions   Norvasc [Amlodipine Besylate]     Per patient caused his liver to shutdown.   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   Stasis dermatitis of both legs 12/21/2012   Edema 12/21/2012   Seizure disorder ([place]) 08/26/2012   Essential hypertension 11/11/2011   Dyslipidemia 11/11/2011   Gout 11/11/2011   Obesity 11/11/2011    Past Medical History:  Diagnosis Date   Allergy    Arthritis    CKD (chronic kidney disease) stage 3, GFR 30-59 ml/min (HCC)    Epilepsy, unspecified, not intractable, without status epilepticus ([place])    Esophageal obstruction    GERD (gastroesophageal reflux disease)    Hyperlipidemia    Hypertension    Osteoarthritis 2018   knee   Seizures (HCC)     Past Surgical History:  Procedure Laterality Date   APPENDECTOMY     TONSILLECTOMY      Social History   Socioeconomic History   Marital status: Married    Spouse name: Not on file   Number of children: 3   Years of education: Not on file   Highest education level: Not on file  Occupational History   Occupation: [employer]   Occupation: [employer]  Tobacco Use   Smoking status: Never   Smokeless tobacco: Never  Vaping Use   Vaping Use: Never used  Substance and Sexual Activity   Alcohol use: Yes    Comment: 1-2 per year   Drug use: No   Sexual activity: Yes  Birth control/protection: None  Other Topics Concern   Not on file  Social History Narrative   Married. Education: [employer].    Social Determinants of Health   Financial Resource Strain: Not on file  Food Insecurity: Not on file  Transportation Needs: Not on file  Physical Activity: Not on file  Stress: Not on file  Social Connections: Not on file  Intimate Partner Violence: Not on file    Family History  Problem Relation Age of Onset   Heart disease Mother        Had a CABG   Colon cancer Father    Seizures Daughter    Diabetes Paternal Aunt    Stomach cancer Neg Hx    Esophageal cancer Neg Hx    Rectal cancer Neg Hx    Liver cancer Neg Hx      Review of Systems   Constitutional: Negative.  Negative for chills and fever.  HENT: Negative.  Negative for sore throat.   Respiratory:  Positive for cough.   Cardiovascular: Negative.   Gastrointestinal:  Negative for abdominal pain, diarrhea, nausea and vomiting.  Musculoskeletal:  Positive for back pain.  Skin: Negative.   Neurological: Negative.  Negative for dizziness and headaches.  Today's Vitals   06/30/22 0903  BP: 126/84  Pulse: 75  Temp: 98.1 F (36.7 C)  TempSrc: Oral  SpO2: 98%  Weight: 233 lb 4 oz (105.8 kg)  Height: '5\' 7"'$  (1.702 m)   Body mass index is 36.53 kg/m.   Physical Exam Vitals reviewed.  Constitutional:      Appearance: Normal appearance.  HENT:     Head: Normocephalic.     Mouth/Throat:     Mouth: Mucous membranes are moist.     Pharynx: Oropharynx is clear.  Eyes:     Extraocular Movements: Extraocular movements intact.     Pupils: Pupils are equal, round, and reactive to light.  Cardiovascular:     Rate and Rhythm: Normal rate and regular rhythm.     Pulses: Normal pulses.     Heart sounds: Normal heart sounds.  Pulmonary:     Effort: Pulmonary effort is normal.     Breath sounds: Normal breath sounds.  Abdominal:     Palpations: Abdomen is soft.     Tenderness: There is no abdominal tenderness.  Musculoskeletal:        General: No tenderness.     Lumbar back: Spasms present. No tenderness or bony tenderness. Decreased range of motion. Negative right straight leg raise test and negative left straight leg raise test.     Right lower leg: No edema.     Left lower leg: No edema.  Lymphadenopathy:     Cervical: No cervical adenopathy.  Skin:    General: Skin is warm and dry.  Neurological:     General: No focal deficit present.     Mental Status: He is alert and oriented to person, place, and time.     Sensory: No sensory deficit.     Motor: No weakness.  Psychiatric:        Mood and Affect: Mood normal.        Behavior: Behavior normal.  DG Lumbar  Spine 2-3 Views  Result Date: 06/30/2022 CLINICAL DATA:  Lumbar pain and muscle spasms for 4 days EXAM: LUMBAR SPINE - 2-3 VIEW COMPARISON:  None Available. FINDINGS: There are 5 non-rib-bearing lumbar-type vertebral bodies. Vertebral body heights are preserved. There is no evidence of acute injury. Alignment is normal.  There is mild disc space narrowing, degenerative endplate change, and facet arthropathy at L3-L4 through L5-S1. The SI joints and symphysis pubis are intact. The soft tissues are unremarkable. IMPRESSION: 1. No acute finding. 2. Degenerative changes at L3-L4 through L5-S1. Electronically Signed   By: Valetta Mole M.D.   On: 06/30/2022 09:52     ASSESSMENT & PLAN: A total of 36 minutes was spent with the patient and counseling/coordination of care regarding preparing for this visit, review of most recent office visit notes, review of multiple chronic medical problems under treatment, review of all medications, diagnosis of lumbar spine spasm and need for x-rays, treatment including need for orthopedic evaluation, pain management, prognosis, documentation, and need for follow-up.  Problem List Items Addressed This Visit       Respiratory   Viral respiratory infection    Running its course.  No red flag signs or symptoms.        Other   Lumbar paraspinal muscle spasm - Primary    Mechanical in nature.  Rapidly improving. Much better today.  No red flag signs or symptoms. May take Tylenol and or Advil as needed for pain. Has occasional sciatica of left leg. X-rays today. Orthopedic evaluation.  Referral placed today.      Relevant Orders   DG Lumbar Spine 2-3 Views   Ambulatory referral to Sports Medicine   Acute cough    Secondary to viral upper respiratory infection. No complications.  Getting better. No concerns.      Other Visit Diagnoses     Need for vaccination       Relevant Orders   Flu Vaccine QUAD High Dose(Fluad)      Patient Instructions  Health  Maintenance After Age 57 After age 16, you are at a higher risk for certain long-term diseases and infections as well as injuries from falls. Falls are a major cause of broken bones and head injuries in people who are older than age 47. Getting regular preventive care can help to keep you healthy and well. Preventive care includes getting regular testing and making lifestyle changes as recommended by your health care provider. Talk with your health care provider about: Which screenings and tests you should have. A screening is a test that checks for a disease when you have no symptoms. A diet and exercise plan that is right for you. What should I know about screenings and tests to prevent falls? Screening and testing are the best ways to find a health problem early. Early diagnosis and treatment give you the best chance of managing medical conditions that are common after age 44. Certain conditions and lifestyle choices may make you more likely to have a fall. Your health care provider may recommend: Regular vision checks. Poor vision and conditions such as cataracts can make you more likely to have a fall. If you wear glasses, make sure to get your prescription updated if your vision changes. Medicine review. Work with your health care provider to regularly review all of the medicines you are taking, including over-the-counter medicines. Ask your health care provider about any side effects that may make you more likely to have a fall. Tell your health care provider if any medicines that you take make you feel dizzy or sleepy. Strength and balance checks. Your health care provider may recommend certain tests to check your strength and balance while standing, walking, or changing positions. Foot health exam. Foot pain and numbness, as well as not wearing proper footwear, can  make you more likely to have a fall. Screenings, including: Osteoporosis screening. Osteoporosis is a condition that causes the bones  to get weaker and break more easily. Blood pressure screening. Blood pressure changes and medicines to control blood pressure can make you feel dizzy. Depression screening. You may be more likely to have a fall if you have a fear of falling, feel depressed, or feel unable to do activities that you used to do. Alcohol use screening. Using too much alcohol can affect your balance and may make you more likely to have a fall. Follow these instructions at home: Lifestyle Do not drink alcohol if: Your health care provider tells you not to drink. If you drink alcohol: Limit how much you have to: 0-1 drink a day for women. 0-2 drinks a day for men. Know how much alcohol is in your drink. In the [place] one drink equals one 12 oz bottle of beer (355 mL), one 5 oz glass of wine (148 mL), or one 1 oz glass of hard liquor (44 mL). Do not use any products that contain nicotine or tobacco. These products include cigarettes, chewing tobacco, and vaping devices, such as e-cigarettes. If you need help quitting, ask your health care provider. Activity  Follow a regular exercise program to stay fit. This will help you maintain your balance. Ask your health care provider what types of exercise are appropriate for you. If you need a cane or walker, use it as recommended by your health care provider. Wear supportive shoes that have nonskid soles. Safety  Remove any tripping hazards, such as rugs, cords, and clutter. Install safety equipment such as grab bars in bathrooms and safety rails on stairs. Keep rooms and walkways well-lit. General instructions Talk with your health care provider about your risks for falling. Tell your health care provider if: You fall. Be sure to tell your health care provider about all falls, even ones that seem minor. You feel dizzy, tiredness (fatigue), or off-balance. Take over-the-counter and prescription medicines only as told by your health care provider. These include  supplements. Eat a healthy diet and maintain a healthy weight. A healthy diet includes low-fat dairy products, low-fat (lean) meats, and fiber from whole grains, beans, and lots of fruits and vegetables. Stay current with your vaccines. Schedule regular health, dental, and eye exams. Summary Having a healthy lifestyle and getting preventive care can help to protect your health and wellness after age 67. Screening and testing are the best way to find a health problem early and help you avoid having a fall. Early diagnosis and treatment give you the best chance for managing medical conditions that are more common for people who are older than age 70. Falls are a major cause of broken bones and head injuries in people who are older than age 37. Take precautions to prevent a fall at home. Work with your health care provider to learn what changes you can make to improve your health and wellness and to prevent falls. This information is not intended to replace advice given to you by your health care provider. Make sure you discuss any questions you have with your health care provider. Document Revised: 02/10/2021 Document Reviewed: 02/10/2021 Elsevier Patient Education  [place], MD [facility]

## 2022-06-30 NOTE — Assessment & Plan Note (Signed)
Secondary to viral upper respiratory infection. No complications.  Getting better. No concerns.

## 2022-06-30 NOTE — Assessment & Plan Note (Signed)
Mechanical in nature.  Rapidly improving. Much better today.  No red flag signs or symptoms. May take Tylenol and or Advil as needed for pain. Has occasional sciatica of left leg. X-rays today. Orthopedic evaluation.  Referral placed today.

## 2022-07-06 NOTE — Progress Notes (Signed)
Wesley Mccarthy D.Wesley Mccarthy Phone: [phone]   Assessment and Plan:     1. Chronic left-sided low back pain with left-sided sciatica -Chronic with exacerbation, initial sports medicine visit - Consistent with sciatica of lumbar etiology with moderate facet changes through lumbar spine and large anterior vertebral cortical connections present at L3-L4 and L4-L5 - Start HEP for low back - Start meloxicam 15 mg daily x2 weeks.  If still having pain after 2 weeks, complete 3rd-week of meloxicam. May use remaining meloxicam as needed once daily for pain control.  Do not to use additional NSAIDs while taking meloxicam.  May use Tylenol [phone] mg 2 to 3 times a day for breakthrough pain.  Other orders - meloxicam (MOBIC) 15 MG tablet; Take 1 tablet (15 mg total) by mouth daily.    Pertinent previous records reviewed include lumbar x-ray 06/30/2022, internal medicine note 06/30/2022   Follow Up: 3 weeks for reevaluation.  If no improvement or worsening of symptoms, could consider advanced imaging and potential epidural injection in the future.  Would discontinue meloxicam and could transition to Tylenol   Subjective:   I, Wesley Mccarthy, am serving as a Education administrator for Doctor Wesley Mccarthy  Chief Complaint: left leg and low back pain   HPI:   07/07/2022 Patient is a 71 year old male complaining of left leg and low back pain. Patient states that sometimes his back hurts b/c of weather, back spasms that started last Friday but now much improved.if he sits for a long time his leg will go to sleep, no MOI, takes tylenol when he has back pain and that seems to help,   Relevant Historical Information: Hypertension, history of seizure  Additional pertinent review of systems negative.   Current Outpatient Medications:    allopurinol (ZYLOPRIM) 300 MG tablet, TAKE ONE TABLET BY MOUTH DAILY, Disp: 30 tablet, Rfl: 1    furosemide (LASIX) 20 MG tablet, Take 20 mg by mouth daily., Disp: , Rfl:    hydrochlorothiazide (HYDRODIURIL) 25 MG tablet, TAKE 1 TABLET (25 MG TOTAL) BY MOUTH DAILY., Disp: 90 tablet, Rfl: 3   lisinopril (PRINIVIL,ZESTRIL) 40 MG tablet, TAKE ONE TABLET BY MOUTH DAILY, Disp: 90 tablet, Rfl: 0   meloxicam (MOBIC) 15 MG tablet, Take 1 tablet (15 mg total) by mouth daily., Disp: 30 tablet, Rfl: 0   omeprazole (PRILOSEC) 20 MG capsule, TAKE ONE CAPSULE BY MOUTH DAILY, Disp: 30 capsule, Rfl: 5   PHENobarbital (LUMINAL) 64.8 MG tablet, TAKE ONE AND ONE-HALF (1 & 1/2) TABLET BY MOUTH AT BEDTIME, Disp: 60 tablet, Rfl: 3   polyethylene glycol powder (GLYCOLAX/MIRALAX) powder, Take 17 g by mouth 2 (two) times daily as needed., Disp: 3350 g, Rfl: 1   Potassium Chloride ER 20 MEQ TBCR, TAKE 1 TABLETS BY MOUTH DAILY TO TWICE DAILY AS DIRECTED., Disp: 180 tablet, Rfl: 0   pravastatin (PRAVACHOL) 40 MG tablet, Take 40 mg by mouth daily., Disp: , Rfl:    Objective:     Vitals:   07/07/22 0749  BP: (!) 132/90  Pulse: 80  SpO2: 97%  Weight: 233 lb (105.7 kg)  Height: '5\' 7"'$  (1.702 m)      Body mass index is 36.49 kg/m.    Physical Exam:    Gen: Appears well, nad, nontoxic and pleasant Psych: Alert and oriented, appropriate mood and affect Neuro: Decreased sensation to light touch over left lateral distal leg and dorsum of left foot  compared to right.  Otherwise sensation intact, strength is 5/5 in upper and lower extremities, muscle tone wnl Skin: no susupicious lesions or rashes  Back - Normal skin, Spine with normal alignment and no deformity.   No tenderness to vertebral process palpation.   Paraspinous muscles are not tender and without spasm NTTP gluteal musculature Straight leg raise negative, though tight hamstring muscles bilaterally Trendelenberg negative Piriformis Test negative    Electronically signed by:  Wesley Mccarthy D.[facility] 8:23 AM 07/07/22

## 2022-07-07 ENCOUNTER — Ambulatory Visit: Payer: Medicare Other | Admitting: Sports Medicine

## 2022-07-07 VITALS — BP 132/90 | HR 80 | Ht 67.0 in | Wt 233.0 lb

## 2022-07-07 DIAGNOSIS — M5442 Lumbago with sciatica, left side: Secondary | ICD-10-CM | POA: Diagnosis not present

## 2022-07-07 DIAGNOSIS — G8929 Other chronic pain: Secondary | ICD-10-CM | POA: Diagnosis not present

## 2022-07-07 MED ORDER — MELOXICAM 15 MG PO TABS: 15.0000 mg | ORAL_TABLET | Freq: Every day | ORAL | 0 refills | Status: DC

## 2022-07-07 NOTE — Patient Instructions (Addendum)
Good to see you  - Start meloxicam 15 mg daily x2 weeks.  If still having pain after 2 weeks, complete 3rd-week of meloxicam. May use remaining meloxicam as needed once daily for pain control.  Do not to use additional NSAIDs while taking meloxicam.  May use Tylenol 500-1000 mg 2 to 3 times a day for breakthrough pain. Low back HEP  3-4 week follow up  

## 2022-07-13 ENCOUNTER — Other Ambulatory Visit: Payer: Self-pay | Admitting: Emergency Medicine

## 2022-07-13 ENCOUNTER — Telehealth: Payer: Self-pay | Admitting: Emergency Medicine

## 2022-07-13 DIAGNOSIS — G40909 Epilepsy, unspecified, not intractable, without status epilepticus: Secondary | ICD-10-CM

## 2022-07-13 MED ORDER — PHENOBARBITAL 64.8 MG PO TABS: ORAL_TABLET | ORAL | 3 refills | Status: DC

## 2022-07-13 NOTE — Telephone Encounter (Signed)
Patient needs his phenobarbitol refilled - please send to [facility] in [facility]

## 2022-07-13 NOTE — Telephone Encounter (Signed)
Called patient and was unable to leave message for patient due to VM being full. If patient calls back please inform patient that his refill request will have to come from his neurologist as it was prescribed by them not Dr. Mitchel Honour

## 2022-07-13 NOTE — Telephone Encounter (Signed)
Reviewed last neurologist office visit assessment and plan.  Recommendation is to stay on phenobarbital for lifetime.  New prescription sent to pharmacy of record today.  Thanks.

## 2022-07-13 NOTE — Telephone Encounter (Signed)
Patient states he doesn't have a neurologist any longer and is wondering if Dr.Sagarida will take over filling it.

## 2022-07-14 NOTE — Telephone Encounter (Signed)
Called patient and was unable to leave message due to VM box being full. If patient calls back please inform patient that his prescription has been sent to his pharmacy on file

## 2022-07-27 NOTE — Progress Notes (Signed)
Benito Mccreedy D.Zena [place] Phone: [phone]   Assessment and Plan:     1. Chronic left-sided low back pain with left-sided sciatica  -Chronic with exacerbation, subsequent visit - Significant improvement in low back pain and complete resolution of radicular symptoms after completion of 2-week course of meloxicam. - Discontinue meloxicam at this time and may use remainder as needed - Patient did not start HEP.  Encouraged to start HEP to prevent recurrence of low back pain  Pertinent previous records reviewed include none   Follow Up: As needed if no improvement or worsening of symptoms.  Could consider repeat course of meloxicam versus physical therapy versus advanced imaging based on symptoms   Subjective:   I, Wesley Mccarthy, am serving as a Education administrator for Doctor [name]   Chief Complaint: left leg and low back pain    HPI:    07/07/2022 Patient is a 71 year old male complaining of left leg and low back pain. Patient states that sometimes his back hurts b/c of weather, back spasms that started last Friday but now much improved.if he sits for a long time his leg will go to sleep, no MOI, takes tylenol when he has back pain and that seems to help,   07/28/2022 Patient states that he doesn't know he hopes hes doing better    Relevant Historical Information: Hypertension, history of seizure  Additional pertinent review of systems negative.   Current Outpatient Medications:    allopurinol (ZYLOPRIM) 300 MG tablet, TAKE ONE TABLET BY MOUTH DAILY, Disp: 30 tablet, Rfl: 1   furosemide (LASIX) 20 MG tablet, Take 20 mg by mouth daily., Disp: , Rfl:    hydrochlorothiazide (HYDRODIURIL) 25 MG tablet, TAKE 1 TABLET (25 MG TOTAL) BY MOUTH DAILY., Disp: 90 tablet, Rfl: 3   lisinopril (PRINIVIL,ZESTRIL) 40 MG tablet, TAKE ONE TABLET BY MOUTH DAILY, Disp: 90 tablet, Rfl: 0   meloxicam (MOBIC) 15 MG tablet,  Take 1 tablet (15 mg total) by mouth daily., Disp: 30 tablet, Rfl: 0   omeprazole (PRILOSEC) 20 MG capsule, TAKE ONE CAPSULE BY MOUTH DAILY, Disp: 30 capsule, Rfl: 5   PHENobarbital (LUMINAL) 64.8 MG tablet, TAKE ONE AND ONE-HALF (1 & 1/2) TABLET BY MOUTH AT BEDTIME, Disp: 90 tablet, Rfl: 3   polyethylene glycol powder (GLYCOLAX/MIRALAX) powder, Take 17 g by mouth 2 (two) times daily as needed., Disp: 3350 g, Rfl: 1   Potassium Chloride ER 20 MEQ TBCR, TAKE 1 TABLETS BY MOUTH DAILY TO TWICE DAILY AS DIRECTED., Disp: 180 tablet, Rfl: 0   pravastatin (PRAVACHOL) 40 MG tablet, Take 40 mg by mouth daily., Disp: , Rfl:    Objective:     Vitals:   07/28/22 0804  BP: 138/89  Pulse: 73  SpO2: 97%  Weight: 237 lb (107.5 kg)  Height: '5\' 7"'$  (1.702 m)      Body mass index is 37.12 kg/m.    Physical Exam:    Gen: Appears well, nad, nontoxic and pleasant Psych: Alert and oriented, appropriate mood and affect Neuro: sensation intact, strength is 5/5 in upper and lower extremities, muscle tone wnl Skin: no susupicious lesions or rashes  Back - Normal skin, Spine with normal alignment and no deformity.   No tenderness to vertebral process palpation.   Paraspinous muscles are not tender and without spasm NTTP gluteal musculature Straight leg raise negative Trendelenberg negative Piriformis Test negative    Electronically signed by:  Suezanne Jacquet  Gypsy Lore.[facility] 8:12 AM 07/28/22

## 2022-07-28 ENCOUNTER — Ambulatory Visit: Payer: Medicare Other | Admitting: Sports Medicine

## 2022-07-28 VITALS — BP 138/89 | HR 73 | Ht 67.0 in | Wt 237.0 lb

## 2022-07-28 DIAGNOSIS — G8929 Other chronic pain: Secondary | ICD-10-CM | POA: Diagnosis not present

## 2022-07-28 DIAGNOSIS — M5442 Lumbago with sciatica, left side: Secondary | ICD-10-CM | POA: Diagnosis not present

## 2022-07-28 NOTE — Patient Instructions (Signed)
Good to see you   

## 2022-08-19 ENCOUNTER — Telehealth: Payer: Self-pay | Admitting: Emergency Medicine

## 2022-08-19 MED ORDER — FUROSEMIDE 20 MG PO TABS: 20.0000 mg | ORAL_TABLET | Freq: Every day | ORAL | 0 refills | Status: DC

## 2022-08-19 NOTE — Telephone Encounter (Signed)
Prescription sent to requested pharmacy. 

## 2022-08-19 NOTE — Telephone Encounter (Signed)
Caller Name: Deny Call back phone #: [phone]   MEDICATION(S):  furosemide (LASIX) 20 MG tablet   Days of Med Remaining: 5 left  Has the patient contacted their pharmacy (YES/NO)? no  [facility]  ~~~Please advise patient/caregiver to allow 2-3 business days to process RX refills.

## 2022-08-19 NOTE — Telephone Encounter (Signed)
Okay to refill? 

## 2022-08-23 ENCOUNTER — Other Ambulatory Visit: Payer: Self-pay | Admitting: Nurse Practitioner

## 2022-08-23 DIAGNOSIS — K2970 Gastritis, unspecified, without bleeding: Secondary | ICD-10-CM

## 2022-09-10 ENCOUNTER — Ambulatory Visit: Payer: Medicare Other

## 2022-09-24 ENCOUNTER — Ambulatory Visit: Payer: Medicare Other

## 2022-10-20 ENCOUNTER — Ambulatory Visit (INDEPENDENT_AMBULATORY_CARE_PROVIDER_SITE_OTHER): Payer: Medicare Other

## 2022-10-20 VITALS — Ht 67.0 in | Wt 230.0 lb

## 2022-10-20 DIAGNOSIS — Z0000 Encounter for general adult medical examination without abnormal findings: Secondary | ICD-10-CM | POA: Diagnosis not present

## 2022-10-20 NOTE — Patient Instructions (Signed)
Wesley Mccarthy , Thank you for taking time to come for your Medicare Wellness Visit. I appreciate your ongoing commitment to your health goals. Please review the following plan we discussed and let me know if I can assist you in the future.   These are the goals we discussed:  Goals      Client understands the importance of follow-up with providers by attending scheduled visits.        This is a list of the screening recommended for you and due dates:  Health Maintenance  Topic Date Due   Hepatitis C Screening: USPSTF Recommendation to screen - Ages 29-79 yo.  Never done   Pneumonia Vaccine (2 - PCV) 07/01/2023*   Medicare Annual Wellness Visit  10/21/2023   DTaP/Tdap/Td vaccine (2 - Td or Tdap) 03/19/2026   Colon Cancer Screening  04/22/2030   Flu Shot  Completed   COVID-19 Vaccine  Completed   Zoster (Shingles) Vaccine  Completed   HPV Vaccine  Aged Out  *Topic was postponed. The date shown is not the original due date.    Advanced directives: No  Conditions/risks identified: Yes  Next appointment: Follow up in one year for your annual wellness visit.   Preventive Care 72 Years and Older, Male  Preventive care refers to lifestyle choices and visits with your health care provider that can promote health and wellness. What does preventive care include? A yearly physical exam. This is also called an annual well check. Dental exams once or twice a year. Routine eye exams. Ask your health care provider how often you should have your eyes checked. Personal lifestyle choices, including: Daily care of your teeth and gums. Regular physical activity. Eating a healthy diet. Avoiding tobacco and drug use. Limiting alcohol use. Practicing safe sex. Taking low doses of aspirin every day. Taking vitamin and mineral supplements as recommended by your health care provider. What happens during an annual well check? The services and screenings done by your health care provider during your  annual well check will depend on your age, overall health, lifestyle risk factors, and family history of disease. Counseling  Your health care provider may ask you questions about your: Alcohol use. Tobacco use. Drug use. Emotional well-being. Home and relationship well-being. Sexual activity. Eating habits. History of falls. Memory and ability to understand (cognition). Work and work Statistician. Screening  You may have the following tests or measurements: Height, weight, and BMI. Blood pressure. Lipid and cholesterol levels. These may be checked every 5 years, or more frequently if you are over 72 years old. Skin check. Lung cancer screening. You may have this screening every year starting at age 72 if you have a 30-pack-year history of smoking and currently smoke or have quit within the past 15 years. Fecal occult blood test (FOBT) of the stool. You may have this test every year starting at age 72. Flexible sigmoidoscopy or colonoscopy. You may have a sigmoidoscopy every 5 years or a colonoscopy every 10 years starting at age 72. Prostate cancer screening. Recommendations will vary depending on your family history and other risks. Hepatitis C blood test. Hepatitis B blood test. Sexually transmitted disease (STD) testing. Diabetes screening. This is done by checking your blood sugar (glucose) after you have not eaten for a while (fasting). You may have this done every 1-3 years. Abdominal aortic aneurysm (AAA) screening. You may need this if you are a current or former smoker. Osteoporosis. You may be screened starting at age 72 if you  are at high risk. Talk with your health care provider about your test results, treatment options, and if necessary, the need for more tests. Vaccines  Your health care provider may recommend certain vaccines, such as: Influenza vaccine. This is recommended every year. Tetanus, diphtheria, and acellular pertussis (Tdap, Td) vaccine. You may need a Td  booster every 10 years. Zoster vaccine. You may need this after age 72. Pneumococcal 13-valent conjugate (PCV13) vaccine. One dose is recommended after age 72. Pneumococcal polysaccharide (PPSV23) vaccine. One dose is recommended after age 72. Talk to your health care provider about which screenings and vaccines you need and how often you need them. This information is not intended to replace advice given to you by your health care provider. Make sure you discuss any questions you have with your health care provider. Document Released: 10/18/2015 Document Revised: 06/10/2016 Document Reviewed: 07/23/2015 Elsevier Interactive Patient Education  2017 [employer] Prevention in the Home Falls can cause injuries. They can happen to people of all ages. There are many things you can do to make your home safe and to help prevent falls. What can I do on the outside of my home? Regularly fix the edges of walkways and driveways and fix any cracks. Remove anything that might make you trip as you walk through a door, such as a raised step or threshold. Trim any bushes or trees on the path to your home. Use bright outdoor lighting. Clear any walking paths of anything that might make someone trip, such as rocks or tools. Regularly check to see if handrails are loose or broken. Make sure that both sides of any steps have handrails. Any raised decks and porches should have guardrails on the edges. Have any leaves, snow, or ice cleared regularly. Use sand or salt on walking paths during winter. Clean up any spills in your garage right away. This includes oil or grease spills. What can I do in the bathroom? Use night lights. Install grab bars by the toilet and in the tub and shower. Do not use towel bars as grab bars. Use non-skid mats or decals in the tub or shower. If you need to sit down in the shower, use a plastic, non-slip stool. Keep the floor dry. Clean up any water that spills on the floor  as soon as it happens. Remove soap buildup in the tub or shower regularly. Attach bath mats securely with double-sided non-slip rug tape. Do not have throw rugs and other things on the floor that can make you trip. What can I do in the bedroom? Use night lights. Make sure that you have a light by your bed that is easy to reach. Do not use any sheets or blankets that are too big for your bed. They should not hang down onto the floor. Have a firm chair that has side arms. You can use this for support while you get dressed. Do not have throw rugs and other things on the floor that can make you trip. What can I do in the kitchen? Clean up any spills right away. Avoid walking on wet floors. Keep items that you use a lot in easy-to-reach places. If you need to reach something above you, use a strong step stool that has a grab bar. Keep electrical cords out of the way. Do not use floor polish or wax that makes floors slippery. If you must use wax, use non-skid floor wax. Do not have throw rugs and other things on the  floor that can make you trip. What can I do with my stairs? Do not leave any items on the stairs. Make sure that there are handrails on both sides of the stairs and use them. Fix handrails that are broken or loose. Make sure that handrails are as long as the stairways. Check any carpeting to make sure that it is firmly attached to the stairs. Fix any carpet that is loose or worn. Avoid having throw rugs at the top or bottom of the stairs. If you do have throw rugs, attach them to the floor with carpet tape. Make sure that you have a light switch at the top of the stairs and the bottom of the stairs. If you do not have them, ask someone to add them for you. What else can I do to help prevent falls? Wear shoes that: Do not have high heels. Have rubber bottoms. Are comfortable and fit you well. Are closed at the toe. Do not wear sandals. If you use a stepladder: Make sure that it is  fully opened. Do not climb a closed stepladder. Make sure that both sides of the stepladder are locked into place. Ask someone to hold it for you, if possible. Clearly mark and make sure that you can see: Any grab bars or handrails. First and last steps. Where the edge of each step is. Use tools that help you move around (mobility aids) if they are needed. These include: Canes. Walkers. Scooters. Crutches. Turn on the lights when you go into a dark area. Replace any light bulbs as soon as they burn out. Set up your furniture so you have a clear path. Avoid moving your furniture around. If any of your floors are uneven, fix them. If there are any pets around you, be aware of where they are. Review your medicines with your doctor. Some medicines can make you feel dizzy. This can increase your chance of falling. Ask your doctor what other things that you can do to help prevent falls. This information is not intended to replace advice given to you by your health care provider. Make sure you discuss any questions you have with your health care provider. Document Released: 07/18/2009 Document Revised: 02/27/2016 Document Reviewed: 10/26/2014 Elsevier Interactive Patient Education  2017 [employer].

## 2022-10-20 NOTE — Progress Notes (Signed)
Virtual Visit via Telephone Note  I connected with  Wesley Mccarthy on 10/20/22 at  2:30 PM EST by telephone and verified that I am speaking with the correct person using two identifiers.  Location: Patient: HOME Provider: [facility] Persons participating in the virtual visit: patient/Nurse Health Advisor   I discussed the limitations, risks, security and privacy concerns of performing an evaluation and management service by telephone and the availability of in person appointments. The patient expressed understanding and agreed to proceed.  Interactive audio and video telecommunications were attempted between this nurse and patient, however failed, due to patient having technical difficulties OR patient did not have access to video capability.  We continued and completed visit with audio only.  Some vital signs may be absent or patient reported.   Sheral Flow, LPN  Subjective:   Wesley Mccarthy is a 72 y.o. male who presents for an Initial Medicare Annual Wellness Visit.  Review of Systems     Cardiac Risk Factors include: advanced age (>36mn, >>74women);dyslipidemia;hypertension;male gender;obesity (BMI >30kg/m2);sedentary lifestyle;family history of premature cardiovascular disease     Objective:    Today's Vitals   10/20/22 1433  Weight: 230 lb (104.3 kg)  Height: '5\' 7"'$  (1.702 m)  PainSc: 0-No pain   Body mass index is 36.02 kg/m.     10/20/2022    2:35 PM 10/06/2016    2:14 PM  Advanced Directives  Does Patient Have a Medical Advance Directive? No No  Would patient like information on creating a medical advance directive? No - Patient declined     Current Medications (verified) Outpatient Encounter Medications as of 10/20/2022  Medication Sig   allopurinol (ZYLOPRIM) 300 MG tablet TAKE ONE TABLET BY MOUTH DAILY   furosemide (LASIX) 20 MG tablet Take 1 tablet (20 mg total) by mouth daily.   hydrochlorothiazide (HYDRODIURIL) 25 MG tablet TAKE 1 TABLET (25 MG  TOTAL) BY MOUTH DAILY.   lisinopril (PRINIVIL,ZESTRIL) 40 MG tablet TAKE ONE TABLET BY MOUTH DAILY   meloxicam (MOBIC) 15 MG tablet Take 1 tablet (15 mg total) by mouth daily.   omeprazole (PRILOSEC) 20 MG capsule TAKE 1 CAPSULE BY MOUTH DAILY   PHENobarbital (LUMINAL) 64.8 MG tablet TAKE ONE AND ONE-HALF (1 & 1/2) TABLET BY MOUTH AT BEDTIME   polyethylene glycol powder (GLYCOLAX/MIRALAX) powder Take 17 g by mouth 2 (two) times daily as needed.   Potassium Chloride ER 20 MEQ TBCR TAKE 1 TABLETS BY MOUTH DAILY TO TWICE DAILY AS DIRECTED.   pravastatin (PRAVACHOL) 40 MG tablet Take 40 mg by mouth daily.   No facility-administered encounter medications on file as of 10/20/2022.    Allergies (verified) Norvasc [amlodipine besylate] and Penicillins   History: Past Medical History:  Diagnosis Date   Allergy    Arthritis    CKD (chronic kidney disease) stage 3, GFR 30-59 ml/min (HCC)    Epilepsy, unspecified, not intractable, without status epilepticus (HCC)    Esophageal obstruction    GERD (gastroesophageal reflux disease)    Hyperlipidemia    Hypertension    Osteoarthritis 2018   knee   Seizures (HCC)    Past Surgical History:  Procedure Laterality Date   APPENDECTOMY     TONSILLECTOMY     Family History  Problem Relation Age of Onset   Heart disease Mother        Had a CABG   Colon cancer Father    Seizures Daughter    Diabetes Paternal Aunt    Stomach cancer  Neg Hx    Esophageal cancer Neg Hx    Rectal cancer Neg Hx    Liver cancer Neg Hx    Social History   Socioeconomic History   Marital status: Married    Spouse name: Not on file   Number of children: 3   Years of education: Not on file   Highest education level: Not on file  Occupational History   Occupation: [employer]   Occupation: [employer] [place] [employer]  Tobacco Use   Smoking status: Never   Smokeless tobacco: Never  Vaping Use   Vaping Use: Never used  Substance and Sexual Activity   Alcohol use:  Yes    Comment: 1-2 per year   Drug use: No   Sexual activity: Yes    Birth control/protection: None  Other Topics Concern   Not on file  Social History Narrative   Married. Education: [employer].    Social Determinants of Health   Financial Resource Strain: Low Risk  (10/20/2022)   Overall Financial Resource Strain (CARDIA)    Difficulty of Paying Living Expenses: Not hard at all  Food Insecurity: No Food Insecurity (10/20/2022)   Hunger Vital Sign    Worried About Running Out of Food in the Last Year: Never true    Ran Out of Food in the Last Year: Never true  Transportation Needs: No Transportation Needs (10/20/2022)   PRAPARE - Hydrologist (Medical): No    Lack of Transportation (Non-Medical): No  Physical Activity: Inactive (10/20/2022)   Exercise Vital Sign    Days of Exercise per Week: 0 days    Minutes of Exercise per Session: 0 min  Stress: No Stress Concern Present (10/20/2022)   [place]    Feeling of Stress : Not at all  Social Connections: [place] (10/20/2022)   Social Connection and Isolation Panel [NHANES]    Frequency of Communication with Friends and Family: More than three times a week    Frequency of Social Gatherings with Friends and Family: More than three times a week    Attends Religious Services: More than 4 times per year    Active Member of [employer] or Organizations: Yes    Attends Music therapist: More than 4 times per year    Marital Status: Married    Tobacco Counseling Counseling given: Not Answered   Clinical Intake:  Pre-visit preparation completed: Yes  Pain : No/denies pain Pain Score: 0-No pain     BMI - recorded: 36.02 Nutritional Status: BMI > 30  Obese Nutritional Risks: None Diabetes: No  How often do you need to have someone help you when you read instructions, pamphlets, or other written materials from your  doctor or pharmacy?: 1 - Never What is the last grade level you completed in school?: HSG; 3 years of college  Diabetic? NO  Interpreter Needed?: No  Information entered by :: Lisette Abu, LPN.   Activities of Daily Living    10/20/2022    2:38 PM  In your present state of health, do you have any difficulty performing the following activities:  Hearing? 0  Vision? 0  Difficulty concentrating or making decisions? 0  Walking or climbing stairs? 0  Dressing or bathing? 0  Doing errands, shopping? 0  Preparing Food and eating ? N  Using the Toilet? N  In the past six months, have you accidently leaked urine? N  Do you have problems with  loss of bowel control? N  Managing your Medications? N  Managing your Finances? N  Housekeeping or managing your Housekeeping? N    Patient Care Team: Horald Pollen, MD as PCP - General (Internal Medicine) Nahser, Wonda Cheng, MD as PCP - Cardiology (Cardiology) [place] Comment, [facility] [place], [place] as Referring Physician (Optometry)  Indicate any recent Medical Services you may have received from other than [facility] providers in the past year (date may be approximate).     Assessment:   This is a routine wellness examination for [facility].  Hearing/Vision screen Hearing Screening - Comments:: Denies hearing difficulties   Vision Screening - Comments:: Wears rx glasses - up to date with routine eye exams with [facility] [place] [facility], OD.   Dietary issues and exercise activities discussed: Current Exercise Habits: The patient does not participate in regular exercise at present, Exercise limited by: neurologic condition(s)   Goals Addressed             This Visit's Progress    Client understands the importance of follow-up with providers by attending scheduled visits.        Depression Screen    10/20/2022    2:38 PM 06/30/2022    9:04 AM 12/29/2021   10:32 AM 11/22/2017   12:25 PM 06/11/2017    9:14 AM 09/14/2016   12:00 PM 07/22/2016    1:41 PM  PHQ 2/9  Scores  PHQ - 2 Score 0 0 0 0 0 0 0    Fall Risk    10/20/2022    2:36 PM 06/30/2022    9:04 AM 12/29/2021   10:32 AM 11/22/2017   12:25 PM 06/11/2017    9:14 AM  [place] in the past year? 0 0 0 No No  Number falls in past yr: 0 0     Injury with Fall? 0 0     Risk for fall due to : No Fall Risks No Fall Risks     Follow up Falls prevention discussed Falls evaluation completed       FALL RISK PREVENTION PERTAINING TO THE HOME:  Any stairs in or around the home? Yes  If so, are there any without handrails? No  Home free of loose throw rugs in walkways, pet beds, electrical cords, etc? Yes  Adequate lighting in your home to reduce risk of falls? Yes   ASSISTIVE DEVICES UTILIZED TO PREVENT FALLS:  Life alert? No  Use of a cane, walker or w/c? No  Grab bars in the bathroom? No  Shower chair or bench in shower? No  Elevated toilet seat or a handicapped toilet? No   TIMED UP AND GO: PHONE VISIT  Was the test performed? No .   Cognitive Function:        10/20/2022    2:38 PM  6CIT Screen  What Year? 0 points  What month? 0 points  What time? 0 points  Count back from 20 0 points  Months in reverse 0 points  Repeat phrase 0 points  Total Score 0 points    Immunizations Immunization History  Administered Date(s) Administered   COVID-19, mRNA, vaccine(Comirnaty)12 years and older 07/06/2022   Fluad Quad(high Dose 65+) 06/30/2022   Influenza Split 08/07/2011, 08/26/2012   Influenza,inj,Quad PF,6+ Mos 07/24/2013, 07/24/2014, 06/20/2015, 12/01/2016   PFIZER(Purple Top)SARS-COV-2 Vaccination 11/28/2019, 12/19/2019, 08/12/2020   Pneumococcal Polysaccharide-23 09/30/2019   Tdap 03/19/2016   Zoster Recombinat (Shingrix) 07/06/2022, 09/15/2022    TDAP status: Up to date  Flu  Vaccine status: Up to date  Pneumococcal vaccine status: Up to date  Covid-19 vaccine status: Completed vaccines  Qualifies for Shingles Vaccine? Yes   Zostavax completed No    Shingrix Completed?: Yes  Screening Tests Health Maintenance  Topic Date Due   Hepatitis C Screening  Never done   Pneumonia Vaccine 26+ Years old (2 - PCV) 07/01/2023 (Originally 09/29/2020)   Medicare Annual Wellness (AWV)  10/21/2023   DTaP/Tdap/Td (2 - Td or Tdap) 03/19/2026   COLONOSCOPY (Pts 45-44yr Insurance coverage will need to be confirmed)  04/22/2030   INFLUENZA VACCINE  Completed   COVID-19 Vaccine  Completed   Zoster Vaccines- Shingrix  Completed   HPV VACCINES  Aged Out    Health Maintenance  Health Maintenance Due  Topic Date Due   Hepatitis C Screening  Never done    Colorectal cancer screening: Type of screening: Colonoscopy. Completed 04/22/2020. Repeat every 10 years  Lung Cancer Screening: (Low Dose CT Chest recommended if Age 72-80years, 30 pack-year currently smoking OR have quit w/in 15years.) does not qualify.   Lung Cancer Screening Referral: NO  Additional Screening:  Hepatitis C Screening: does qualify; Completed NO  Vision Screening: Recommended annual ophthalmology exams for early detection of glaucoma and other disorders of the eye. Is the patient up to date with their annual eye exam?  Yes  Who is the provider or what is the name of the office in which the patient attends annual eye exams? [facility], [facility]. If pt is not established with a provider, would they like to be referred to a provider to establish care? No .   Dental Screening: Recommended annual dental exams for proper oral hygiene  Community Resource Referral / Chronic Care Management: CRR required this visit?  No   CCM required this visit?  No      Plan:     I have personally reviewed and noted the following in the patient's chart:   Medical and social history Use of alcohol, tobacco or illicit drugs  Current medications and supplements including opioid prescriptions. Patient is not currently taking opioid prescriptions. Functional ability and status Nutritional  status Physical activity Advanced directives List of other physicians Hospitalizations, surgeries, and ER visits in previous 12 months Vitals Screenings to include cognitive, depression, and falls Referrals and appointments  In addition, I have reviewed and discussed with patient certain preventive protocols, quality metrics, and best practice recommendations. A written personalized care plan for preventive services as well as general preventive health recommendations were provided to patient.     SSheral Flow LPN   13/38/2505  Nurse Notes: N/A

## 2022-11-03 ENCOUNTER — Ambulatory Visit (INDEPENDENT_AMBULATORY_CARE_PROVIDER_SITE_OTHER): Payer: Medicare Other | Admitting: Emergency Medicine

## 2022-11-03 ENCOUNTER — Encounter: Payer: Self-pay | Admitting: Emergency Medicine

## 2022-11-03 ENCOUNTER — Telehealth: Payer: Self-pay | Admitting: Emergency Medicine

## 2022-11-03 VITALS — BP 138/84 | HR 72 | Temp 97.9°F | Ht 67.0 in | Wt 237.2 lb

## 2022-11-03 DIAGNOSIS — N183 Chronic kidney disease, stage 3 unspecified: Secondary | ICD-10-CM | POA: Insufficient documentation

## 2022-11-03 DIAGNOSIS — N1832 Chronic kidney disease, stage 3b: Secondary | ICD-10-CM | POA: Diagnosis not present

## 2022-11-03 DIAGNOSIS — I10 Essential (primary) hypertension: Secondary | ICD-10-CM

## 2022-11-03 DIAGNOSIS — M1A9XX0 Chronic gout, unspecified, without tophus (tophi): Secondary | ICD-10-CM | POA: Diagnosis not present

## 2022-11-03 DIAGNOSIS — Z6837 Body mass index (BMI) 37.0-37.9, adult: Secondary | ICD-10-CM

## 2022-11-03 DIAGNOSIS — R5383 Other fatigue: Secondary | ICD-10-CM | POA: Diagnosis not present

## 2022-11-03 DIAGNOSIS — G40909 Epilepsy, unspecified, not intractable, without status epilepticus: Secondary | ICD-10-CM

## 2022-11-03 DIAGNOSIS — I129 Hypertensive chronic kidney disease with stage 1 through stage 4 chronic kidney disease, or unspecified chronic kidney disease: Secondary | ICD-10-CM | POA: Diagnosis not present

## 2022-11-03 DIAGNOSIS — E785 Hyperlipidemia, unspecified: Secondary | ICD-10-CM

## 2022-11-03 DIAGNOSIS — E66812 Obesity, class 2: Secondary | ICD-10-CM

## 2022-11-03 LAB — COMPREHENSIVE METABOLIC PANEL
ALT: 18 U/L (ref 0–53)
AST: 22 U/L (ref 0–37)
Albumin: 4.1 g/dL (ref 3.5–5.2)
Alkaline Phosphatase: 145 U/L — ABNORMAL HIGH (ref 39–117)
BUN: 32 mg/dL — ABNORMAL HIGH (ref 6–23)
CO2: 26 mEq/L (ref 19–32)
Calcium: 9.8 mg/dL (ref 8.4–10.5)
Chloride: 102 mEq/L (ref 96–112)
Creatinine, Ser: 2.04 mg/dL — ABNORMAL HIGH (ref 0.40–1.50)
GFR: 32.09 mL/min — ABNORMAL LOW (ref >60.00)
Glucose, Bld: 135 mg/dL — ABNORMAL HIGH (ref 70–99)
Potassium: 3.3 mEq/L — ABNORMAL LOW (ref 3.5–5.1)
Sodium: 139 mEq/L (ref 135–145)
Total Bilirubin: 0.4 mg/dL (ref 0.2–1.2)
Total Protein: 7.8 g/dL (ref 6.0–8.3)

## 2022-11-03 LAB — LIPID PANEL
Cholesterol: 148 mg/dL (ref 0–200)
HDL: 27.8 mg/dL — ABNORMAL LOW (ref >39.00)
NonHDL: 120.07
Total CHOL/HDL Ratio: 5
Triglycerides: 393 mg/dL — ABNORMAL HIGH (ref 0.0–149.0)
VLDL: 78.6 mg/dL — ABNORMAL HIGH (ref 0.0–40.0)

## 2022-11-03 LAB — CBC WITH DIFFERENTIAL/PLATELET
Basophils Absolute: 0 10*3/uL (ref 0.0–0.1)
Basophils Relative: 0.7 % (ref 0.0–3.0)
Eosinophils Absolute: 0.4 10*3/uL (ref 0.0–0.7)
Eosinophils Relative: 6.8 % — ABNORMAL HIGH (ref 0.0–5.0)
HCT: 39.5 % (ref 39.0–52.0)
Hemoglobin: 13 g/dL (ref 13.0–17.0)
Lymphocytes Relative: 21 % (ref 12.0–46.0)
Lymphs Abs: 1.4 10*3/uL (ref 0.7–4.0)
MCHC: 33 g/dL (ref 30.0–36.0)
MCV: 94 fl (ref 78.0–100.0)
Monocytes Absolute: 0.5 10*3/uL (ref 0.1–1.0)
Monocytes Relative: 7.2 % (ref 3.0–12.0)
Neutro Abs: 4.2 10*3/uL (ref 1.4–7.7)
Neutrophils Relative %: 64.3 % (ref 43.0–77.0)
Platelets: 212 10*3/uL (ref 150.0–400.0)
RBC: 4.2 Mil/uL — ABNORMAL LOW (ref 4.22–5.81)
RDW: 14.7 % (ref 11.5–15.5)
WBC: 6.5 10*3/uL (ref 4.0–10.5)

## 2022-11-03 LAB — URINALYSIS, ROUTINE W REFLEX MICROSCOPIC
Bilirubin Urine: NEGATIVE
Hgb urine dipstick: NEGATIVE
Ketones, ur: NEGATIVE
Leukocytes,Ua: NEGATIVE
Nitrite: NEGATIVE
RBC / HPF: NONE SEEN (ref 0–?)
Specific Gravity, Urine: 1.02 (ref 1.000–1.030)
Total Protein, Urine: 30 — AB
Urine Glucose: NEGATIVE
Urobilinogen, UA: 0.2 (ref 0.0–1.0)
WBC, UA: NONE SEEN (ref 0–?)
pH: 6 (ref 5.0–8.0)

## 2022-11-03 LAB — LDL CHOLESTEROL, DIRECT: Direct LDL: 54 mg/dL

## 2022-11-03 LAB — VITAMIN B12: Vitamin B-12: 249 pg/mL (ref 211–911)

## 2022-11-03 LAB — TSH: TSH: 3.71 u[IU]/mL (ref 0.35–5.50)

## 2022-11-03 LAB — HEMOGLOBIN A1C: Hgb A1c MFr Bld: 6.6 % — ABNORMAL HIGH (ref 4.6–6.5)

## 2022-11-03 MED ORDER — ALLOPURINOL 100 MG PO TABS: 100.0000 mg | ORAL_TABLET | Freq: Every day | ORAL | 1 refills | Status: DC

## 2022-11-03 MED ORDER — ALLOPURINOL 300 MG PO TABS: 300.0000 mg | ORAL_TABLET | Freq: Every day | ORAL | 1 refills | Status: DC

## 2022-11-03 MED ORDER — LISINOPRIL 40 MG PO TABS: 40.0000 mg | ORAL_TABLET | Freq: Every day | ORAL | 3 refills | Status: DC

## 2022-11-03 MED ORDER — ATORVASTATIN CALCIUM 40 MG PO TABS: 40.0000 mg | ORAL_TABLET | Freq: Every day | ORAL | 3 refills | Status: DC

## 2022-11-03 NOTE — Patient Instructions (Signed)
Chronic Kidney Disease, Adult Chronic kidney disease is when lasting damage happens to the kidneys slowly over a long time. The kidneys help to: Make pee (urine). Make hormones. Keep the right amount of fluids and chemicals in the body. Most often, this disease does not go away. You must take steps to help keep the kidney damage from getting worse. If steps are not taken, the kidneys might stop working forever. What are the causes? Diabetes. High blood pressure. Diseases that affect the heart and blood vessels. Other kidney diseases. Diseases of the body's disease-fighting system. A problem with the flow of pee. Infections of the organs that make pee, store it, and take it out of the body. Swelling or irritation of your blood vessels. What increases the risk? Getting older. Having someone in your family who has kidney disease or kidney failure. Having a disease caused by genes. Taking medicines often that harm the kidneys. Being near or having contact with harmful substances. Being very overweight. Using tobacco now or in the past. What are the signs or symptoms? Feeling very tired. Having a swollen face, legs, ankles, or feet. Feeling like you may vomit or vomiting. Not feeling hungry. Being confused or not able to focus. Twitches and cramps in the leg muscles or other muscles. Dry, itchy skin. A taste of metal in your mouth. Making less pee, or making more pee. Shortness of breath. Trouble sleeping. You may also become anemic or get weak bones. Anemic means there is not enough red blood cells or hemoglobin in your blood. You may get symptoms slowly. You may not notice them until the kidney damage gets very bad. How is this treated? Often, there is no cure for this disease. Treatment can help with symptoms and help keep the disease from getting worse. You may need to: Avoid alcohol. Avoid foods that are high in salt, potassium, phosphorous, and protein. Take medicines for  symptoms and to help control other conditions. Have dialysis. This treatment gets harmful waste out of your body. Treat other problems that cause your kidney disease or make it worse. Follow these instructions at home: Medicines Take over-the-counter and prescription medicines only as told by your doctor. Do not take any new medicines, vitamins, or supplements unless your doctor says it is okay. Lifestyle  Do not smoke or use any products that contain nicotine or tobacco. If you need help quitting, ask your doctor. If you drink alcohol: Limit how much you use to: 0-1 drink a day for women who are not pregnant. 0-2 drinks a day for men. Know how much alcohol is in your drink. In the [place] one drink equals one 12 oz bottle of beer (355 mL), one 5 oz glass of wine (148 mL), or one 1 oz glass of hard liquor (44 mL). Stay at a healthy weight. If you need help losing weight, ask your doctor. General instructions  Follow instructions from your doctor about what you cannot eat or drink. Track your blood pressure at home. Tell your doctor about any changes. If you have diabetes, track your blood sugar. Exercise at least 30 minutes a day, 5 days a week. Keep your shots (vaccinations) up to date. Keep all follow-up visits. Where to find more information American Association of Kidney Patients: www.aakp.org National Kidney Foundation: www.kidney.org American Kidney Fund: www.akfinc.org Life Options: www.lifeoptions.org Kidney School: www.kidneyschool.org Contact a doctor if: Your symptoms get worse. You get new symptoms. Get help right away if: You get symptoms of end-stage kidney disease. These   include: Headaches. Losing feeling in your hands or feet. Easy bruising. Having hiccups often. Chest pain. Shortness of breath. Lack of menstrual periods, in women. You have a fever. You make less pee than normal. You have pain or you bleed when you pee or poop. These symptoms may be an  emergency. Get help right away. Call your local emergency services (911 in the [place] Do not wait to see if the symptoms will go away. Do not drive yourself to the hospital. Summary Chronic kidney disease is when lasting damage happens to the kidneys slowly over a long time. Causes of this disease include diabetes and high blood pressure. Often, there is no cure for this disease. Treatment can help symptoms and help keep the disease from getting worse. Treatment may involve lifestyle changes, medicines, and dialysis. This information is not intended to replace advice given to you by your health care provider. Make sure you discuss any questions you have with your health care provider. Document Revised: 12/27/2019 Document Reviewed: 12/27/2019 Elsevier Patient Education  2023 Elsevier Inc.  

## 2022-11-03 NOTE — Assessment & Plan Note (Signed)
Recommend to stop pravastatin due to chronic kidney disease and use atorvastatin 40 mg daily. The 10-year ASCVD risk score (Arnett DK, et al., 2019) is: 27.8%   Values used to calculate the score:     Age: 72 years     Sex: Male     Is Non-Hispanic African American: No     Diabetic: No     Tobacco smoker: No     Systolic Blood Pressure: 403 mmHg     Is BP treated: Yes     HDL Cholesterol: 28.6 mg/dL     Total Cholesterol: 154 mg/dL

## 2022-11-03 NOTE — Telephone Encounter (Signed)
Pt was in for a visit today, 1.30.24, and had labs done. Pt got in contact with 'the kidney office' (pt could not remember the name of the office) but they will not schedule him without a referral.   If possible please start a referral for the pt to be seen for kidney issues.

## 2022-11-03 NOTE — Assessment & Plan Note (Signed)
Diet and nutrition discussed.  Advised to decrease amount of daily carbohydrate intake and daily calories and increase amount of plant based protein in his diet 

## 2022-11-03 NOTE — Assessment & Plan Note (Signed)
Differential diagnosis discussed.  Multiple factors. Very likely chronic kidney disease is getting worse. Blood work done today. Needs to follow-up with [facility].

## 2022-11-03 NOTE — Progress Notes (Signed)
Wesley Mccarthy 72 y.o.   Chief Complaint  Patient presents with   Follow-up    F/u appt, fatigued, weight gained    HISTORY OF PRESENT ILLNESS: This is a 72 y.o. male complaining of feeling tired and gaining weight. Has history of chronic kidney disease.  Saw nephrologist last summer. Since our last visit he also saw the cardiologist.  Fairly normal echocardiogram.  No concerns for hypertensive heart disease. Has history of hypertension. No other complaints or medical concerns today.  HPI   Prior to Admission medications   Medication Sig Start Date End Date Taking? Authorizing Provider  allopurinol (ZYLOPRIM) 300 MG tablet TAKE ONE TABLET BY MOUTH DAILY 09/25/16  Yes Stallings, Zoe A, MD  furosemide (LASIX) 20 MG tablet Take 1 tablet (20 mg total) by mouth daily. 08/19/22  Yes Fredia Chittenden, Ines Bloomer, MD  hydrochlorothiazide (HYDRODIURIL) 25 MG tablet TAKE 1 TABLET (25 MG TOTAL) BY MOUTH DAILY. 03/19/16  Yes Ezekiel Slocumb, PA-C  lisinopril (PRINIVIL,ZESTRIL) 40 MG tablet TAKE ONE TABLET BY MOUTH DAILY 03/04/17  Yes Stallings, Zoe A, MD  meloxicam (MOBIC) 15 MG tablet Take 1 tablet (15 mg total) by mouth daily. 07/07/22  Yes [name], DO  omeprazole (PRILOSEC) 20 MG capsule TAKE 1 CAPSULE BY MOUTH DAILY 08/23/22  Yes Noralyn Pick, NP  PHENobarbital (LUMINAL) 64.8 MG tablet TAKE ONE AND ONE-HALF (1 & 1/2) TABLET BY MOUTH AT BEDTIME 07/13/22  Yes Roston Grunewald, Ines Bloomer, MD  polyethylene glycol powder (GLYCOLAX/MIRALAX) powder Take 17 g by mouth 2 (two) times daily as needed. 11/22/17  Yes Forrest Moron, MD  Potassium Chloride ER 20 MEQ TBCR TAKE 1 TABLETS BY MOUTH DAILY TO TWICE DAILY AS DIRECTED. 06/27/16  Yes English, Stephanie D, PA  pravastatin (PRAVACHOL) 40 MG tablet Take 40 mg by mouth daily.   Yes [provider]    Allergies  Allergen Reactions   Norvasc [Amlodipine Besylate]     Per patient caused his liver to shutdown.   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   Stasis dermatitis of both legs 12/21/2012   Edema 12/21/2012   Seizure disorder ([facility]) 08/26/2012   Essential hypertension 11/11/2011   Dyslipidemia 11/11/2011   Gout 11/11/2011   Obesity 11/11/2011    Past Medical History:  Diagnosis Date   Allergy    Arthritis    CKD (chronic kidney disease) stage 3, GFR 30-59 ml/min (HCC)    Epilepsy, unspecified, not intractable, without status epilepticus ([place])    Esophageal obstruction    GERD (gastroesophageal reflux disease)    Hyperlipidemia    Hypertension    Osteoarthritis 2018   knee   Seizures (HCC)     Past Surgical History:  Procedure Laterality Date   APPENDECTOMY     TONSILLECTOMY      Social History   Socioeconomic History   Marital status: Married    Spouse name: Not on file   Number of children: 3   Years of education: Not on file   Highest education level: Not on file  Occupational History   Occupation: [employer]   Occupation: [employer]  Tobacco Use   Smoking status: Never   Smokeless tobacco: Never  Vaping Use   Vaping Use: Never used  Substance and Sexual Activity   Alcohol use: Yes    Comment: 1-2 per year   Drug use:  No   Sexual activity: Yes    Birth control/protection: None  Other Topics Concern   Not on file  Social History Narrative   Married. Education: [employer].    Social Determinants of Health   Financial Resource Strain: Low Risk  (10/20/2022)   Overall Financial Resource Strain (CARDIA)    Difficulty of Paying Living Expenses: Not hard at all  Food Insecurity: No Food Insecurity (10/20/2022)   Hunger Vital Sign    Worried About Running Out of Food in the Last Year: Never true    Ran Out of Food in the Last Year: Never true  Transportation Needs: No Transportation Needs (10/20/2022)   PRAPARE - Hydrologist (Medical): No     Lack of Transportation (Non-Medical): No  Physical Activity: Inactive (10/20/2022)   Exercise Vital Sign    Days of Exercise per Week: 0 days    Minutes of Exercise per Session: 0 min  Stress: No Stress Concern Present (10/20/2022)   [place]    Feeling of Stress : Not at all  Social Connections: [place] (10/20/2022)   Social Connection and Isolation Panel [NHANES]    Frequency of Communication with Friends and Family: More than three times a week    Frequency of Social Gatherings with Friends and Family: More than three times a week    Attends Religious Services: More than 4 times per year    Active Member of [employer] or Organizations: Yes    Attends Music therapist: More than 4 times per year    Marital Status: Married  Human resources officer Violence: Not At Risk (10/20/2022)   Humiliation, Afraid, Rape, and Kick questionnaire    Fear of Current or Ex-Partner: No    Emotionally Abused: No    Physically Abused: No    Sexually Abused: No    Family History  Problem Relation Age of Onset   Heart disease Mother        Had a CABG   Colon cancer Father    Seizures Daughter    Diabetes Paternal Aunt    Stomach cancer Neg Hx    Esophageal cancer Neg Hx    Rectal cancer Neg Hx    Liver cancer Neg Hx      Review of Systems  Constitutional:  Positive for malaise/fatigue. Negative for chills and fever.  HENT: Negative.  Negative for congestion and sore throat.   Respiratory: Negative.  Negative for cough and shortness of breath.   Cardiovascular: Negative.  Negative for chest pain and palpitations.  Gastrointestinal: Negative.  Negative for abdominal pain, diarrhea, nausea and vomiting.  Genitourinary: Negative.  Negative for dysuria and hematuria.  Skin: Negative.  Negative for rash.  Neurological: Negative.  Negative for dizziness and headaches.  All other systems reviewed and are  negative.   Today's Vitals   11/03/22 0805  BP: 138/84  Pulse: 72  Temp: 97.9 F (36.6 C)  TempSrc: Oral  SpO2: 95%  Weight: 237 lb 4 oz (107.6 kg)  Height: '5\' 7"'$  (1.702 m)   Body mass index is 37.16 kg/m. Wt Readings from Last 3 Encounters:  11/03/22 237 lb 4 oz (107.6 kg)  10/20/22 230 lb (104.3 kg)  07/28/22 237 lb (107.5 kg)    Physical Exam Vitals reviewed.  Constitutional:      Appearance: Normal appearance.  HENT:     Head: Normocephalic.     Mouth/Throat:  Mouth: Mucous membranes are moist.     Pharynx: Oropharynx is clear.  Eyes:     Extraocular Movements: Extraocular movements intact.     Pupils: Pupils are equal, round, and reactive to light.  Cardiovascular:     Rate and Rhythm: Normal rate and regular rhythm.     Pulses: Normal pulses.     Heart sounds: Normal heart sounds.  Pulmonary:     Effort: Pulmonary effort is normal.     Breath sounds: Normal breath sounds.  Abdominal:     Palpations: Abdomen is soft.     Tenderness: There is no abdominal tenderness.  Musculoskeletal:     Cervical back: No tenderness.     Right lower leg: No edema.     Left lower leg: No edema.  Lymphadenopathy:     Cervical: No cervical adenopathy.  Skin:    General: Skin is warm and dry.  Neurological:     General: No focal deficit present.     Mental Status: He is alert and oriented to person, place, and time.  Psychiatric:        Mood and Affect: Mood normal.        Behavior: Behavior normal.      ASSESSMENT & PLAN: A total of 45 minutes was spent with the patient and counseling/coordination of care regarding preparing for this visit, review of most recent office visit notes, review of most recent cardiologist office visit note, review of most recent echocardiogram done, review of colonoscopy report from 2021, review of most recent blood work results, review of multiple chronic medical conditions under management, review of all medications, differential  diagnosis of general tiredness and need to follow-up with nephrologist, education on nutrition, need to exercise more, prognosis, documentation and need for follow-up.  Problem List Items Addressed This Visit       Cardiovascular and Mediastinum   Essential hypertension    Well-controlled hypertension with normal blood pressure readings at home. Continue lisinopril 40 mg and hydrochlorothiazide 25 mg daily. Recent echocardiogram unremarkable. Most recent cardiologist office visit notes reviewed.      Relevant Medications   atorvastatin (LIPITOR) 40 MG tablet   lisinopril (ZESTRIL) 40 MG tablet   Other Relevant Orders   Urinalysis   CBC with Differential/Platelet   Comprehensive metabolic panel   Hemoglobin A1c   Lipid panel     Nervous and Auditory   Seizure disorder (HCC)     Genitourinary   CKD (chronic kidney disease) stage 3, GFR 30-59 ml/min (HCC)    Was evaluated by nephrologist last summer but records not available for review at present time. Contributing to general tiredness. Needs blood work today. Needs to follow-up with [facility].      Relevant Orders   Urinalysis   Comprehensive metabolic panel   Hemoglobin A1c   Hypertensive kidney disease   Relevant Orders   Hemoglobin A1c     Other   Dyslipidemia    Recommend to stop pravastatin due to chronic kidney disease and use atorvastatin 40 mg daily. The 10-year ASCVD risk score (Arnett DK, et al., 2019) is: 27.8%   Values used to calculate the score:     Age: 34 years     Sex: Male     Is Non-Hispanic African American: No     Diabetic: No     Tobacco smoker: No     Systolic Blood Pressure: 742 mmHg     Is BP treated: Yes     HDL  Cholesterol: 28.6 mg/dL     Total Cholesterol: 154 mg/dL       Relevant Medications   atorvastatin (LIPITOR) 40 MG tablet   Chronic gout without tophus    No recent flareups.  Recommend to decrease allopurinol to 100 mg a day due to chronic kidney  disease.      Relevant Medications   allopurinol (ZYLOPRIM) 100 MG tablet   Obesity    Diet and nutrition discussed.  Advised to decrease amount of daily carbohydrate intake and daily calories and increase amount of plant-based protein in his diet.      Tiredness - Primary    Differential diagnosis discussed.  Multiple factors. Very likely chronic kidney disease is getting worse. Blood work done today. Needs to follow-up with [facility].      Relevant Orders   Urinalysis   CBC with Differential/Platelet   Comprehensive metabolic panel   Hemoglobin A1c   Lipid panel   TSH   Vitamin B12   Patient Instructions  Chronic Kidney Disease, Adult Chronic kidney disease is when lasting damage happens to the kidneys slowly over a long time. The kidneys help to: Make pee (urine). Make hormones. Keep the right amount of fluids and chemicals in the body. Most often, this disease does not go away. You must take steps to help keep the kidney damage from getting worse. If steps are not taken, the kidneys might stop working forever. What are the causes? Diabetes. High blood pressure. Diseases that affect the heart and blood vessels. Other kidney diseases. Diseases of the body's disease-fighting system. A problem with the flow of pee. Infections of the organs that make pee, store it, and take it out of the body. Swelling or irritation of your blood vessels. What increases the risk? Getting older. Having someone in your family who has kidney disease or kidney failure. Having a disease caused by genes. Taking medicines often that harm the kidneys. Being near or having contact with harmful substances. Being very overweight. Using tobacco now or in the past. What are the signs or symptoms? Feeling very tired. Having a swollen face, legs, ankles, or feet. Feeling like you may vomit or vomiting. Not feeling hungry. Being confused or not able to focus. Twitches and cramps  in the leg muscles or other muscles. Dry, itchy skin. A taste of metal in your mouth. Making less pee, or making more pee. Shortness of breath. Trouble sleeping. You may also become anemic or get weak bones. Anemic means there is not enough red blood cells or hemoglobin in your blood. You may get symptoms slowly. You may not notice them until the kidney damage gets very bad. How is this treated? Often, there is no cure for this disease. Treatment can help with symptoms and help keep the disease from getting worse. You may need to: Avoid alcohol. Avoid foods that are high in salt, potassium, phosphorous, and protein. Take medicines for symptoms and to help control other conditions. Have dialysis. This treatment gets harmful waste out of your body. Treat other problems that cause your kidney disease or make it worse. Follow these instructions at home: Medicines Take over-the-counter and prescription medicines only as told by your doctor. Do not take any new medicines, vitamins, or supplements unless your doctor says it is okay. Lifestyle  Do not smoke or use any products that contain nicotine or tobacco. If you need help quitting, ask your doctor. If you drink alcohol: Limit how much you use to: 0-1 drink  a day for women who are not pregnant. 0-2 drinks a day for men. Know how much alcohol is in your drink. In the [place] one drink equals one 12 oz bottle of beer (355 mL), one 5 oz glass of wine (148 mL), or one 1 oz glass of hard liquor (44 mL). Stay at a healthy weight. If you need help losing weight, ask your doctor. General instructions  Follow instructions from your doctor about what you cannot eat or drink. Track your blood pressure at home. Tell your doctor about any changes. If you have diabetes, track your blood sugar. Exercise at least 30 minutes a day, 5 days a week. Keep your shots (vaccinations) up to date. Keep all follow-up visits. Where to find more  information American Association of Kidney Patients: BombTimer.gl National Kidney Foundation: www.kidney.[place]: https://mathis.com/ Life Options: www.lifeoptions.org Kidney School: www.kidneyschool.org Contact a doctor if: Your symptoms get worse. You get new symptoms. Get help right away if: You get symptoms of end-stage kidney disease. These include: Headaches. Losing feeling in your hands or feet. Easy bruising. Having hiccups often. Chest pain. Shortness of breath. Lack of menstrual periods, in women. You have a fever. You make less pee than normal. You have pain or you bleed when you pee or poop. These symptoms may be an emergency. Get help right away. Call your local emergency services (911 in the [place] Do not wait to see if the symptoms will go away. Do not drive yourself to the hospital. Summary Chronic kidney disease is when lasting damage happens to the kidneys slowly over a long time. Causes of this disease include diabetes and high blood pressure. Often, there is no cure for this disease. Treatment can help symptoms and help keep the disease from getting worse. Treatment may involve lifestyle changes, medicines, and dialysis. This information is not intended to replace advice given to you by your health care provider. Make sure you discuss any questions you have with your health care provider. Document Revised: 12/27/2019 Document Reviewed: 12/27/2019 Elsevier Patient Education  [place], MD [facility]

## 2022-11-03 NOTE — Telephone Encounter (Signed)
Called pt back gave him MD response on labs.Wesley KitchenJohny Chess

## 2022-11-03 NOTE — Assessment & Plan Note (Signed)
No recent flareups.  Recommend to decrease allopurinol to 100 mg a day due to chronic kidney disease.

## 2022-11-03 NOTE — Telephone Encounter (Signed)
New referral sent in to [facility]

## 2022-11-03 NOTE — Assessment & Plan Note (Signed)
Was evaluated by nephrologist last summer but records not available for review at present time. Contributing to general tiredness. Needs blood work today. Needs to follow-up with [facility].

## 2022-11-03 NOTE — Telephone Encounter (Signed)
Patient stated he was returning a call in reference to his lab results, would like a call back to discuss at [phone].

## 2022-11-03 NOTE — Assessment & Plan Note (Signed)
Well-controlled hypertension with normal blood pressure readings at home. Continue lisinopril 40 mg and hydrochlorothiazide 25 mg daily. Recent echocardiogram unremarkable. Most recent cardiologist office visit notes reviewed.

## 2022-11-11 ENCOUNTER — Telehealth: Payer: Self-pay | Admitting: Emergency Medicine

## 2022-11-11 MED ORDER — FUROSEMIDE 20 MG PO TABS: 20.0000 mg | ORAL_TABLET | Freq: Every day | ORAL | 0 refills | Status: DC

## 2022-11-11 NOTE — Telephone Encounter (Signed)
New medication sent to patient pharmacy  

## 2022-11-11 NOTE — Telephone Encounter (Signed)
Caller & Relationship to patient:  self   Call back number:  [phone]   Date of last office visit:  11/03/2022   Date of next office visit:   Medication(s) to be refilled: furosemide 20 mg.        Preferred Pharmacy:  [facility]

## 2022-11-13 ENCOUNTER — Other Ambulatory Visit: Payer: Self-pay | Admitting: Emergency Medicine

## 2022-12-22 ENCOUNTER — Other Ambulatory Visit: Payer: Self-pay | Admitting: Emergency Medicine

## 2022-12-22 DIAGNOSIS — E785 Hyperlipidemia, unspecified: Secondary | ICD-10-CM

## 2023-01-04 ENCOUNTER — Other Ambulatory Visit: Payer: Self-pay | Admitting: Emergency Medicine

## 2023-02-05 ENCOUNTER — Other Ambulatory Visit: Payer: Self-pay | Admitting: Emergency Medicine

## 2023-02-08 ENCOUNTER — Ambulatory Visit (INDEPENDENT_AMBULATORY_CARE_PROVIDER_SITE_OTHER): Payer: Medicare Other | Admitting: Emergency Medicine

## 2023-02-08 ENCOUNTER — Encounter: Payer: Self-pay | Admitting: Emergency Medicine

## 2023-02-08 VITALS — BP 158/94 | HR 70 | Temp 98.0°F | Ht 67.0 in | Wt 240.5 lb

## 2023-02-08 DIAGNOSIS — M47816 Spondylosis without myelopathy or radiculopathy, lumbar region: Secondary | ICD-10-CM | POA: Diagnosis not present

## 2023-02-08 DIAGNOSIS — S39012A Strain of muscle, fascia and tendon of lower back, initial encounter: Secondary | ICD-10-CM

## 2023-02-08 DIAGNOSIS — M545 Low back pain, unspecified: Secondary | ICD-10-CM | POA: Insufficient documentation

## 2023-02-08 MED ORDER — CYCLOBENZAPRINE HCL 10 MG PO TABS: 10.0000 mg | ORAL_TABLET | Freq: Every day | ORAL | 0 refills | Status: DC

## 2023-02-08 MED ORDER — KETOROLAC TROMETHAMINE 60 MG/2ML IM SOLN
60.0000 mg | Freq: Once | INTRAMUSCULAR | Status: AC
  Administered 2023-02-08: 60 mg via INTRAMUSCULAR

## 2023-02-08 MED ORDER — TRAMADOL HCL 50 MG PO TABS: 50.0000 mg | ORAL_TABLET | Freq: Three times a day (TID) | ORAL | 0 refills | Status: AC | PRN

## 2023-02-08 NOTE — Assessment & Plan Note (Signed)
Restricting movement and ambulation Affecting quality of life Pain management discussed Advised to take Flexeril 10 mg at bedtime Tylenol for mild to moderate pain and tramadol for moderate to severe pain

## 2023-02-08 NOTE — Assessment & Plan Note (Signed)
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

## 2023-02-08 NOTE — Progress Notes (Signed)
Wesley Mccarthy 72 y.o.   Chief Complaint  Patient presents with   Back Pain    Lower back pain, x 1 week,  off and on pain, varies     HISTORY OF PRESENT ILLNESS: Acute problem visit today. This is a 72 y.o. male complaining of lumbar pain for the past week Denies bladder or bowel symptoms Sharp localized pain without radiation.  Worse with movement, better with rest.  No associated symptoms No other complaints or medical complaints today  Back Pain Pertinent negatives include no abdominal pain, chest pain, dysuria, fever or headaches.     Prior to Admission medications   Medication Sig Start Date End Date Taking? Authorizing Provider  allopurinol (ZYLOPRIM) 100 MG tablet Take 1 tablet (100 mg total) by mouth daily. 11/03/22  Yes Tyrica Afzal, Eilleen Kempf, MD  atorvastatin (LIPITOR) 40 MG tablet Take 1 tablet (40 mg total) by mouth daily. 11/03/22  Yes Lamoyne Palencia, Eilleen Kempf, MD  furosemide (LASIX) 20 MG tablet TAKE 1 TABLET BY MOUTH DAILY 02/05/23  Yes Akia Desroches, Eilleen Kempf, MD  hydrochlorothiazide (HYDRODIURIL) 25 MG tablet TAKE 1 TABLET (25 MG TOTAL) BY MOUTH DAILY. 03/19/16  Yes Dorna Leitz, PA-C  lisinopril (ZESTRIL) 40 MG tablet Take 1 tablet (40 mg total) by mouth daily. 11/03/22  Yes Georgina Quint, MD  omeprazole (PRILOSEC) 20 MG capsule TAKE 1 CAPSULE BY MOUTH DAILY 08/23/22  Yes Arnaldo Natal, NP  PHENobarbital (LUMINAL) 64.8 MG tablet TAKE ONE AND ONE-HALF (1 & 1/2) TABLET BY MOUTH AT BEDTIME 07/13/22  Yes Dayron Odland, Eilleen Kempf, MD  polyethylene glycol powder (GLYCOLAX/MIRALAX) powder Take 17 g by mouth 2 (two) times daily as needed. 11/22/17  Yes Doristine Bosworth, MD  Potassium Chloride ER 20 MEQ TBCR Take 1 tablet by mouth daily. 01/11/23  Yes [provider]  rosuvastatin (CRESTOR) 20 MG tablet TAKE ONE TABLET BY MOUTH DAILY 12/22/22  Yes Rohen Kimes, Eilleen Kempf, MD    Allergies  Allergen Reactions   Norvasc [Amlodipine Besylate]     Per patient caused  his liver to shutdown.   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   Stasis dermatitis of both legs 12/21/2012   Edema 12/21/2012   Seizure disorder (HCC) 08/26/2012   Essential hypertension 11/11/2011   Dyslipidemia 11/11/2011   Chronic gout without tophus 11/11/2011   Obesity 11/11/2011    Past Medical History:  Diagnosis Date   Allergy    Arthritis    CKD (chronic kidney disease) stage 3, GFR 30-59 ml/min (HCC)    Epilepsy, unspecified, not intractable, without status epilepticus (HCC)    Esophageal obstruction    GERD (gastroesophageal reflux disease)    Hyperlipidemia    Hypertension    Osteoarthritis 2018   knee   Seizures (HCC)     Past Surgical History:  Procedure Laterality Date   APPENDECTOMY     TONSILLECTOMY      Social History   Socioeconomic History   Marital status: Married    Spouse name: Not on file   Number of children: 3   Years of education: Not on file   Highest education level: Not on file  Occupational History   Occupation: [employer]   Occupation: [employer]  Tobacco Use   Smoking status: Never   Smokeless tobacco:  Never  Vaping Use   Vaping Use: Never used  Substance and Sexual Activity   Alcohol use: Yes    Comment: 1-2 per year   Drug use: No   Sexual activity: Yes    Birth control/protection: None  Other Topics Concern   Not on file  Social History Narrative   Married. Education: [employer].    Social Determinants of Health   Financial Resource Strain: Low Risk  (10/20/2022)   Overall Financial Resource Strain (CARDIA)    Difficulty of Paying Living Expenses: Not hard at all  Food Insecurity: No Food Insecurity (10/20/2022)   Hunger Vital Sign    Worried About Running Out of Food in the Last Year: Never true    Ran Out of  Food in the Last Year: Never true  Transportation Needs: No Transportation Needs (10/20/2022)   PRAPARE - Administrator, Civil Service (Medical): No    Lack of Transportation (Non-Medical): No  Physical Activity: Inactive (10/20/2022)   Exercise Vital Sign    Days of Exercise per Week: 0 days    Minutes of Exercise per Session: 0 min  Stress: No Stress Concern Present (10/20/2022)   [employer] of Occupational Health - Occupational Stress Questionnaire    Feeling of Stress : Not at all  Social Connections: Socially Integrated (10/20/2022)   Social Connection and Isolation Panel [NHANES]    Frequency of Communication with Friends and Family: More than three times a week    Frequency of Social Gatherings with Friends and Family: More than three times a week    Attends Religious Services: More than 4 times per year    Active Member of [employer] or Organizations: Yes    Attends Engineer, structural: More than 4 times per year    Marital Status: Married  Catering manager Violence: Not At Risk (10/20/2022)   Humiliation, Afraid, Rape, and Kick questionnaire    Fear of Current or Ex-Partner: No    Emotionally Abused: No    Physically Abused: No    Sexually Abused: No    Family History  Problem Relation Age of Onset   Heart disease Mother        Had a CABG   Colon cancer Father    Seizures Daughter    Diabetes Paternal Aunt    Stomach cancer Neg Hx    Esophageal cancer Neg Hx    Rectal cancer Neg Hx    Liver cancer Neg Hx      Review of Systems  Constitutional: Negative.  Negative for chills and fever.  HENT: Negative.  Negative for congestion and sore throat.   Respiratory: Negative.  Negative for cough and shortness of breath.   Cardiovascular: Negative.  Negative for chest pain and palpitations.  Gastrointestinal:  Negative for abdominal pain, diarrhea, nausea and vomiting.  Genitourinary: Negative.  Negative for dysuria and hematuria.  Musculoskeletal:   Positive for back pain.  Skin: Negative.  Negative for rash.  Neurological:  Negative for dizziness, sensory change, focal weakness and headaches.    Vitals:   02/08/23 1549  BP: (!) 158/94  Pulse: 70  Temp: 98 F (36.7 C)  SpO2: 97%    Physical Exam Vitals reviewed.  Constitutional:      Appearance: Normal appearance.  HENT:     Head: Normocephalic.  Eyes:     Extraocular Movements: Extraocular movements intact.  Cardiovascular:     Rate and Rhythm: Normal rate.  Pulmonary:     Effort:  Pulmonary effort is normal.  Musculoskeletal:     Lumbar back: Spasms and tenderness present. No bony tenderness. Decreased range of motion.  Skin:    General: Skin is warm and dry.  Neurological:     Mental Status: He is alert and oriented to person, place, and time.  Psychiatric:        Mood and Affect: Mood normal.        Behavior: Behavior normal.      ASSESSMENT & PLAN: A total of 33 minutes was spent with the patient and counseling/coordination of care regarding preparing for this visit, review of most recent office visit notes, review of chronic medical conditions under management, review of all medications, diagnosis of lumbar strain and pain management, review of most recent lumbar spine x-ray report, prognosis, documentation and need for follow-up.  Problem List Items Addressed This Visit       Musculoskeletal and Integument   Acute lumbar myofascial strain - Primary    Restricting movement and ambulation Affecting quality of life Pain management discussed Advised to take Flexeril 10 mg at bedtime Tylenol for mild to moderate pain and tramadol for moderate to severe pain      Relevant Medications   cyclobenzaprine (FLEXERIL) 10 MG tablet   traMADol (ULTRAM) 50 MG tablet   Spondylosis of lumbar region without myelopathy or radiculopathy    Clinically stable.  No red flag signs or symptoms. No signs of myelopathy or radiculopathy Pain management discussed       Relevant Medications   cyclobenzaprine (FLEXERIL) 10 MG tablet   traMADol (ULTRAM) 50 MG tablet     Other   Lumbar pain    Secondary to lumbar spine disease Mechanical in nature Pain management discussed      Relevant Medications   cyclobenzaprine (FLEXERIL) 10 MG tablet   traMADol (ULTRAM) 50 MG tablet   Patient Instructions  Acute Back Pain, Adult Acute back pain is sudden and usually short-lived. It is often caused by an injury to the muscles and tissues in the back. The injury may result from: A muscle, tendon, or ligament getting overstretched or torn. Ligaments are tissues that connect bones to each other. Lifting something improperly can cause a back strain. Wear and tear (degeneration) of the spinal disks. Spinal disks are circular tissue that provide cushioning between the bones of the spine (vertebrae). Twisting motions, such as while playing sports or doing yard work. A hit to the back. Arthritis. You may have a physical exam, lab tests, and imaging tests to find the cause of your pain. Acute back pain usually goes away with rest and home care. Follow these instructions at home: Managing pain, stiffness, and swelling Take over-the-counter and prescription medicines only as told by your health care provider. Treatment may include medicines for pain and inflammation that are taken by mouth or applied to the skin, or muscle relaxants. Your health care provider may recommend applying ice during the first 24-48 hours after your pain starts. To do this: Put ice in a plastic bag. Place a towel between your skin and the bag. Leave the ice on for 20 minutes, 2-3 times a day. Remove the ice if your skin turns bright red. This is very important. If you cannot feel pain, heat, or cold, you have a greater risk of damage to the area. If directed, apply heat to the affected area as often as told by your health care provider. Use the heat source that your health care provider recommends,  such as a moist heat pack or a heating pad. Place a towel between your skin and the heat source. Leave the heat on for 20-30 minutes. Remove the heat if your skin turns bright red. This is especially important if you are unable to feel pain, heat, or cold. You have a greater risk of getting burned. Activity  Do not stay in bed. Staying in bed for more than 1-2 days can delay your recovery. Sit up and stand up straight. Avoid leaning forward when you sit or hunching over when you stand. If you work at a desk, sit close to it so you do not need to lean over. Keep your chin tucked in. Keep your neck drawn back, and keep your elbows bent at a 90-degree angle (right angle). Sit high and close to the steering wheel when you drive. Add lower back (lumbar) support to your car seat, if needed. Take short walks on even surfaces as soon as you are able. Try to increase the length of time you walk each day. Do not sit, drive, or stand in one place for more than 30 minutes at a time. Sitting or standing for long periods of time can put stress on your back. Do not drive or use heavy machinery while taking prescription pain medicine. Use proper lifting techniques. When you bend and lift, use positions that put less stress on your back: [place] your knees. Keep the load close to your body. Avoid twisting. Exercise regularly as told by your health care provider. Exercising helps your back heal faster and helps prevent back injuries by keeping muscles strong and flexible. Work with a physical therapist to make a safe exercise program, as recommended by your health care provider. Do any exercises as told by your physical therapist. Lifestyle Maintain a healthy weight. Extra weight puts stress on your back and makes it difficult to have good posture. Avoid activities or situations that make you feel anxious or stressed. Stress and anxiety increase muscle tension and can make back pain worse. Learn ways to manage  anxiety and stress, such as through exercise. General instructions Sleep on a firm mattress in a comfortable position. Try lying on your side with your knees slightly bent. If you lie on your back, put a pillow under your knees. Keep your head and neck in a straight line with your spine (neutral position) when using electronic equipment like smartphones or pads. To do this: Raise your smartphone or pad to look at it instead of bending your head or neck to look down. Put the smartphone or pad at the level of your face while looking at the screen. Follow your treatment plan as told by your health care provider. This may include: Cognitive or behavioral therapy. Acupuncture or massage therapy. Meditation or yoga. Contact a health care provider if: You have pain that is not relieved with rest or medicine. You have increasing pain going down into your legs or buttocks. Your pain does not improve after 2 weeks. You have pain at night. You lose weight without trying. You have a fever or chills. You develop nausea or vomiting. You develop abdominal pain. Get help right away if: You develop new bowel or bladder control problems. You have unusual weakness or numbness in your arms or legs. You feel faint. These symptoms may represent a serious problem that is an emergency. Do not wait to see if the symptoms will go away. Get medical help right away. Call your local emergency services (911 in  the [place] Do not drive yourself to the hospital. Summary Acute back pain is sudden and usually short-lived. Use proper lifting techniques. When you bend and lift, use positions that put less stress on your back. Take over-the-counter and prescription medicines only as told by your health care provider, and apply heat or ice as told. This information is not intended to replace advice given to you by your health care provider. Make sure you discuss any questions you have with your health care provider. Document  Revised: 12/13/2020 Document Reviewed: 12/13/2020 Elsevier Patient Education  2023 Elsevier Inc.    Edwina Barth, MD [facility]

## 2023-02-08 NOTE — Patient Instructions (Signed)
Acute Back Pain, Adult Acute back pain is sudden and usually short-lived. It is often caused by an injury to the muscles and tissues in the back. The injury may result from: A muscle, tendon, or ligament getting overstretched or torn. Ligaments are tissues that connect bones to each other. Lifting something improperly can cause a back strain. Wear and tear (degeneration) of the spinal disks. Spinal disks are circular tissue that provide cushioning between the bones of the spine (vertebrae). Twisting motions, such as while playing sports or doing yard work. A hit to the back. Arthritis. You may have a physical exam, lab tests, and imaging tests to find the cause of your pain. Acute back pain usually goes away with rest and home care. Follow these instructions at home: Managing pain, stiffness, and swelling Take over-the-counter and prescription medicines only as told by your health care provider. Treatment may include medicines for pain and inflammation that are taken by mouth or applied to the skin, or muscle relaxants. Your health care provider may recommend applying ice during the first 24-48 hours after your pain starts. To do this: Put ice in a plastic bag. Place a towel between your skin and the bag. Leave the ice on for 20 minutes, 2-3 times a day. Remove the ice if your skin turns bright red. This is very important. If you cannot feel pain, heat, or cold, you have a greater risk of damage to the area. If directed, apply heat to the affected area as often as told by your health care provider. Use the heat source that your health care provider recommends, such as a moist heat pack or a heating pad. Place a towel between your skin and the heat source. Leave the heat on for 20-30 minutes. Remove the heat if your skin turns bright red. This is especially important if you are unable to feel pain, heat, or cold. You have a greater risk of getting burned. Activity  Do not stay in bed. Staying in  bed for more than 1-2 days can delay your recovery. Sit up and stand up straight. Avoid leaning forward when you sit or hunching over when you stand. If you work at a desk, sit close to it so you do not need to lean over. Keep your chin tucked in. Keep your neck drawn back, and keep your elbows bent at a 90-degree angle (right angle). Sit high and close to the steering wheel when you drive. Add lower back (lumbar) support to your car seat, if needed. Take short walks on even surfaces as soon as you are able. Try to increase the length of time you walk each day. Do not sit, drive, or stand in one place for more than 30 minutes at a time. Sitting or standing for long periods of time can put stress on your back. Do not drive or use heavy machinery while taking prescription pain medicine. Use proper lifting techniques. When you bend and lift, use positions that put less stress on your back: Bend your knees. Keep the load close to your body. Avoid twisting. Exercise regularly as told by your health care provider. Exercising helps your back heal faster and helps prevent back injuries by keeping muscles strong and flexible. Work with a physical therapist to make a safe exercise program, as recommended by your health care provider. Do any exercises as told by your physical therapist. Lifestyle Maintain a healthy weight. Extra weight puts stress on your back and makes it difficult to have good   posture. Avoid activities or situations that make you feel anxious or stressed. Stress and anxiety increase muscle tension and can make back pain worse. Learn ways to manage anxiety and stress, such as through exercise. General instructions Sleep on a firm mattress in a comfortable position. Try lying on your side with your knees slightly bent. If you lie on your back, put a pillow under your knees. Keep your head and neck in a straight line with your spine (neutral position) when using electronic equipment like  smartphones or pads. To do this: Raise your smartphone or pad to look at it instead of bending your head or neck to look down. Put the smartphone or pad at the level of your face while looking at the screen. Follow your treatment plan as told by your health care provider. This may include: Cognitive or behavioral therapy. Acupuncture or massage therapy. Meditation or yoga. Contact a health care provider if: You have pain that is not relieved with rest or medicine. You have increasing pain going down into your legs or buttocks. Your pain does not improve after 2 weeks. You have pain at night. You lose weight without trying. You have a fever or chills. You develop nausea or vomiting. You develop abdominal pain. Get help right away if: You develop new bowel or bladder control problems. You have unusual weakness or numbness in your arms or legs. You feel faint. These symptoms may represent a serious problem that is an emergency. Do not wait to see if the symptoms will go away. Get medical help right away. Call your local emergency services (911 in the [place] Do not drive yourself to the hospital. Summary Acute back pain is sudden and usually short-lived. Use proper lifting techniques. When you bend and lift, use positions that put less stress on your back. Take over-the-counter and prescription medicines only as told by your health care provider, and apply heat or ice as told. This information is not intended to replace advice given to you by your health care provider. Make sure you discuss any questions you have with your health care provider. Document Revised: 12/13/2020 Document Reviewed: 12/13/2020 Elsevier Patient Education  2023 Elsevier Inc.  

## 2023-02-08 NOTE — Assessment & Plan Note (Signed)
Clinically stable.  No red flag signs or symptoms. No signs of myelopathy or radiculopathy Pain management discussed

## 2023-02-16 ENCOUNTER — Telehealth: Payer: Self-pay

## 2023-02-16 ENCOUNTER — Other Ambulatory Visit: Payer: Self-pay | Admitting: Nurse Practitioner

## 2023-02-16 DIAGNOSIS — M546 Pain in thoracic spine: Secondary | ICD-10-CM | POA: Diagnosis not present

## 2023-02-16 DIAGNOSIS — M62830 Muscle spasm of back: Secondary | ICD-10-CM | POA: Diagnosis not present

## 2023-02-16 DIAGNOSIS — M62838 Other muscle spasm: Secondary | ICD-10-CM | POA: Diagnosis not present

## 2023-02-16 DIAGNOSIS — M545 Low back pain, unspecified: Secondary | ICD-10-CM | POA: Diagnosis not present

## 2023-02-16 DIAGNOSIS — K2970 Gastritis, unspecified, without bleeding: Secondary | ICD-10-CM

## 2023-02-16 DIAGNOSIS — M9901 Segmental and somatic dysfunction of cervical region: Secondary | ICD-10-CM | POA: Diagnosis not present

## 2023-02-16 DIAGNOSIS — M542 Cervicalgia: Secondary | ICD-10-CM | POA: Diagnosis not present

## 2023-02-16 DIAGNOSIS — M9903 Segmental and somatic dysfunction of lumbar region: Secondary | ICD-10-CM | POA: Diagnosis not present

## 2023-02-16 DIAGNOSIS — M9902 Segmental and somatic dysfunction of thoracic region: Secondary | ICD-10-CM | POA: Diagnosis not present

## 2023-02-16 NOTE — Telephone Encounter (Signed)
Contacted pt & LVM to return call 

## 2023-02-16 NOTE — Telephone Encounter (Signed)
Contacted pt.-LVM

## 2023-02-18 DIAGNOSIS — M9902 Segmental and somatic dysfunction of thoracic region: Secondary | ICD-10-CM | POA: Diagnosis not present

## 2023-02-18 DIAGNOSIS — M9903 Segmental and somatic dysfunction of lumbar region: Secondary | ICD-10-CM | POA: Diagnosis not present

## 2023-02-18 DIAGNOSIS — M9901 Segmental and somatic dysfunction of cervical region: Secondary | ICD-10-CM | POA: Diagnosis not present

## 2023-02-18 DIAGNOSIS — M62838 Other muscle spasm: Secondary | ICD-10-CM | POA: Diagnosis not present

## 2023-02-18 DIAGNOSIS — M62830 Muscle spasm of back: Secondary | ICD-10-CM | POA: Diagnosis not present

## 2023-02-18 DIAGNOSIS — M546 Pain in thoracic spine: Secondary | ICD-10-CM | POA: Diagnosis not present

## 2023-02-18 DIAGNOSIS — M542 Cervicalgia: Secondary | ICD-10-CM | POA: Diagnosis not present

## 2023-02-18 DIAGNOSIS — M545 Low back pain, unspecified: Secondary | ICD-10-CM | POA: Diagnosis not present

## 2023-02-23 ENCOUNTER — Emergency Department (HOSPITAL_COMMUNITY)
Admission: EM | Admit: 2023-02-23 | Discharge: 2023-02-23 | Disposition: A | Discharge status: Home / Self Care | Payer: Medicare Other | Attending: Emergency Medicine | Admitting: Emergency Medicine

## 2023-02-23 ENCOUNTER — Other Ambulatory Visit: Payer: Self-pay

## 2023-02-23 ENCOUNTER — Encounter (HOSPITAL_COMMUNITY): Payer: Self-pay

## 2023-02-23 ENCOUNTER — Emergency Department (HOSPITAL_COMMUNITY): Payer: Medicare Other

## 2023-02-23 DIAGNOSIS — M9903 Segmental and somatic dysfunction of lumbar region: Secondary | ICD-10-CM | POA: Diagnosis not present

## 2023-02-23 DIAGNOSIS — Z79899 Other long term (current) drug therapy: Secondary | ICD-10-CM | POA: Diagnosis not present

## 2023-02-23 DIAGNOSIS — M62830 Muscle spasm of back: Secondary | ICD-10-CM | POA: Diagnosis not present

## 2023-02-23 DIAGNOSIS — I10 Essential (primary) hypertension: Secondary | ICD-10-CM | POA: Diagnosis not present

## 2023-02-23 DIAGNOSIS — M9902 Segmental and somatic dysfunction of thoracic region: Secondary | ICD-10-CM | POA: Diagnosis not present

## 2023-02-23 DIAGNOSIS — M62838 Other muscle spasm: Secondary | ICD-10-CM | POA: Diagnosis not present

## 2023-02-23 DIAGNOSIS — R1310 Dysphagia, unspecified: Secondary | ICD-10-CM | POA: Diagnosis not present

## 2023-02-23 DIAGNOSIS — M546 Pain in thoracic spine: Secondary | ICD-10-CM | POA: Diagnosis not present

## 2023-02-23 DIAGNOSIS — R600 Localized edema: Secondary | ICD-10-CM | POA: Insufficient documentation

## 2023-02-23 DIAGNOSIS — M545 Low back pain, unspecified: Secondary | ICD-10-CM | POA: Diagnosis not present

## 2023-02-23 DIAGNOSIS — M9901 Segmental and somatic dysfunction of cervical region: Secondary | ICD-10-CM | POA: Diagnosis not present

## 2023-02-23 DIAGNOSIS — M542 Cervicalgia: Secondary | ICD-10-CM | POA: Diagnosis not present

## 2023-02-23 LAB — BASIC METABOLIC PANEL
Anion gap: 11 (ref 5–15)
BUN: 19 mg/dL (ref 8–23)
CO2: 25 mmol/L (ref 22–32)
Calcium: 9.4 mg/dL (ref 8.9–10.3)
Chloride: 101 mmol/L (ref 98–111)
Creatinine, Ser: 1.96 mg/dL — ABNORMAL HIGH (ref 0.61–1.24)
GFR, Estimated: 36 mL/min — ABNORMAL LOW (ref >60)
Glucose, Bld: 99 mg/dL (ref 70–99)
Potassium: 3.5 mmol/L (ref 3.5–5.1)
Sodium: 137 mmol/L (ref 135–145)

## 2023-02-23 LAB — CBC WITH DIFFERENTIAL/PLATELET
Abs Immature Granulocytes: 0.03 10*3/uL (ref 0.00–0.07)
Basophils Absolute: 0.1 10*3/uL (ref 0.0–0.1)
Basophils Relative: 1 %
Eosinophils Absolute: 0.4 10*3/uL (ref 0.0–0.5)
Eosinophils Relative: 8 %
HCT: 44.4 % (ref 39.0–52.0)
Hemoglobin: 13.8 g/dL (ref 13.0–17.0)
Immature Granulocytes: 1 %
Lymphocytes Relative: 22 %
Lymphs Abs: 1.2 10*3/uL (ref 0.7–4.0)
MCH: 29.2 pg (ref 26.0–34.0)
MCHC: 31.1 g/dL (ref 30.0–36.0)
MCV: 93.9 fL (ref 80.0–100.0)
Monocytes Absolute: 0.5 10*3/uL (ref 0.1–1.0)
Monocytes Relative: 9 %
Neutro Abs: 3.1 10*3/uL (ref 1.7–7.7)
Neutrophils Relative %: 59 %
Platelets: 190 10*3/uL (ref 150–400)
RBC: 4.73 MIL/uL (ref 4.22–5.81)
RDW: 12.9 % (ref 11.5–15.5)
WBC: 5.3 10*3/uL (ref 4.0–10.5)
nRBC: 0 % (ref 0.0–0.2)

## 2023-02-23 LAB — TROPONIN I (HIGH SENSITIVITY)
Troponin I (High Sensitivity): 76 ng/L — ABNORMAL HIGH (ref <18)
Troponin I (High Sensitivity): 64 ng/L — ABNORMAL HIGH (ref <18)

## 2023-02-23 NOTE — ED Provider Notes (Addendum)
EMERGENCY DEPARTMENT AT [facility] Provider Note   CSN: [id number] Arrival date & time: 02/23/23  1503     History  No chief complaint on file.   Wesley Mccarthy is a 72 y.o. male.  Patient accompanied by his wife.  Came in by POV.  Patient has a history of esophageal narrowing is had it stretched several times most recently about a year ago followed by [facility] gastroenterology.  About 1330 patient was having chicken and then started vomiting multiple times for about 45 minutes.  Feels better currently denies any tongue swelling or lip swelling patient states that it felt like something got stuck in his esophagus.  He also took an allergy pill not because he thought he was having allergic reaction he just takes it normally patient also experience sweating episodes and some periods of shortness of breath while the vomiting was going on.  Said food particles did come back up with the vomit.  Currently swallowing his saliva without any difficulty.  Blood pressure is noted to be elevated here patient is on lisinopril and recently just the beginning of May was switched to Lasix and stopped his hydrochlorothiazide based on his primary care doctor.  Patient's wife states that blood pressures at home have been trending up.  In addition patient has been having some persistent leg swelling as part of the reason why they switched to Lasix and she thinks also that they switched to Lasix because maybe the lisinopril was hard on his kidneys.  Past medical history significant for seizures arthritis hypertension hyperlipidemia chronic kidney disease stage III history of esophageal obstruction esophageal reflux disease past surgical history significant for appendectomy and tonsillectomy.  Patient never used tobacco products.  Patient takes phenobarbital for the history of seizures.       Home Medications Prior to Admission medications   Medication Sig Start Date End Date Taking? Authorizing  Provider  allopurinol (ZYLOPRIM) 100 MG tablet Take 1 tablet (100 mg total) by mouth daily. 11/03/22   Georgina Quint, MD  atorvastatin (LIPITOR) 40 MG tablet Take 1 tablet (40 mg total) by mouth daily. 11/03/22   Georgina Quint, MD  cyclobenzaprine (FLEXERIL) 10 MG tablet Take 1 tablet (10 mg total) by mouth at bedtime. 02/08/23   Georgina Quint, MD  furosemide (LASIX) 20 MG tablet TAKE 1 TABLET BY MOUTH DAILY 02/05/23   Georgina Quint, MD  hydrochlorothiazide (HYDRODIURIL) 25 MG tablet TAKE 1 TABLET (25 MG TOTAL) BY MOUTH DAILY. 03/19/16   Dorna Leitz, PA-C  lisinopril (ZESTRIL) 40 MG tablet Take 1 tablet (40 mg total) by mouth daily. 11/03/22   Georgina Quint, MD  omeprazole (PRILOSEC) 20 MG capsule TAKE 1 CAPSULE BY MOUTH DAILY 02/16/23   Arnaldo Natal, NP  PHENobarbital (LUMINAL) 64.8 MG tablet TAKE ONE AND ONE-HALF (1 & 1/2) TABLET BY MOUTH AT BEDTIME 07/13/22   Sagardia, Eilleen Kempf, MD  polyethylene glycol powder (GLYCOLAX/MIRALAX) powder Take 17 g by mouth 2 (two) times daily as needed. 11/22/17   Doristine Bosworth, MD  Potassium Chloride ER 20 MEQ TBCR Take 1 tablet by mouth daily. 01/11/23   [provider]  rosuvastatin (CRESTOR) 20 MG tablet TAKE ONE TABLET BY MOUTH DAILY 12/22/22   Georgina Quint, MD      Allergies    Norvasc [amlodipine besylate] and Penicillins    Review of Systems   Review of Systems  Constitutional:  Positive for diaphoresis. Negative for chills and  fever.  HENT:  Positive for trouble swallowing. Negative for ear pain and sore throat.   Eyes:  Negative for pain and visual disturbance.  Respiratory:  Negative for cough and shortness of breath.   Cardiovascular:  Negative for chest pain and palpitations.  Gastrointestinal:  Positive for vomiting. Negative for abdominal pain.  Genitourinary:  Negative for dysuria and hematuria.  Musculoskeletal:  Negative for arthralgias and back pain.  Skin:  Negative for  color change and rash.  Neurological:  Negative for seizures and syncope.  All other systems reviewed and are negative.   Physical Exam Updated Vital Signs BP (!) 165/97 (BP Location: Right Arm)   Pulse 71   Temp 98.1 F (36.7 C) (Oral)   Resp 18   SpO2 99%  Physical Exam Vitals and nursing note reviewed.  Constitutional:      General: He is not in acute distress.    Appearance: Normal appearance. He is well-developed.  HENT:     Head: Normocephalic and atraumatic.     Mouth/Throat:     Mouth: Mucous membranes are moist.     Comments: Lower lip with a cold sore.  No obvious swelling of the lips.  No swelling of the tongue.  No swelling of the floor of the mouth. Eyes:     Extraocular Movements: Extraocular movements intact.     Conjunctiva/sclera: Conjunctivae normal.     Pupils: Pupils are equal, round, and reactive to light.  Cardiovascular:     Rate and Rhythm: Normal rate and regular rhythm.     Heart sounds: No murmur heard. Pulmonary:     Effort: Pulmonary effort is normal. No respiratory distress.     Breath sounds: Normal breath sounds.  Abdominal:     Palpations: Abdomen is soft.     Tenderness: There is no abdominal tenderness.  Musculoskeletal:        General: Swelling present.     Cervical back: Normal range of motion and neck supple.     Right lower leg: Edema present.     Left lower leg: Edema present.     Comments: Some bilateral pitting edema right greater than left.  Skin:    General: Skin is warm and dry.     Capillary Refill: Capillary refill takes less than 2 seconds.  Neurological:     General: No focal deficit present.     Mental Status: He is alert and oriented to person, place, and time.     Cranial Nerves: No cranial nerve deficit.     Sensory: No sensory deficit.     Motor: No weakness.     Comments: Patient talking without any hoarseness.  Psychiatric:        Mood and Affect: Mood normal.     ED Results / Procedures / Treatments    Labs (all labs ordered are listed, but only abnormal results are displayed) Labs Reviewed  BASIC METABOLIC PANEL - Abnormal; Notable for the following components:      Result Value   Creatinine, Ser 1.96 (*)    GFR, Estimated 36 (*)    All other components within normal limits  TROPONIN I (HIGH SENSITIVITY) - Abnormal; Notable for the following components:   Troponin I (High Sensitivity) 76 (*)    All other components within normal limits  TROPONIN I (HIGH SENSITIVITY) - Abnormal; Notable for the following components:   Troponin I (High Sensitivity) 64 (*)    All other components within normal limits  CBC WITH DIFFERENTIAL/PLATELET  EKG EKG Interpretation  Date/Time:  Tuesday Feb 23 2023 17:51:36 EDT Ventricular Rate:  72 PR Interval:  242 QRS Duration: 91 QT Interval:  408 QTC Calculation: 447 R Axis:   -37 Text Interpretation: Sinus rhythm Prolonged PR interval Probable left atrial enlargement Left axis deviation Consider anterior infarct Confirmed by Vanetta Mulders [id number]) on 02/23/2023 6:09:16 PM  Radiology DG Chest Port 1 View  Result Date: 02/23/2023 CLINICAL DATA:  Dysphagia. EXAM: PORTABLE CHEST 1 VIEW COMPARISON:  October 10, 2020 FINDINGS: The heart size and mediastinal contours are within normal limits. Both lungs are clear. The visualized skeletal structures are unremarkable. IMPRESSION: No active disease. Electronically Signed   By: Aram Candela M.D.   On: 02/23/2023 17:32    Procedures Procedures    Medications Ordered in ED Medications - No data to display  ED Course/ Medical Decision Making/ A&P                             Medical Decision Making Amount and/or Complexity of Data Reviewed Labs: ordered. Radiology: ordered.   Blood pressure is running high here initially was 160/104 work on getting 150s over 95 is well.  Temp was 99.9 pulse was 83 respirations 16.  Oxygen saturation on room air 97%.  Get chest x-ray will get basic labs and  will give a challenge with water first and then use and then crackers to make sure that he is able to swallow.  Clinically sounds as if probably had an esophageal impaction it may have relieved itself.  In addition there is some concern about his elevated blood pressure wife says has been trending up.  They are able to call his primary care doctor to see what adjustments they will want to make and they are able to check his blood pressure at home.  Will see what her labs show here today. No acute reason to intervene in the blood pressure currently.  Patient was able to drink liquids and eat food without any difficulties with no concerns about food impaction.  Patient's initial troponin was elevated at 76 so we need to do a delta troponin since his symptoms were diaphoresis persistent vomiting with discomfort but does not like as a cardiac event because of the delta troponin was 64.  So went down.  CBC no leukocytosis hemoglobin 13.8 platelets were normal at 190 and basic metabolic panel had no acute abnormalities chest x-ray had no acute disease process.  Patient is continued not to have any difficulty with swallowing.  Patient is followed by gastroenterology has had his esophagus stretched before we will have them call them for follow-up.  Will also have him follow-up with his primary care doctor regarding his blood pressures.  They have remained elevated here with diastolics essentially above 150.  Patient in no acute distress.  Does have blood pressure medicine at home no reason for acute intervention.  But based on today's readings and what his wife says they have been for the past several days at home he probably needs adjustment in his blood pressure medicines.  Also based on his basic metabolic panel his creatinine was 1.96 BUN 19 this is ballpark for him GFR 36.  Patient is known to have chronic kidney disease stage III and his GFR's are usually 30-59.   Final Clinical Impression(s) / ED  Diagnoses Final diagnoses:  Dysphagia, unspecified type  Primary hypertension    Rx / DC Orders ED Discharge  Orders     None         Vanetta Mulders, MD 02/23/23 1730    Vanetta Mulders, MD 02/23/23 2209    Vanetta Mulders, MD 02/23/23 2209

## 2023-02-23 NOTE — ED Notes (Signed)
Pt given water to trail swallow screen. Pt able to swallow water with no difficulties, coughing, or chocking. Pt then able to swallow cranberry juice, pt given graham crackers and able to eat with no concerns.

## 2023-02-23 NOTE — ED Triage Notes (Signed)
PT arrived POV with a c/o of eating, then took a allergy pill which caused him to throw up for about 45 min and since he has trouble swallowing.PT is also experiencing sweating episodes and periods of SOB.

## 2023-02-23 NOTE — ED Notes (Signed)
Called lab to add troponin lab on

## 2023-02-23 NOTE — Discharge Instructions (Signed)
Workup here today without any acute findings blood pressure though is elevated would contact primary care doctor to evaluate the trend of his blood pressures may need adjustment in his medicines.  Renal function is baseline for him here today.  Also would recommend he contact his gastroenterologist at [facility] to see whether he is due for another stretching.  Delta troponins without any significant abnormalities.  Would return for any new or worse symptoms.

## 2023-02-24 ENCOUNTER — Ambulatory Visit (INDEPENDENT_AMBULATORY_CARE_PROVIDER_SITE_OTHER): Payer: Medicare Other | Admitting: Emergency Medicine

## 2023-02-24 ENCOUNTER — Encounter: Payer: Self-pay | Admitting: Emergency Medicine

## 2023-02-24 ENCOUNTER — Telehealth: Payer: Self-pay | Admitting: Gastroenterology

## 2023-02-24 VITALS — BP 158/82 | HR 77 | Temp 98.0°F | Ht 67.0 in | Wt 235.5 lb

## 2023-02-24 DIAGNOSIS — G40909 Epilepsy, unspecified, not intractable, without status epilepticus: Secondary | ICD-10-CM

## 2023-02-24 DIAGNOSIS — E785 Hyperlipidemia, unspecified: Secondary | ICD-10-CM | POA: Diagnosis not present

## 2023-02-24 DIAGNOSIS — I10 Essential (primary) hypertension: Secondary | ICD-10-CM

## 2023-02-24 DIAGNOSIS — K222 Esophageal obstruction: Secondary | ICD-10-CM

## 2023-02-24 DIAGNOSIS — N1832 Chronic kidney disease, stage 3b: Secondary | ICD-10-CM

## 2023-02-24 DIAGNOSIS — R1319 Other dysphagia: Secondary | ICD-10-CM | POA: Diagnosis not present

## 2023-02-24 DIAGNOSIS — R1310 Dysphagia, unspecified: Secondary | ICD-10-CM

## 2023-02-24 MED ORDER — VALSARTAN-HYDROCHLOROTHIAZIDE 320-12.5 MG PO TABS
1.0000 | ORAL_TABLET | Freq: Every day | ORAL | 3 refills | Status: DC
Start: 2023-02-24 — End: 2024-01-20

## 2023-02-24 NOTE — Telephone Encounter (Signed)
Inbound call from patient spouse requesting a sooner apt due to patient having to be seen at the hospital. Also requesting to speak with a nurse. Please advise.  Thank you

## 2023-02-24 NOTE — Progress Notes (Signed)
Wesley Mccarthy 72 y.o.   Chief Complaint  Patient presents with   Follow-up    Patient was in the ED last night for dysphagia, patient states he is doing ok. Wife states she is only giving him soft foods  Elevated BP x 2 weeks     HISTORY OF PRESENT ILLNESS: This is a 72 y.o. male here for follow-up of emergency department visit last night when he presented with difficulty swallowing Has history of esophageal stricture.  Dilated once last year.  They contacted GI doctor this morning and scheduled appointment Found to have elevated blood pressure.  Still elevated today. No other complaints or medical concerns today. BP Readings from Last 3 Encounters:  02/23/23 (!) 170/92  02/08/23 (!) 158/94  11/03/22 138/84     HPI   Prior to Admission medications   Medication Sig Start Date End Date Taking? Authorizing Provider  allopurinol (ZYLOPRIM) 100 MG tablet Take 1 tablet (100 mg total) by mouth daily. 11/03/22  Yes Lovelace Cerveny, Eilleen Kempf, MD  atorvastatin (LIPITOR) 40 MG tablet Take 1 tablet (40 mg total) by mouth daily. 11/03/22  Yes Jensine Luz, Eilleen Kempf, MD  cyclobenzaprine (FLEXERIL) 10 MG tablet Take 1 tablet (10 mg total) by mouth at bedtime. 02/08/23  Yes Alese Furniss, Eilleen Kempf, MD  furosemide (LASIX) 20 MG tablet TAKE 1 TABLET BY MOUTH DAILY 02/05/23  Yes Farooq Petrovich, Eilleen Kempf, MD  lisinopril (ZESTRIL) 40 MG tablet Take 1 tablet (40 mg total) by mouth daily. 11/03/22  Yes Georgina Quint, MD  omeprazole (PRILOSEC) 20 MG capsule TAKE 1 CAPSULE BY MOUTH DAILY 02/16/23  Yes Arnaldo Natal, NP  PHENobarbital (LUMINAL) 64.8 MG tablet TAKE ONE AND ONE-HALF (1 & 1/2) TABLET BY MOUTH AT BEDTIME 07/13/22  Yes Bridger Pizzi, Eilleen Kempf, MD  polyethylene glycol powder (GLYCOLAX/MIRALAX) powder Take 17 g by mouth 2 (two) times daily as needed. 11/22/17  Yes Doristine Bosworth, MD  Potassium Chloride ER 20 MEQ TBCR Take 1 tablet by mouth daily. 01/11/23  Yes [provider]  rosuvastatin  (CRESTOR) 20 MG tablet TAKE ONE TABLET BY MOUTH DAILY 12/22/22  Yes Kashlynn Kundert, Eilleen Kempf, MD  hydrochlorothiazide (HYDRODIURIL) 25 MG tablet TAKE 1 TABLET (25 MG TOTAL) BY MOUTH DAILY. Patient not taking: Reported on 02/24/2023 03/19/16   Dorna Leitz, PA-C    Allergies  Allergen Reactions   Norvasc [Amlodipine Besylate]     Per patient caused his liver to shutdown.   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   Stasis dermatitis of both legs 12/21/2012   Edema 12/21/2012   Seizure disorder (HCC) 08/26/2012   Essential hypertension 11/11/2011   Dyslipidemia 11/11/2011   Chronic gout without tophus 11/11/2011   Obesity 11/11/2011    Past Medical History:  Diagnosis Date   Allergy    Arthritis    CKD (chronic kidney disease) stage 3, GFR 30-59 ml/min (HCC)    Epilepsy, unspecified, not intractable, without status epilepticus (HCC)    Esophageal obstruction    GERD (gastroesophageal reflux disease)    Hyperlipidemia    Hypertension    Osteoarthritis 2018   knee   Seizures (HCC)     Past Surgical History:  Procedure  Laterality Date   APPENDECTOMY     TONSILLECTOMY      Social History   Socioeconomic History   Marital status: Married    Spouse name: Not on file   Number of children: 3   Years of education: Not on file   Highest education level: Not on file  Occupational History   Occupation: [employer]   Occupation: [employer]  Tobacco Use   Smoking status: Never   Smokeless tobacco: Never  Vaping Use   Vaping Use: Never used  Substance and Sexual Activity   Alcohol use: Yes    Comment: 1-2 per year   Drug use: No   Sexual activity: Yes     Birth control/protection: None  Other Topics Concern   Not on file  Social History Narrative   Married. Education: [employer].    Social Determinants of Health   Financial Resource Strain: Low Risk  (10/20/2022)   Overall Financial Resource Strain (CARDIA)    Difficulty of Paying Living Expenses: Not hard at all  Food Insecurity: No Food Insecurity (10/20/2022)   Hunger Vital Sign    Worried About Running Out of Food in the Last Year: Never true    Ran Out of Food in the Last Year: Never true  Transportation Needs: No Transportation Needs (10/20/2022)   PRAPARE - Administrator, Civil Service (Medical): No    Lack of Transportation (Non-Medical): No  Physical Activity: Inactive (10/20/2022)   Exercise Vital Sign    Days of Exercise per Week: 0 days    Minutes of Exercise per Session: 0 min  Stress: No Stress Concern Present (10/20/2022)   [employer] of Occupational Health - Occupational Stress Questionnaire    Feeling of Stress : Not at all  Social Connections: Socially Integrated (10/20/2022)   Social Connection and Isolation Panel [NHANES]    Frequency of Communication with Friends and Family: More than three times a week    Frequency of Social Gatherings with Friends and Family: More than three times a week    Attends Religious Services: More than 4 times per year    Active Member of [employer] or Organizations: Yes    Attends Engineer, structural: More than 4 times per year    Marital Status: Married  Catering manager Violence: Not At Risk (10/20/2022)   Humiliation, Afraid, Rape, and Kick questionnaire    Fear of Current or Ex-Partner: No    Emotionally Abused: No    Physically Abused: No    Sexually Abused: No    Family History  Problem Relation Age of Onset   Heart disease Mother        Had a CABG   Colon cancer Father    Seizures Daughter    Diabetes Paternal Aunt    Stomach cancer Neg Hx    Esophageal cancer Neg Hx    Rectal cancer Neg Hx     Liver cancer Neg Hx      Review of Systems  Constitutional: Negative.  Negative for chills and fever.  HENT: Negative.  Negative for congestion and sore throat.   Respiratory: Negative.  Negative for cough and shortness of breath.   Cardiovascular: Negative.  Negative for chest pain and palpitations.  Gastrointestinal:  Negative for abdominal pain, diarrhea, nausea and vomiting.  Genitourinary: Negative.  Negative for dysuria and hematuria.  Skin: Negative.  Negative for rash.  Neurological: Negative.  Negative for dizziness and headaches.  All other systems reviewed and are  negative.   Vitals:   02/24/23 1425  Pulse: 77  Temp: 98 F (36.7 C)  SpO2: 98%    Physical Exam Vitals reviewed.  Constitutional:      Appearance: Normal appearance.  HENT:     Head: Normocephalic.     Mouth/Throat:     Mouth: Mucous membranes are moist.     Pharynx: Oropharynx is clear.  Eyes:     Extraocular Movements: Extraocular movements intact.  Cardiovascular:     Rate and Rhythm: Normal rate and regular rhythm.     Pulses: Normal pulses.     Heart sounds: Normal heart sounds.  Pulmonary:     Effort: Pulmonary effort is normal.     Breath sounds: Normal breath sounds.  Abdominal:     Palpations: Abdomen is soft.     Tenderness: There is no abdominal tenderness.  Skin:    General: Skin is warm and dry.     Capillary Refill: Capillary refill takes less than 2 seconds.  Neurological:     General: No focal deficit present.     Mental Status: He is alert and oriented to person, place, and time.  Psychiatric:        Mood and Affect: Mood normal.        Behavior: Behavior normal.      ASSESSMENT & PLAN: A total of 47 minutes was spent with the patient and counseling/coordination of care regarding preparing for this visit, review of most recent office visit notes, review of most recent emergency department visit notes, review of most recent blood work results, review of multiple  chronic medical conditions under management, diagnosis of hypertension and cardiovascular risks associated with uncontrolled hypertension, review of all medications and changes made, prognosis, need for GI follow-up and possible upper endoscopy, documentation and need for follow-up.  Problem List Items Addressed This Visit       Cardiovascular and Mediastinum   Essential hypertension - Primary    Uncontrolled hypertension. Recommend to stop lisinopril and hydrochlorothiazide and start valsartan HCT 320-12.5 mg daily Cardiovascular risks associated with uncontrolled hypertension discussed Advised to monitor blood pressure readings at home daily for the next several weeks and keep a log.  Advised to contact the office if numbers persistently abnormal. Follow-up in 3 months      Relevant Medications   valsartan-hydrochlorothiazide (DIOVAN-HCT) 320-12.5 MG tablet     Digestive   Esophageal dysphagia    Stable today.  Able to eat and drink however only eating soft food along with fluids. Wife contacted GI doctor today to schedule upper endoscopy. Appointment still pending.        Nervous and Auditory   Seizure disorder (HCC)    Stable.  Continues on phenobarbital 64.8 mg 1-1/2 tablets at bedtime        Genitourinary   CKD (chronic kidney disease) stage 3, GFR 30-59 ml/min (HCC)    Advised to stay well-hydrated and avoid NSAIDs        Other   Dyslipidemia    Stable chronic medical condition Continue rosuvastatin 20 mg daily Was mistakenly also taking atorvastatin.  Advised to stop it.      Patient Instructions  Hypertension, Adult High blood pressure (hypertension) is when the force of blood pumping through the arteries is too strong. The arteries are the blood vessels that carry blood from the heart throughout the body. Hypertension forces the heart to work harder to pump blood and may cause arteries to become narrow or stiff. Untreated or  uncontrolled hypertension can lead  to a heart attack, heart failure, a stroke, kidney disease, and other problems. A blood pressure reading consists of a higher number over a lower number. Ideally, your blood pressure should be below 120/80. The first ("top") number is called the systolic pressure. It is a measure of the pressure in your arteries as your heart beats. The second ("bottom") number is called the diastolic pressure. It is a measure of the pressure in your arteries as the heart relaxes. What are the causes? The exact cause of this condition is not known. There are some conditions that result in high blood pressure. What increases the risk? Certain factors may make you more likely to develop high blood pressure. Some of these risk factors are under your control, including: Smoking. Not getting enough exercise or physical activity. Being overweight. Having too much fat, sugar, calories, or salt (sodium) in your diet. Drinking too much alcohol. Other risk factors include: Having a personal history of heart disease, diabetes, high cholesterol, or kidney disease. Stress. Having a family history of high blood pressure and high cholesterol. Having obstructive sleep apnea. Age. The risk increases with age. What are the signs or symptoms? High blood pressure may not cause symptoms. Very high blood pressure (hypertensive crisis) may cause: Headache. Fast or irregular heartbeats (palpitations). Shortness of breath. Nosebleed. Nausea and vomiting. Vision changes. Severe chest pain, dizziness, and seizures. How is this diagnosed? This condition is diagnosed by measuring your blood pressure while you are seated, with your arm resting on a flat surface, your legs uncrossed, and your feet flat on the floor. The cuff of the blood pressure monitor will be placed directly against the skin of your upper arm at the level of your heart. Blood pressure should be measured at least twice using the same arm. Certain conditions can cause  a difference in blood pressure between your right and left arms. If you have a high blood pressure reading during one visit or you have normal blood pressure with other risk factors, you may be asked to: Return on a different day to have your blood pressure checked again. Monitor your blood pressure at home for 1 week or longer. If you are diagnosed with hypertension, you may have other blood or imaging tests to help your health care provider understand your overall risk for other conditions. How is this treated? This condition is treated by making healthy lifestyle changes, such as eating healthy foods, exercising more, and reducing your alcohol intake. You may be referred for counseling on a healthy diet and physical activity. Your health care provider may prescribe medicine if lifestyle changes are not enough to get your blood pressure under control and if: Your systolic blood pressure is above 130. Your diastolic blood pressure is above 80. Your personal target blood pressure may vary depending on your medical conditions, your age, and other factors. Follow these instructions at home: Eating and drinking  Eat a diet that is high in fiber and potassium, and low in sodium, added sugar, and fat. An example of this eating plan is called the DASH diet. DASH stands for Dietary Approaches to Stop Hypertension. To eat this way: Eat plenty of fresh fruits and vegetables. Try to fill one half of your plate at each meal with fruits and vegetables. Eat whole grains, such as whole-wheat pasta, brown rice, or whole-grain bread. Fill about one fourth of your plate with whole grains. Eat or drink low-fat dairy products, such as skim milk or  low-fat yogurt. Avoid fatty cuts of meat, processed or cured meats, and poultry with skin. Fill about one fourth of your plate with lean proteins, such as fish, chicken without skin, beans, eggs, or tofu. Avoid pre-made and processed foods. These tend to be higher in  sodium, added sugar, and fat. Reduce your daily sodium intake. Many people with hypertension should eat less than 1,500 mg of sodium a day. Do not drink alcohol if: Your health care provider tells you not to drink. You are pregnant, may be pregnant, or are planning to become pregnant. If you drink alcohol: Limit how much you have to: 0-1 drink a day for women. 0-2 drinks a day for men. Know how much alcohol is in your drink. In the [place] one drink equals one 12 oz bottle of beer (355 mL), one 5 oz glass of wine (148 mL), or one 1 oz glass of hard liquor (44 mL). Lifestyle  Work with your health care provider to maintain a healthy body weight or to lose weight. Ask what an ideal weight is for you. Get at least 30 minutes of exercise that causes your heart to beat faster (aerobic exercise) most days of the week. Activities may include walking, swimming, or biking. Include exercise to strengthen your muscles (resistance exercise), such as Pilates or lifting weights, as part of your weekly exercise routine. Try to do these types of exercises for 30 minutes at least 3 days a week. Do not use any products that contain nicotine or tobacco. These products include cigarettes, chewing tobacco, and vaping devices, such as e-cigarettes. If you need help quitting, ask your health care provider. Monitor your blood pressure at home as told by your health care provider. Keep all follow-up visits. This is important. Medicines Take over-the-counter and prescription medicines only as told by your health care provider. Follow directions carefully. Blood pressure medicines must be taken as prescribed. Do not skip doses of blood pressure medicine. Doing this puts you at risk for problems and can make the medicine less effective. Ask your health care provider about side effects or reactions to medicines that you should watch for. Contact a health care provider if you: Think you are having a reaction to a medicine  you are taking. Have headaches that keep coming back (recurring). Feel dizzy. Have swelling in your ankles. Have trouble with your vision. Get help right away if you: Develop a severe headache or confusion. Have unusual weakness or numbness. Feel faint. Have severe pain in your chest or abdomen. Vomit repeatedly. Have trouble breathing. These symptoms may be an emergency. Get help right away. Call 911. Do not wait to see if the symptoms will go away. Do not drive yourself to the hospital. Summary Hypertension is when the force of blood pumping through your arteries is too strong. If this condition is not controlled, it may put you at risk for serious complications. Your personal target blood pressure may vary depending on your medical conditions, your age, and other factors. For most people, a normal blood pressure is less than 120/80. Hypertension is treated with lifestyle changes, medicines, or a combination of both. Lifestyle changes include losing weight, eating a healthy, low-sodium diet, exercising more, and limiting alcohol. This information is not intended to replace advice given to you by your health care provider. Make sure you discuss any questions you have with your health care provider. Document Revised: 07/29/2021 Document Reviewed: 07/29/2021 Elsevier Patient Education  2023 [employer].     Wren  Alvy Bimler, MD [facility]

## 2023-02-24 NOTE — Telephone Encounter (Signed)
Returned call to patient. Pt states that he was eating chicken wings yesterday and it got stuck in his throat. This led patient to go to the ED, and caused him to vomit for about 45 minutes. Patient is scheduled for an office visit in July with Alcide Evener, NP. No sooner office visits at this time. Patient states that he benefited from EGD with dilation in the past wondering if he can be scheduled for direct procedure in the interim? Please advise, thanks.

## 2023-02-24 NOTE — Assessment & Plan Note (Signed)
Advised to stay well-hydrated and avoid NSAIDs. ?

## 2023-02-24 NOTE — Assessment & Plan Note (Signed)
Stable today.  Able to eat and drink however only eating soft food along with fluids. Wife contacted GI doctor today to schedule upper endoscopy. Appointment still pending.

## 2023-02-24 NOTE — Patient Instructions (Signed)
Hypertension, Adult High blood pressure (hypertension) is when the force of blood pumping through the arteries is too strong. The arteries are the blood vessels that carry blood from the heart throughout the body. Hypertension forces the heart to work harder to pump blood and may cause arteries to become narrow or stiff. Untreated or uncontrolled hypertension can lead to a heart attack, heart failure, a stroke, kidney disease, and other problems. A blood pressure reading consists of a higher number over a lower number. Ideally, your blood pressure should be below 120/80. The first ("top") number is called the systolic pressure. It is a measure of the pressure in your arteries as your heart beats. The second ("bottom") number is called the diastolic pressure. It is a measure of the pressure in your arteries as the heart relaxes. What are the causes? The exact cause of this condition is not known. There are some conditions that result in high blood pressure. What increases the risk? Certain factors may make you more likely to develop high blood pressure. Some of these risk factors are under your control, including: Smoking. Not getting enough exercise or physical activity. Being overweight. Having too much fat, sugar, calories, or salt (sodium) in your diet. Drinking too much alcohol. Other risk factors include: Having a personal history of heart disease, diabetes, high cholesterol, or kidney disease. Stress. Having a family history of high blood pressure and high cholesterol. Having obstructive sleep apnea. Age. The risk increases with age. What are the signs or symptoms? High blood pressure may not cause symptoms. Very high blood pressure (hypertensive crisis) may cause: Headache. Fast or irregular heartbeats (palpitations). Shortness of breath. Nosebleed. Nausea and vomiting. Vision changes. Severe chest pain, dizziness, and seizures. How is this diagnosed? This condition is diagnosed by  measuring your blood pressure while you are seated, with your arm resting on a flat surface, your legs uncrossed, and your feet flat on the floor. The cuff of the blood pressure monitor will be placed directly against the skin of your upper arm at the level of your heart. Blood pressure should be measured at least twice using the same arm. Certain conditions can cause a difference in blood pressure between your right and left arms. If you have a high blood pressure reading during one visit or you have normal blood pressure with other risk factors, you may be asked to: Return on a different day to have your blood pressure checked again. Monitor your blood pressure at home for 1 week or longer. If you are diagnosed with hypertension, you may have other blood or imaging tests to help your health care provider understand your overall risk for other conditions. How is this treated? This condition is treated by making healthy lifestyle changes, such as eating healthy foods, exercising more, and reducing your alcohol intake. You may be referred for counseling on a healthy diet and physical activity. Your health care provider may prescribe medicine if lifestyle changes are not enough to get your blood pressure under control and if: Your systolic blood pressure is above 130. Your diastolic blood pressure is above 80. Your personal target blood pressure may vary depending on your medical conditions, your age, and other factors. Follow these instructions at home: Eating and drinking  Eat a diet that is high in fiber and potassium, and low in sodium, added sugar, and fat. An example of this eating plan is called the DASH diet. DASH stands for Dietary Approaches to Stop Hypertension. To eat this way: Eat   plenty of fresh fruits and vegetables. Try to fill one half of your plate at each meal with fruits and vegetables. Eat whole grains, such as whole-wheat pasta, brown rice, or whole-grain bread. Fill about one  fourth of your plate with whole grains. Eat or drink low-fat dairy products, such as skim milk or low-fat yogurt. Avoid fatty cuts of meat, processed or cured meats, and poultry with skin. Fill about one fourth of your plate with lean proteins, such as fish, chicken without skin, beans, eggs, or tofu. Avoid pre-made and processed foods. These tend to be higher in sodium, added sugar, and fat. Reduce your daily sodium intake. Many people with hypertension should eat less than 1,500 mg of sodium a day. Do not drink alcohol if: Your health care provider tells you not to drink. You are pregnant, may be pregnant, or are planning to become pregnant. If you drink alcohol: Limit how much you have to: 0-1 drink a day for women. 0-2 drinks a day for men. Know how much alcohol is in your drink. In the [place] one drink equals one 12 oz bottle of beer (355 mL), one 5 oz glass of wine (148 mL), or one 1 oz glass of hard liquor (44 mL). Lifestyle  Work with your health care provider to maintain a healthy body weight or to lose weight. Ask what an ideal weight is for you. Get at least 30 minutes of exercise that causes your heart to beat faster (aerobic exercise) most days of the week. Activities may include walking, swimming, or biking. Include exercise to strengthen your muscles (resistance exercise), such as Pilates or lifting weights, as part of your weekly exercise routine. Try to do these types of exercises for 30 minutes at least 3 days a week. Do not use any products that contain nicotine or tobacco. These products include cigarettes, chewing tobacco, and vaping devices, such as e-cigarettes. If you need help quitting, ask your health care provider. Monitor your blood pressure at home as told by your health care provider. Keep all follow-up visits. This is important. Medicines Take over-the-counter and prescription medicines only as told by your health care provider. Follow directions carefully. Blood  pressure medicines must be taken as prescribed. Do not skip doses of blood pressure medicine. Doing this puts you at risk for problems and can make the medicine less effective. Ask your health care provider about side effects or reactions to medicines that you should watch for. Contact a health care provider if you: Think you are having a reaction to a medicine you are taking. Have headaches that keep coming back (recurring). Feel dizzy. Have swelling in your ankles. Have trouble with your vision. Get help right away if you: Develop a severe headache or confusion. Have unusual weakness or numbness. Feel faint. Have severe pain in your chest or abdomen. Vomit repeatedly. Have trouble breathing. These symptoms may be an emergency. Get help right away. Call 911. Do not wait to see if the symptoms will go away. Do not drive yourself to the hospital. Summary Hypertension is when the force of blood pumping through your arteries is too strong. If this condition is not controlled, it may put you at risk for serious complications. Your personal target blood pressure may vary depending on your medical conditions, your age, and other factors. For most people, a normal blood pressure is less than 120/80. Hypertension is treated with lifestyle changes, medicines, or a combination of both. Lifestyle changes include losing weight, eating a healthy,   low-sodium diet, exercising more, and limiting alcohol. This information is not intended to replace advice given to you by your health care provider. Make sure you discuss any questions you have with your health care provider. Document Revised: 07/29/2021 Document Reviewed: 07/29/2021 Elsevier Patient Education  2023 Elsevier Inc.  

## 2023-02-24 NOTE — Assessment & Plan Note (Signed)
Stable chronic medical condition Continue rosuvastatin 20 mg daily Was mistakenly also taking atorvastatin.  Advised to stop it.

## 2023-02-24 NOTE — Assessment & Plan Note (Signed)
Uncontrolled hypertension. Recommend to stop lisinopril and hydrochlorothiazide and start valsartan HCT 320-12.5 mg daily Cardiovascular risks associated with uncontrolled hypertension discussed Advised to monitor blood pressure readings at home daily for the next several weeks and keep a log.  Advised to contact the office if numbers persistently abnormal. Follow-up in 3 months

## 2023-02-24 NOTE — Assessment & Plan Note (Signed)
Stable.  Continues on phenobarbital 64.8 mg 1-1/2 tablets at bedtime

## 2023-02-24 NOTE — Telephone Encounter (Signed)
Thanks [employer].  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  If he is otherwise feeling well without any cardiopulmonary symptoms and no significant changes to his medical history, I think we can directly book him for an EGD with dilation at the [facility].  Thank you.

## 2023-02-25 NOTE — Telephone Encounter (Signed)
Called and spoke with patient. Pt has been scheduled for direct EGD in the [facility] on Monday, 03/08/23 at 2 pm. Patient has been advised that he will need to arrive by 1 pm with a care partner. Pt will stop by the office today or tomorrow to pick up written instructions and sign consent forms since he does not have MyChart. Pt verbalized understanding and had no concerns at the end of the call.   Ambulatory referral to GI in [facility]. Message sent to pre certification team.  EGD instructions placed at 2nd floor receptionist desk.

## 2023-02-25 NOTE — Telephone Encounter (Signed)
Patient signed consent forms and picked up EGD instructions.

## 2023-03-01 ENCOUNTER — Encounter: Payer: Self-pay | Admitting: Certified Registered Nurse Anesthetist

## 2023-03-02 DIAGNOSIS — M9901 Segmental and somatic dysfunction of cervical region: Secondary | ICD-10-CM | POA: Diagnosis not present

## 2023-03-02 DIAGNOSIS — M9902 Segmental and somatic dysfunction of thoracic region: Secondary | ICD-10-CM | POA: Diagnosis not present

## 2023-03-02 DIAGNOSIS — M9903 Segmental and somatic dysfunction of lumbar region: Secondary | ICD-10-CM | POA: Diagnosis not present

## 2023-03-02 DIAGNOSIS — M545 Low back pain, unspecified: Secondary | ICD-10-CM | POA: Diagnosis not present

## 2023-03-02 DIAGNOSIS — M542 Cervicalgia: Secondary | ICD-10-CM | POA: Diagnosis not present

## 2023-03-02 DIAGNOSIS — M62838 Other muscle spasm: Secondary | ICD-10-CM | POA: Diagnosis not present

## 2023-03-02 DIAGNOSIS — M546 Pain in thoracic spine: Secondary | ICD-10-CM | POA: Diagnosis not present

## 2023-03-02 DIAGNOSIS — M62830 Muscle spasm of back: Secondary | ICD-10-CM | POA: Diagnosis not present

## 2023-03-03 DIAGNOSIS — M62838 Other muscle spasm: Secondary | ICD-10-CM | POA: Diagnosis not present

## 2023-03-03 DIAGNOSIS — M62830 Muscle spasm of back: Secondary | ICD-10-CM | POA: Diagnosis not present

## 2023-03-03 DIAGNOSIS — M546 Pain in thoracic spine: Secondary | ICD-10-CM | POA: Diagnosis not present

## 2023-03-03 DIAGNOSIS — M542 Cervicalgia: Secondary | ICD-10-CM | POA: Diagnosis not present

## 2023-03-03 DIAGNOSIS — M9902 Segmental and somatic dysfunction of thoracic region: Secondary | ICD-10-CM | POA: Diagnosis not present

## 2023-03-03 DIAGNOSIS — M545 Low back pain, unspecified: Secondary | ICD-10-CM | POA: Diagnosis not present

## 2023-03-03 DIAGNOSIS — M9903 Segmental and somatic dysfunction of lumbar region: Secondary | ICD-10-CM | POA: Diagnosis not present

## 2023-03-03 DIAGNOSIS — M9901 Segmental and somatic dysfunction of cervical region: Secondary | ICD-10-CM | POA: Diagnosis not present

## 2023-03-04 DIAGNOSIS — M9902 Segmental and somatic dysfunction of thoracic region: Secondary | ICD-10-CM | POA: Diagnosis not present

## 2023-03-04 DIAGNOSIS — M9903 Segmental and somatic dysfunction of lumbar region: Secondary | ICD-10-CM | POA: Diagnosis not present

## 2023-03-04 DIAGNOSIS — M546 Pain in thoracic spine: Secondary | ICD-10-CM | POA: Diagnosis not present

## 2023-03-04 DIAGNOSIS — M9901 Segmental and somatic dysfunction of cervical region: Secondary | ICD-10-CM | POA: Diagnosis not present

## 2023-03-04 DIAGNOSIS — M545 Low back pain, unspecified: Secondary | ICD-10-CM | POA: Diagnosis not present

## 2023-03-04 DIAGNOSIS — M62830 Muscle spasm of back: Secondary | ICD-10-CM | POA: Diagnosis not present

## 2023-03-04 DIAGNOSIS — M62838 Other muscle spasm: Secondary | ICD-10-CM | POA: Diagnosis not present

## 2023-03-04 DIAGNOSIS — M542 Cervicalgia: Secondary | ICD-10-CM | POA: Diagnosis not present

## 2023-03-08 ENCOUNTER — Ambulatory Visit (AMBULATORY_SURGERY_CENTER): Payer: Medicare Other | Admitting: Gastroenterology

## 2023-03-08 ENCOUNTER — Encounter: Payer: Self-pay | Admitting: Gastroenterology

## 2023-03-08 VITALS — BP 145/95 | HR 68 | Temp 97.3°F | Resp 21 | Ht 67.0 in | Wt 235.0 lb

## 2023-03-08 DIAGNOSIS — R1310 Dysphagia, unspecified: Secondary | ICD-10-CM | POA: Diagnosis not present

## 2023-03-08 DIAGNOSIS — K222 Esophageal obstruction: Secondary | ICD-10-CM | POA: Diagnosis not present

## 2023-03-08 DIAGNOSIS — K317 Polyp of stomach and duodenum: Secondary | ICD-10-CM | POA: Diagnosis not present

## 2023-03-08 MED ORDER — SODIUM CHLORIDE 0.9 % IV SOLN
500.0000 mL | Freq: Once | INTRAVENOUS | Status: DC
Start: 2023-03-08 — End: 2023-03-08

## 2023-03-08 NOTE — Progress Notes (Signed)
Called to room to assist during endoscopic procedure.  Patient ID and intended procedure confirmed with present staff. Received instructions for my participation in the procedure from the performing physician.  

## 2023-03-08 NOTE — Progress Notes (Signed)
[facility] History and Physical   Primary Care Physician:  Georgina Quint, MD   Reason for Procedure:   dysphagia  Plan:    EGD with possible dilation     HPI: Wesley Mccarthy is a 72 y.o. male  here for EGD with possible dilation for symptoms of dysphagia. 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 Reports the dilation helped at that time. Symptoms of dysphagia have recurred recently, he called in about it asking for another dilation. GERD not bothering him. Otherwise feels well without any cardiopulmonary symptoms.   I have discussed risks / benefits of anesthesia and endoscopic procedure with [facility] and they wish to proceed with the exams as outlined today.    Past Medical History:  Diagnosis Date   Allergy    Arthritis    CKD (chronic kidney disease) stage 3, GFR 30-59 ml/min (HCC)    Epilepsy, unspecified, not intractable, without status epilepticus (HCC)    Esophageal obstruction    GERD (gastroesophageal reflux disease)    Hyperlipidemia    Hypertension    Osteoarthritis 2018   knee   Seizures (HCC)     Past Surgical History:  Procedure Laterality Date   APPENDECTOMY     TONSILLECTOMY     UPPER GASTROINTESTINAL ENDOSCOPY      Prior to Admission medications   Medication Sig Start Date End Date Taking? Authorizing Provider  allopurinol (ZYLOPRIM) 100 MG tablet Take 1 tablet (100 mg total) by mouth daily. 11/03/22  Yes Georgina Quint, MD  omeprazole (PRILOSEC) 20 MG capsule TAKE 1 CAPSULE BY MOUTH DAILY 02/16/23  Yes Arnaldo Natal, NP  PHENobarbital (LUMINAL) 64.8 MG tablet TAKE ONE AND ONE-HALF (1 & 1/2) TABLET BY MOUTH AT BEDTIME 07/13/22  Yes Sagardia, Eilleen Kempf, MD  Potassium Chloride ER 20 MEQ TBCR Take 1 tablet by mouth daily. 01/11/23  Yes [provider]  rosuvastatin (CRESTOR) 20 MG tablet TAKE ONE TABLET BY MOUTH DAILY 12/22/22  Yes Sagardia, Eilleen Kempf, MD  valsartan-hydrochlorothiazide (DIOVAN-HCT)  320-12.5 MG tablet Take 1 tablet by mouth daily. 02/24/23  Yes Sagardia, Eilleen Kempf, MD  cyclobenzaprine (FLEXERIL) 10 MG tablet Take 1 tablet (10 mg total) by mouth at bedtime. 02/08/23   Georgina Quint, MD  polyethylene glycol powder (GLYCOLAX/MIRALAX) powder Take 17 g by mouth 2 (two) times daily as needed. 11/22/17   Doristine Bosworth, MD    Current Outpatient Medications  Medication Sig Dispense Refill   allopurinol (ZYLOPRIM) 100 MG tablet Take 1 tablet (100 mg total) by mouth daily. 90 tablet 1   omeprazole (PRILOSEC) 20 MG capsule TAKE 1 CAPSULE BY MOUTH DAILY 60 capsule 0   PHENobarbital (LUMINAL) 64.8 MG tablet TAKE ONE AND ONE-HALF (1 & 1/2) TABLET BY MOUTH AT BEDTIME 90 tablet 3   Potassium Chloride ER 20 MEQ TBCR Take 1 tablet by mouth daily.     rosuvastatin (CRESTOR) 20 MG tablet TAKE ONE TABLET BY MOUTH DAILY 90 tablet 3   valsartan-hydrochlorothiazide (DIOVAN-HCT) 320-12.5 MG tablet Take 1 tablet by mouth daily. 90 tablet 3   cyclobenzaprine (FLEXERIL) 10 MG tablet Take 1 tablet (10 mg total) by mouth at bedtime. 30 tablet 0   polyethylene glycol powder (GLYCOLAX/MIRALAX) powder Take 17 g by mouth 2 (two) times daily as needed. 3350 g 1   Current Facility-Administered Medications  Medication Dose Route Frequency Provider Last Rate Last Admin   0.9 %  sodium chloride infusion  500 mL Intravenous Once Ileene Patrick  P, MD        Allergies as of 03/08/2023 - Review Complete 03/08/2023  Allergen Reaction Noted   Norvasc [amlodipine besylate]  11/11/2011   Penicillins  08/11/2011    Family History  Problem Relation Age of Onset   Heart disease Mother        Had a CABG   Colon cancer Father    Seizures Daughter    Diabetes Paternal Aunt    Stomach cancer Neg Hx    Esophageal cancer Neg Hx    Rectal cancer Neg Hx    Liver cancer Neg Hx     Social History   Socioeconomic History   Marital status: Married    Spouse name: Not on file   Number of children: 3    Years of education: Not on file   Highest education level: Not on file  Occupational History   Occupation: Designer, jewellery   Occupation: [employer]  Tobacco Use   Smoking status: Never   Smokeless tobacco: Never  Vaping Use   Vaping Use: Never used  Substance and Sexual Activity   Alcohol use: Yes    Comment: 1-2 per year   Drug use: No   Sexual activity: Yes    Birth control/protection: None  Other Topics Concern   Not on file  Social History Narrative   Married. Education: [employer].    Social Determinants of Health   Financial Resource Strain: Low Risk  (10/20/2022)   Overall Financial Resource Strain (CARDIA)    Difficulty of Paying Living Expenses: Not hard at all  Food Insecurity: No Food Insecurity (10/20/2022)   Hunger Vital Sign    Worried About Running Out of Food in the Last Year: Never true    Ran Out of Food in the Last Year: Never true  Transportation Needs: No Transportation Needs (10/20/2022)   PRAPARE - Administrator, Civil Service (Medical): No    Lack of Transportation (Non-Medical): No  Physical Activity: Inactive (10/20/2022)   Exercise Vital Sign    Days of Exercise per Week: 0 days    Minutes of Exercise per Session: 0 min  Stress: No Stress Concern Present (10/20/2022)   [employer] of Occupational Health - Occupational Stress Questionnaire    Feeling of Stress : Not at all  Social Connections: Socially Integrated (10/20/2022)   Social Connection and Isolation Panel [NHANES]    Frequency of Communication with Friends and Family: More than three times a week    Frequency of Social Gatherings with Friends and Family: More than three times a week    Attends Religious Services: More than 4 times per year    Active Member of [employer] or Organizations: Yes    Attends Engineer, structural: More than 4 times per year    Marital Status: Married  Catering manager Violence: Not At Risk (10/20/2022)   Humiliation, Afraid, Rape,  and Kick questionnaire    Fear of Current or Ex-Partner: No    Emotionally Abused: No    Physically Abused: No    Sexually Abused: No    Review of Systems: All other review of systems negative except as mentioned in the HPI.  Physical Exam: Vital signs BP (!) 145/85   Pulse 74   Temp (!) 97.3 F (36.3 C) (Temporal)   Ht 5\' 7"  (1.702 m)   Wt 235 lb (106.6 kg)   SpO2 98%   BMI 36.81 kg/m   General:   Alert,  Well-developed, pleasant  and cooperative in NAD Lungs:  Clear throughout to auscultation.   Heart:  Regular rate and rhythm Abdomen:  Soft, nontender and nondistended.   Neuro/Psych:  Alert and cooperative. Normal mood and affect. A and O x 3  Harlin Rain, MD [facility]

## 2023-03-08 NOTE — Progress Notes (Signed)
1407 Robinul 0.1 mg IV given due large amount of secretions upon assessment.  MD made aware, vss  ?

## 2023-03-08 NOTE — Progress Notes (Deleted)
Pt's states no medical or surgical changes since previsit or office visit. 

## 2023-03-08 NOTE — Progress Notes (Signed)
Report given to PACU, vss 

## 2023-03-08 NOTE — Op Note (Signed)
[facility] Patient Name: Wesley Mccarthy Procedure Date: 03/08/2023 2:07 PM MRN: [id number] Endoscopist: Viviann Spare P. Adela Lank , MD, [id number] Age: 72 Referring MD:  Date of Birth: [birth year] Gender: Male Account #: [id number] Procedure:                Upper GI endoscopy Indications:              Dysphagia - history of GEJ stricture treated with                            balloon dilation in the past with benefit, here for                            recurrent dysphagia Medicines:                Monitored Anesthesia Care Procedure:                Pre-Anesthesia Assessment:                           - Prior to the procedure, a History and Physical                            was performed, and patient medications and                            allergies were reviewed. The patient's tolerance of                            previous anesthesia was also reviewed. The risks                            and benefits of the procedure and the sedation                            options and risks were discussed with the patient.                            All questions were answered, and informed consent                            was obtained. Prior Anticoagulants: The patient has                            taken no anticoagulant or antiplatelet agents. ASA                            Grade Assessment: III - A patient with severe                            systemic disease. After reviewing the risks and                            benefits, the patient was deemed in satisfactory  condition to undergo the procedure.                           After obtaining informed consent, the endoscope was                            passed under direct vision. Throughout the                            procedure, the patient's blood pressure, pulse, and                            oxygen saturations were monitored continuously. The                            GIF [id number] #[id number] was  introduced through the                            mouth, and advanced to the second part of duodenum.                            The upper GI endoscopy was accomplished without                            difficulty. The patient tolerated the procedure                            well. Scope In: Scope Out: Findings:                 Esophagogastric landmarks were identified: the                            Z-line was found at 36 cm, the gastroesophageal                            junction was found at 36 cm and the upper extent of                            the gastric folds was found at 40 cm from the                            incisors.                           A 4 cm hiatal hernia was present.                           One benign-appearing, intrinsic mild stenosis was                            found at the GEJ, < 1cm in length. A TTS dilator                            was passed  through the scope. Dilation with an                            18-19-20 mm balloon dilator was performed to 19 mm                            after which appropriate dilation affect was noted.                           The exam of the esophagus was otherwise normal.                           A few small sessile polyps were found in the                            gastric body grossly consistent with benign fundic                            gland polyps.                           The exam of the stomach was otherwise normal.                           The examined duodenum was normal. Complications:            No immediate complications. Estimated blood loss:                            Minimal. Estimated Blood Loss:     Estimated blood loss was minimal. Impression:               - Esophagogastric landmarks identified.                           - 4 cm hiatal hernia.                           - Benign-appearing esophageal stenosis. Dilated to                            19mm with good result.                            - A few gastric polyps. Benign fundic gland polyps.                           - Normal stomach otherwise.                           - Normal examined duodenum. Recommendation:           - Patient has a contact number available for                            emergencies. The signs and symptoms of potential  delayed complications were discussed with the                            patient. Return to normal activities tomorrow.                            Written discharge instructions were provided to the                            patient.                           - Advance diet as tolerated.                           - Continue present medications.                           - Follow up as needed if symptoms recur. Viviann Spare P. Taneshia Lorence, MD 03/08/2023 2:27:16 PM This report has been signed electronically.

## 2023-03-08 NOTE — Patient Instructions (Signed)
Impression/Recommendations:  Hiatal hernia and dilation diet handouts given to patient.  Advance diet as tolerated.  Continue present medications. Follow-up as needed if symptoms recur.  YOU HAD AN ENDOSCOPIC PROCEDURE TODAY AT THE [facility]:   Refer to the procedure report that was given to you for any specific questions about what was found during the examination.  If the procedure report does not answer your questions, please call your gastroenterologist to clarify.  If you requested that your care partner not be given the details of your procedure findings, then the procedure report has been included in a sealed envelope for you to review at your convenience later.  YOU SHOULD EXPECT: Some feelings of bloating in the abdomen. Passage of more gas than usual.  Walking can help get rid of the air that was put into your GI tract during the procedure and reduce the bloating. If you had a lower endoscopy (such as a colonoscopy or flexible sigmoidoscopy) you may notice spotting of blood in your stool or on the toilet paper. If you underwent a bowel prep for your procedure, you may not have a normal bowel movement for a few days.  Please Note:  You might notice some irritation and congestion in your nose or some drainage.  This is from the oxygen used during your procedure.  There is no need for concern and it should clear up in a day or so.  SYMPTOMS TO REPORT IMMEDIATELY: Following upper endoscopy (EGD)  Vomiting of blood or coffee ground material  New chest pain or pain under the shoulder blades  Painful or persistently difficult swallowing  New shortness of breath  Fever of 100F or higher  Black, tarry-looking stools  For urgent or emergent issues, a gastroenterologist can be reached at any hour by calling [phone]. Do not use MyChart messaging for urgent concerns.    DIET:  We do recommend a small meal at first, but then you may proceed to your regular diet.  Drink  plenty of fluids but you should avoid alcoholic beverages for 24 hours.  ACTIVITY:  You should plan to take it easy for the rest of today and you should NOT DRIVE or use heavy machinery until tomorrow (because of the sedation medicines used during the test).    FOLLOW UP: Our staff will call the number listed on your records the next business day following your procedure.  We will call around 7:15- 8:00 am to check on you and address any questions or concerns that you may have regarding the information given to you following your procedure. If we do not reach you, we will leave a message.     If any biopsies were taken you will be contacted by phone or by letter within the next 1-3 weeks.  Please call us at [phone] if you have not heard about the biopsies in 3 weeks.    SIGNATURES/CONFIDENTIALITY: You and/or your care partner have signed paperwork which will be entered into your electronic medical record.  These signatures attest to the fact that that the information above on your After Visit Summary has been reviewed and is understood.  Full responsibility of the confidentiality of this discharge information lies with you and/or your care-partner.

## 2023-03-09 ENCOUNTER — Telehealth: Payer: Self-pay | Admitting: *Deleted

## 2023-03-09 DIAGNOSIS — M62838 Other muscle spasm: Secondary | ICD-10-CM | POA: Diagnosis not present

## 2023-03-09 DIAGNOSIS — M9903 Segmental and somatic dysfunction of lumbar region: Secondary | ICD-10-CM | POA: Diagnosis not present

## 2023-03-09 DIAGNOSIS — M62830 Muscle spasm of back: Secondary | ICD-10-CM | POA: Diagnosis not present

## 2023-03-09 DIAGNOSIS — M9902 Segmental and somatic dysfunction of thoracic region: Secondary | ICD-10-CM | POA: Diagnosis not present

## 2023-03-09 DIAGNOSIS — M542 Cervicalgia: Secondary | ICD-10-CM | POA: Diagnosis not present

## 2023-03-09 DIAGNOSIS — M9901 Segmental and somatic dysfunction of cervical region: Secondary | ICD-10-CM | POA: Diagnosis not present

## 2023-03-09 DIAGNOSIS — M546 Pain in thoracic spine: Secondary | ICD-10-CM | POA: Diagnosis not present

## 2023-03-09 DIAGNOSIS — M545 Low back pain, unspecified: Secondary | ICD-10-CM | POA: Diagnosis not present

## 2023-03-09 NOTE — Telephone Encounter (Signed)
  Follow up Call-     03/08/2023    1:01 PM 10/17/2021    9:23 AM  Call back number  Post procedure Call Back phone  # [phone]  Permission to leave phone message Yes Yes    LMOM

## 2023-03-11 DIAGNOSIS — M9902 Segmental and somatic dysfunction of thoracic region: Secondary | ICD-10-CM | POA: Diagnosis not present

## 2023-03-11 DIAGNOSIS — M62838 Other muscle spasm: Secondary | ICD-10-CM | POA: Diagnosis not present

## 2023-03-11 DIAGNOSIS — M546 Pain in thoracic spine: Secondary | ICD-10-CM | POA: Diagnosis not present

## 2023-03-11 DIAGNOSIS — M545 Low back pain, unspecified: Secondary | ICD-10-CM | POA: Diagnosis not present

## 2023-03-11 DIAGNOSIS — M9903 Segmental and somatic dysfunction of lumbar region: Secondary | ICD-10-CM | POA: Diagnosis not present

## 2023-03-11 DIAGNOSIS — M9901 Segmental and somatic dysfunction of cervical region: Secondary | ICD-10-CM | POA: Diagnosis not present

## 2023-03-11 DIAGNOSIS — M542 Cervicalgia: Secondary | ICD-10-CM | POA: Diagnosis not present

## 2023-03-11 DIAGNOSIS — M62830 Muscle spasm of back: Secondary | ICD-10-CM | POA: Diagnosis not present

## 2023-03-15 DIAGNOSIS — M542 Cervicalgia: Secondary | ICD-10-CM | POA: Diagnosis not present

## 2023-03-15 DIAGNOSIS — M62830 Muscle spasm of back: Secondary | ICD-10-CM | POA: Diagnosis not present

## 2023-03-15 DIAGNOSIS — M545 Low back pain, unspecified: Secondary | ICD-10-CM | POA: Diagnosis not present

## 2023-03-15 DIAGNOSIS — M9903 Segmental and somatic dysfunction of lumbar region: Secondary | ICD-10-CM | POA: Diagnosis not present

## 2023-03-15 DIAGNOSIS — M9901 Segmental and somatic dysfunction of cervical region: Secondary | ICD-10-CM | POA: Diagnosis not present

## 2023-03-15 DIAGNOSIS — M62838 Other muscle spasm: Secondary | ICD-10-CM | POA: Diagnosis not present

## 2023-03-15 DIAGNOSIS — M546 Pain in thoracic spine: Secondary | ICD-10-CM | POA: Diagnosis not present

## 2023-03-15 DIAGNOSIS — M9902 Segmental and somatic dysfunction of thoracic region: Secondary | ICD-10-CM | POA: Diagnosis not present

## 2023-03-23 DIAGNOSIS — N2581 Secondary hyperparathyroidism of renal origin: Secondary | ICD-10-CM | POA: Diagnosis not present

## 2023-03-23 DIAGNOSIS — N1832 Chronic kidney disease, stage 3b: Secondary | ICD-10-CM | POA: Diagnosis not present

## 2023-03-23 DIAGNOSIS — D631 Anemia in chronic kidney disease: Secondary | ICD-10-CM | POA: Diagnosis not present

## 2023-03-23 DIAGNOSIS — I129 Hypertensive chronic kidney disease with stage 1 through stage 4 chronic kidney disease, or unspecified chronic kidney disease: Secondary | ICD-10-CM | POA: Diagnosis not present

## 2023-03-23 DIAGNOSIS — E876 Hypokalemia: Secondary | ICD-10-CM | POA: Diagnosis not present

## 2023-03-24 LAB — LAB REPORT - SCANNED
Albumin, Urine POC: 1766.2
Albumin/Creatinine Ratio, Urine, POC: 882
Creatinine, POC: 200.3 mg/dL
EGFR: 37

## 2023-04-05 ENCOUNTER — Other Ambulatory Visit: Payer: Self-pay | Admitting: Emergency Medicine

## 2023-04-05 DIAGNOSIS — G40909 Epilepsy, unspecified, not intractable, without status epilepticus: Secondary | ICD-10-CM

## 2023-04-05 MED ORDER — PHENOBARBITAL 64.8 MG PO TABS
ORAL_TABLET | ORAL | 0 refills | Status: AC
Start: 2023-04-05 — End: ?

## 2023-04-05 NOTE — Telephone Encounter (Signed)
Rx printed fax over to [facility].Marland KitchenRaechel Chute

## 2023-04-05 NOTE — Addendum Note (Signed)
Addended by: Deatra James on: 04/05/2023 12:07 PM   Modules accepted: Orders

## 2023-04-15 DIAGNOSIS — R799 Abnormal finding of blood chemistry, unspecified: Secondary | ICD-10-CM | POA: Diagnosis not present

## 2023-04-15 DIAGNOSIS — N1832 Chronic kidney disease, stage 3b: Secondary | ICD-10-CM | POA: Diagnosis not present

## 2023-04-28 ENCOUNTER — Ambulatory Visit: Payer: Medicare Other | Admitting: Nurse Practitioner

## 2023-05-05 ENCOUNTER — Other Ambulatory Visit: Payer: Self-pay | Admitting: Emergency Medicine

## 2023-05-07 ENCOUNTER — Ambulatory Visit: Payer: Medicare Other | Admitting: Physician Assistant

## 2023-05-07 ENCOUNTER — Encounter: Payer: Self-pay | Admitting: Physician Assistant

## 2023-05-07 VITALS — BP 140/84 | HR 80 | Ht 67.0 in | Wt 235.2 lb

## 2023-05-07 DIAGNOSIS — K2970 Gastritis, unspecified, without bleeding: Secondary | ICD-10-CM | POA: Diagnosis not present

## 2023-05-07 DIAGNOSIS — Z8719 Personal history of other diseases of the digestive system: Secondary | ICD-10-CM | POA: Diagnosis not present

## 2023-05-07 DIAGNOSIS — K219 Gastro-esophageal reflux disease without esophagitis: Secondary | ICD-10-CM

## 2023-05-07 MED ORDER — OMEPRAZOLE 20 MG PO CPDR
20.0000 mg | DELAYED_RELEASE_CAPSULE | Freq: Every day | ORAL | 3 refills | Status: DC
Start: 2023-05-07 — End: 2024-04-19

## 2023-05-07 NOTE — Progress Notes (Signed)
Chief Complaint: Follow-up dysphagia  HPI:    Wesley Mccarthy is a 72 year old male, known to Dr. Adela Lank, with a past medical history as listed below including CKD, epilepsy and esophageal obstruction with history of GERD, who presents to clinic today for follow-up of dysphagia.    04/22/2020 colonoscopy with diverticulosis in the transverse colon, 5 mm polyp in the sigmoid colon and 2 to 3-4 mm polyps at the rectosigmoid colon, internal hemorrhoids.  Repeat recommended in 5 years.  Pathology showed hyperplastic polyps.    09/03/2021 office visit for dysphagia    03/08/2023 EGD with a 4 cm hiatal hernia, benign-appearing esophageal stenosis dilated to 19 mm, few benign fundic gland polyps and otherwise normal.    Today, patient presents to clinic and tells me that he is doing well, after last dilation he is now able to swallow again.  Continues on Omeprazole 20 mg daily and would like a refill.  No other new complaints or concerns.    Denies fever, chills, weight loss, abdominal pain, heartburn or reflux.  Past Medical History:  Diagnosis Date   Allergy    Arthritis    CKD (chronic kidney disease) stage 3, GFR 30-59 ml/min (HCC)    Epilepsy, unspecified, not intractable, without status epilepticus (HCC)    Esophageal obstruction    GERD (gastroesophageal reflux disease)    Hyperlipidemia    Hypertension    Osteoarthritis 2018   knee   Seizures (HCC)     Past Surgical History:  Procedure Laterality Date   APPENDECTOMY     TONSILLECTOMY     UPPER GASTROINTESTINAL ENDOSCOPY      Current Outpatient Medications  Medication Sig Dispense Refill   allopurinol (ZYLOPRIM) 100 MG tablet Take 1 tablet (100 mg total) by mouth daily. 90 tablet 1   cyclobenzaprine (FLEXERIL) 10 MG tablet Take 1 tablet (10 mg total) by mouth at bedtime. 30 tablet 0   omeprazole (PRILOSEC) 20 MG capsule TAKE 1 CAPSULE BY MOUTH DAILY 60 capsule 0   PHENobarbital (LUMINAL) 64.8 MG tablet TAKE ONE AND A HALF (1.5)  TABLETS BY MOUTH EVERY NIGHT AT BEDTIME 90 tablet 0   polyethylene glycol powder (GLYCOLAX/MIRALAX) powder Take 17 g by mouth 2 (two) times daily as needed. 3350 g 1   Potassium Chloride ER 20 MEQ TBCR Take 1 tablet by mouth daily.     rosuvastatin (CRESTOR) 20 MG tablet TAKE ONE TABLET BY MOUTH DAILY 90 tablet 3   valsartan-hydrochlorothiazide (DIOVAN-HCT) 320-12.5 MG tablet Take 1 tablet by mouth daily. 90 tablet 3   No current facility-administered medications for this visit.    Allergies as of 05/07/2023 - Review Complete 03/08/2023  Allergen Reaction Noted   Norvasc [amlodipine besylate]  11/11/2011   Penicillins  08/11/2011    Family History  Problem Relation Age of Onset   Heart disease Mother        Had a CABG   Colon cancer Father    Seizures Daughter    Diabetes Paternal Aunt    Stomach cancer Neg Hx    Esophageal cancer Neg Hx    Rectal cancer Neg Hx    Liver cancer Neg Hx     Social History   Socioeconomic History   Marital status: Married    Spouse name: Not on file   Number of children: 3   Years of education: Not on file   Highest education level: Not on file  Occupational History   Occupation: [employer]   Occupation: [employer]  Tobacco Use   Smoking status: Never   Smokeless tobacco: Never  Vaping Use   Vaping status: Never Used  Substance and Sexual Activity   Alcohol use: Yes    Comment: 1-2 per year   Drug use: No   Sexual activity: Yes    Birth control/protection: None  Other Topics Concern   Not on file  Social History Narrative   Married. Education: [employer].    Social Determinants of Health   Financial Resource Strain: Low Risk  (10/20/2022)   Overall Financial Resource Strain (CARDIA)    Difficulty of Paying Living Expenses: Not hard at all  Food Insecurity: No Food Insecurity (10/20/2022)   Hunger Vital Sign    Worried About Running Out of Food in the Last Year: Never true    Ran Out of Food in the Last Year: Never  true  Transportation Needs: No Transportation Needs (10/20/2022)   PRAPARE - Administrator, Civil Service (Medical): No    Lack of Transportation (Non-Medical): No  Physical Activity: Inactive (10/20/2022)   Exercise Vital Sign    Days of Exercise per Week: 0 days    Minutes of Exercise per Session: 0 min  Stress: No Stress Concern Present (10/20/2022)   [employer] of Occupational Health - Occupational Stress Questionnaire    Feeling of Stress : Not at all  Social Connections: Socially Integrated (10/20/2022)   Social Connection and Isolation Panel [NHANES]    Frequency of Communication with Friends and Family: More than three times a week    Frequency of Social Gatherings with Friends and Family: More than three times a week    Attends Religious Services: More than 4 times per year    Active Member of [employer] or Organizations: Yes    Attends Engineer, structural: More than 4 times per year    Marital Status: Married  Catering manager Violence: Not At Risk (10/20/2022)   Humiliation, Afraid, Rape, and Kick questionnaire    Fear of Current or Ex-Partner: No    Emotionally Abused: No    Physically Abused: No    Sexually Abused: No    Review of Systems:    Constitutional: No weight loss, fever or chills Cardiovascular: No chest pain Respiratory: No SOB  Gastrointestinal: See HPI and otherwise negative   Physical Exam:  Vital signs: BP (!) 140/84 (BP Location: Left Arm, Patient Position: Sitting, Cuff Size: Large)   Pulse 80   Ht 5\' 7"  (1.702 m)   Wt 235 lb 4 oz (106.7 kg)   BMI 36.85 kg/m    Constitutional:   Pleasant AA male appears to be in NAD, Well developed, Well Respiratory: Respirations even and unlabored. Lungs clear to auscultation bilaterally.   No wheezes, crackles, or rhonchi.  Cardiovascular: Normal S1, S2. No MRG. Regular rate and rhythm. No peripheral edema, cyanosis or pallor.  Gastrointestinal:  Soft, nondistended, nontender. No rebound  or guarding. Normal bowel sounds. No appreciable masses or hepatomegaly. Psychiatric: Oriented to person, place and time. Demonstrates good judgement and reason without abnormal affect or behaviors.  RELEVANT LABS AND IMAGING: CBC    Component Value Date/Time   WBC 5.3 02/23/2023 1753   RBC 4.73 02/23/2023 1753   HGB 13.8 02/23/2023 1753   HCT 44.4 02/23/2023 1753   PLT 190 02/23/2023 1753   MCV 93.9 02/23/2023 1753   MCV 96.2 07/24/2014 1114   MCH 29.2 02/23/2023 1753   MCHC 31.1 02/23/2023 1753   RDW 12.9  02/23/2023 1753   LYMPHSABS 1.2 02/23/2023 1753   MONOABS 0.5 02/23/2023 1753   EOSABS 0.4 02/23/2023 1753   BASOSABS 0.1 02/23/2023 1753    CMP     Component Value Date/Time   NA 137 02/23/2023 1753   NA 142 06/11/2017 1125   K 3.5 02/23/2023 1753   CL 101 02/23/2023 1753   CO2 25 02/23/2023 1753   GLUCOSE 99 02/23/2023 1753   BUN 19 02/23/2023 1753   BUN 21 06/11/2017 1125   CREATININE 1.96 (H) 02/23/2023 1753   CREATININE 1.53 (H) 05/13/2016 1753   CALCIUM 9.4 02/23/2023 1753   PROT 7.8 11/03/2022 0849   ALBUMIN 4.1 11/03/2022 0849   AST 22 11/03/2022 0849   ALT 18 11/03/2022 0849   ALKPHOS 145 (H) 11/03/2022 0849   BILITOT 0.4 11/03/2022 0849   GFRNONAA 36 (L) 02/23/2023 1753   GFRNONAA 55 (L) 12/18/2014 1247   GFRAA 51 (L) 06/11/2017 1125   GFRAA 63 12/18/2014 1247    Assessment: 1.  History of esophageal stricture: Dilated in June and doing well, maintained on Omeprazole 20 mg daily  Plan: 1.  Refilled Omeprazole 20 mg daily, 30 minutes before breakfast #90 with 3 refills. 2.  Patient will call clinic with any further symptoms.  Otherwise we will follow-up in 1 to 2 years.  Hyacinth Meeker, PA-C [facility] 05/07/2023, 9:32 AM  Cc: Georgina Quint, *

## 2023-05-07 NOTE — Patient Instructions (Signed)
We have sent the following medications to your pharmacy for you to pick up at your convenience: Omeprazole  Follow up as needed  _______________________________________________________  If your blood pressure at your visit was 140/90 or greater, please contact your primary care physician to follow up on this.  _______________________________________________________  If you are age 72 or older, your body mass index should be between 23-30. Your Body mass index is 36.85 kg/m. If this is out of the aforementioned range listed, please consider follow up with your Primary Care Provider.  If you are age 32 or younger, your body mass index should be between 19-25. Your Body mass index is 36.85 kg/m. If this is out of the aformentioned range listed, please consider follow up with your Primary Care Provider.   ________________________________________________________  The [facility] providers would like to encourage you to use [facility] to communicate with providers for non-urgent requests or questions.  Due to long hold times on the telephone, sending your provider a message by [facility] may be a faster and more efficient way to get a response.  Please allow 48 business hours for a response.  Please remember that this is for non-urgent requests.  _______________________________________________________   I appreciate the  opportunity to care for you  Thank You   Jacelyn Grip

## 2023-05-07 NOTE — Progress Notes (Signed)
Agree with assessment and plan as outlined.  

## 2023-06-23 ENCOUNTER — Ambulatory Visit (INDEPENDENT_AMBULATORY_CARE_PROVIDER_SITE_OTHER): Payer: Medicare Other | Admitting: Emergency Medicine

## 2023-06-23 ENCOUNTER — Encounter: Payer: Self-pay | Admitting: Emergency Medicine

## 2023-06-23 VITALS — BP 160/92 | HR 72 | Temp 98.1°F | Ht 67.0 in | Wt 244.2 lb

## 2023-06-23 DIAGNOSIS — N1832 Chronic kidney disease, stage 3b: Secondary | ICD-10-CM

## 2023-06-23 DIAGNOSIS — I10 Essential (primary) hypertension: Secondary | ICD-10-CM | POA: Diagnosis not present

## 2023-06-23 DIAGNOSIS — G40909 Epilepsy, unspecified, not intractable, without status epilepticus: Secondary | ICD-10-CM | POA: Diagnosis not present

## 2023-06-23 DIAGNOSIS — Z9189 Other specified personal risk factors, not elsewhere classified: Secondary | ICD-10-CM

## 2023-06-23 DIAGNOSIS — E785 Hyperlipidemia, unspecified: Secondary | ICD-10-CM | POA: Diagnosis not present

## 2023-06-23 MED ORDER — AMLODIPINE BESYLATE 5 MG PO TABS
5.0000 mg | ORAL_TABLET | Freq: Every day | ORAL | 3 refills | Status: AC
Start: 2023-06-23 — End: ?

## 2023-06-23 MED ORDER — WEGOVY 0.25 MG/0.5ML ~~LOC~~ SOAJ
0.2500 mg | SUBCUTANEOUS | 3 refills | Status: AC
Start: 2023-06-23 — End: ?

## 2023-06-23 NOTE — Assessment & Plan Note (Signed)
Diet and nutrition discussed Advised to decrease amount of daily carbohydrate intake and daily calories and increase amount of plant-based protein in his diet Will benefit from [facility].

## 2023-06-23 NOTE — Assessment & Plan Note (Signed)
Chronic stable condition Continue rosuvastatin 20 mg daily

## 2023-06-23 NOTE — Assessment & Plan Note (Signed)
Chronic stable condition. Advised to stay well-hydrated and avoid NSAIDs

## 2023-06-23 NOTE — Assessment & Plan Note (Signed)
Elevated blood pressure readings in the office and at home Continue valsartan HCT 320-12.5 mg daily Start amlodipine 5 mg daily Cardiovascular risks associated with uncontrolled hypertension discussed Dietary approaches to stop hypertension discussed

## 2023-06-23 NOTE — Patient Instructions (Signed)
Hypertension, Adult High blood pressure (hypertension) is when the force of blood pumping through the arteries is too strong. The arteries are the blood vessels that carry blood from the heart throughout the body. Hypertension forces the heart to work harder to pump blood and may cause arteries to become narrow or stiff. Untreated or uncontrolled hypertension can lead to a heart attack, heart failure, a stroke, kidney disease, and other problems. A blood pressure reading consists of a higher number over a lower number. Ideally, your blood pressure should be below 120/80. The first ("top") number is called the systolic pressure. It is a measure of the pressure in your arteries as your heart beats. The second ("bottom") number is called the diastolic pressure. It is a measure of the pressure in your arteries as the heart relaxes. What are the causes? The exact cause of this condition is not known. There are some conditions that result in high blood pressure. What increases the risk? Certain factors may make you more likely to develop high blood pressure. Some of these risk factors are under your control, including: Smoking. Not getting enough exercise or physical activity. Being overweight. Having too much fat, sugar, calories, or salt (sodium) in your diet. Drinking too much alcohol. Other risk factors include: Having a personal history of heart disease, diabetes, high cholesterol, or kidney disease. Stress. Having a family history of high blood pressure and high cholesterol. Having obstructive sleep apnea. Age. The risk increases with age. What are the signs or symptoms? High blood pressure may not cause symptoms. Very high blood pressure (hypertensive crisis) may cause: Headache. Fast or irregular heartbeats (palpitations). Shortness of breath. Nosebleed. Nausea and vomiting. Vision changes. Severe chest pain, dizziness, and seizures. How is this diagnosed? This condition is diagnosed by  measuring your blood pressure while you are seated, with your arm resting on a flat surface, your legs uncrossed, and your feet flat on the floor. The cuff of the blood pressure monitor will be placed directly against the skin of your upper arm at the level of your heart. Blood pressure should be measured at least twice using the same arm. Certain conditions can cause a difference in blood pressure between your right and left arms. If you have a high blood pressure reading during one visit or you have normal blood pressure with other risk factors, you may be asked to: Return on a different day to have your blood pressure checked again. Monitor your blood pressure at home for 1 week or longer. If you are diagnosed with hypertension, you may have other blood or imaging tests to help your health care provider understand your overall risk for other conditions. How is this treated? This condition is treated by making healthy lifestyle changes, such as eating healthy foods, exercising more, and reducing your alcohol intake. You may be referred for counseling on a healthy diet and physical activity. Your health care provider may prescribe medicine if lifestyle changes are not enough to get your blood pressure under control and if: Your systolic blood pressure is above 130. Your diastolic blood pressure is above 80. Your personal target blood pressure may vary depending on your medical conditions, your age, and other factors. Follow these instructions at home: Eating and drinking  Eat a diet that is high in fiber and potassium, and low in sodium, added sugar, and fat. An example of this eating plan is called the DASH diet. DASH stands for Dietary Approaches to Stop Hypertension. To eat this way: Eat  plenty of fresh fruits and vegetables. Try to fill one half of your plate at each meal with fruits and vegetables. Eat whole grains, such as whole-wheat pasta, brown rice, or whole-grain bread. Fill about one  fourth of your plate with whole grains. Eat or drink low-fat dairy products, such as skim milk or low-fat yogurt. Avoid fatty cuts of meat, processed or cured meats, and poultry with skin. Fill about one fourth of your plate with lean proteins, such as fish, chicken without skin, beans, eggs, or tofu. Avoid pre-made and processed foods. These tend to be higher in sodium, added sugar, and fat. Reduce your daily sodium intake. Many people with hypertension should eat less than 1,500 mg of sodium a day. Do not drink alcohol if: Your health care provider tells you not to drink. You are pregnant, may be pregnant, or are planning to become pregnant. If you drink alcohol: Limit how much you have to: 0-1 drink a day for women. 0-2 drinks a day for men. Know how much alcohol is in your drink. In the [place] one drink equals one 12 oz bottle of beer (355 mL), one 5 oz glass of wine (148 mL), or one 1 oz glass of hard liquor (44 mL). Lifestyle  Work with your health care provider to maintain a healthy body weight or to lose weight. Ask what an ideal weight is for you. Get at least 30 minutes of exercise that causes your heart to beat faster (aerobic exercise) most days of the week. Activities may include walking, swimming, or biking. Include exercise to strengthen your muscles (resistance exercise), such as Pilates or lifting weights, as part of your weekly exercise routine. Try to do these types of exercises for 30 minutes at least 3 days a week. Do not use any products that contain nicotine or tobacco. These products include cigarettes, chewing tobacco, and vaping devices, such as e-cigarettes. If you need help quitting, ask your health care provider. Monitor your blood pressure at home as told by your health care provider. Keep all follow-up visits. This is important. Medicines Take over-the-counter and prescription medicines only as told by your health care provider. Follow directions carefully. Blood  pressure medicines must be taken as prescribed. Do not skip doses of blood pressure medicine. Doing this puts you at risk for problems and can make the medicine less effective. Ask your health care provider about side effects or reactions to medicines that you should watch for. Contact a health care provider if you: Think you are having a reaction to a medicine you are taking. Have headaches that keep coming back (recurring). Feel dizzy. Have swelling in your ankles. Have trouble with your vision. Get help right away if you: Develop a severe headache or confusion. Have unusual weakness or numbness. Feel faint. Have severe pain in your chest or abdomen. Vomit repeatedly. Have trouble breathing. These symptoms may be an emergency. Get help right away. Call 911. Do not wait to see if the symptoms will go away. Do not drive yourself to the hospital. Summary Hypertension is when the force of blood pumping through your arteries is too strong. If this condition is not controlled, it may put you at risk for serious complications. Your personal target blood pressure may vary depending on your medical conditions, your age, and other factors. For most people, a normal blood pressure is less than 120/80. Hypertension is treated with lifestyle changes, medicines, or a combination of both. Lifestyle changes include losing weight, eating a healthy,  low-sodium diet, exercising more, and limiting alcohol. This information is not intended to replace advice given to you by your health care provider. Make sure you discuss any questions you have with your health care provider. Document Revised: 07/29/2021 Document Reviewed: 07/29/2021 Elsevier Patient Education  2024 [employer].

## 2023-06-23 NOTE — Progress Notes (Signed)
Wesley Mccarthy 72 y.o.   Chief Complaint  Patient presents with   Medical Management of Chronic Issues    F/u appt, no concerns     HISTORY OF PRESENT ILLNESS: This is a 72 y.o. male here for follow-up of chronic medical problems Since last visit was able to see GI doctor for upper endoscopy.  Esophageal dilatation relieved dysphagia symptoms History of hypertension.  Blood pressure readings elevated at home Requesting medication to help him lose weight  HPI   Prior to Admission medications   Medication Sig Start Date End Date Taking? Authorizing Provider  allopurinol (ZYLOPRIM) 100 MG tablet Take 1 tablet (100 mg total) by mouth daily. 11/03/22  Yes Nilaya Bouie, Eilleen Kempf, MD  cyclobenzaprine (FLEXERIL) 10 MG tablet Take 1 tablet (10 mg total) by mouth at bedtime. 02/08/23  Yes Leili Eskenazi, Eilleen Kempf, MD  omeprazole (PRILOSEC) 20 MG capsule Take 1 capsule (20 mg total) by mouth daily. 05/07/23  Yes Unk Lightning, PA  PHENobarbital (LUMINAL) 64.8 MG tablet TAKE ONE AND A HALF (1.5) TABLETS BY MOUTH EVERY NIGHT AT BEDTIME 04/05/23  Yes Lorraine Cimmino, Eilleen Kempf, MD  polyethylene glycol powder (GLYCOLAX/MIRALAX) powder Take 17 g by mouth 2 (two) times daily as needed. 11/22/17  Yes Doristine Bosworth, MD  Potassium Chloride ER 20 MEQ TBCR Take 1 tablet by mouth daily. 01/11/23  Yes [provider]  rosuvastatin (CRESTOR) 20 MG tablet TAKE ONE TABLET BY MOUTH DAILY 12/22/22  Yes Danisha Brassfield, Eilleen Kempf, MD  valsartan-hydrochlorothiazide (DIOVAN-HCT) 320-12.5 MG tablet Take 1 tablet by mouth daily. 02/24/23  Yes SagardiaEilleen Kempf, MD    Allergies  Allergen Reactions   Norvasc [Amlodipine Besylate]     Per patient caused his liver to shutdown.   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   Stasis dermatitis of both legs 12/21/2012   Edema 12/21/2012   Seizure disorder (HCC) 08/26/2012   Essential hypertension 11/11/2011   Dyslipidemia 11/11/2011   Chronic gout without tophus 11/11/2011   Obesity 11/11/2011    Past Medical History:  Diagnosis Date   Allergy    Arthritis    CKD (chronic kidney disease) stage 3, GFR 30-59 ml/min (HCC)    Epilepsy, unspecified, not intractable, without status epilepticus (HCC)    Esophageal obstruction    GERD (gastroesophageal reflux disease)    Hyperlipidemia    Hypertension    Osteoarthritis 2018   knee   Seizures (HCC)     Past Surgical History:  Procedure Laterality Date   APPENDECTOMY     TONSILLECTOMY     UPPER GASTROINTESTINAL ENDOSCOPY      Social History   Socioeconomic History   Marital status: Married    Spouse name: Not on file   Number of children: 3   Years of education: Not on file   Highest education level: Not on file  Occupational History   Occupation: [employer]   Occupation: [employer]  Tobacco Use   Smoking status: Never   Smokeless tobacco: Never  Vaping Use   Vaping status: Never Used  Substance and Sexual Activity   Alcohol use: Yes    Comment: 1-2 per year   Drug use: No  Sexual activity: Yes    Birth control/protection: None  Other Topics Concern   Not on file  Social History Narrative   Married. Education: [employer].    Social Determinants of Health   Financial Resource Strain: Low Risk  (10/20/2022)   Overall Financial Resource Strain (CARDIA)    Difficulty of Paying Living Expenses: Not hard at all  Food Insecurity: No Food Insecurity (10/20/2022)   Hunger Vital Sign    Worried About Running Out of Food in the Last Year: Never true    Ran Out of Food in the Last Year: Never true  Transportation Needs: No Transportation Needs (10/20/2022)   PRAPARE - Therapist, art (Medical): No    Lack of Transportation (Non-Medical): No  Physical Activity: Inactive (10/20/2022)   Exercise Vital Sign    Days of Exercise per Week: 0 days    Minutes of Exercise per Session: 0 min  Stress: No Stress Concern Present (10/20/2022)   [employer] of Occupational Health - Occupational Stress Questionnaire    Feeling of Stress : Not at all  Social Connections: Socially Integrated (10/20/2022)   Social Connection and Isolation Panel [NHANES]    Frequency of Communication with Friends and Family: More than three times a week    Frequency of Social Gatherings with Friends and Family: More than three times a week    Attends Religious Services: More than 4 times per year    Active Member of [employer] or Organizations: Yes    Attends Engineer, structural: More than 4 times per year    Marital Status: Married  Catering manager Violence: Not At Risk (10/20/2022)   Humiliation, Afraid, Rape, and Kick questionnaire    Fear of Current or Ex-Partner: No    Emotionally Abused: No    Physically Abused: No    Sexually Abused: No    Family History  Problem Relation Age of Onset   Heart disease Mother        Had a CABG   Colon cancer Father    Seizures Daughter    Diabetes Paternal Aunt    Stomach cancer Neg Hx    Esophageal cancer Neg Hx    Rectal cancer Neg Hx    Liver cancer Neg Hx      Review of Systems  Constitutional: Negative.  Negative for chills and fever.  HENT: Negative.  Negative for congestion.   Respiratory: Negative.  Negative for cough and shortness of breath.   Cardiovascular: Negative.  Negative for chest pain and palpitations.  Gastrointestinal:  Negative for abdominal pain, diarrhea, nausea and vomiting.  Genitourinary: Negative.  Negative for dysuria and hematuria.  Skin: Negative.  Negative for rash.  Neurological: Negative.  Negative for dizziness and headaches.  All other systems reviewed and are  negative.   Vitals:   06/23/23 1448  BP: (!) 160/92  Pulse: 72  Temp: 98.1 F (36.7 C)  SpO2: 96%    Physical Exam Vitals reviewed.  Constitutional:      Appearance: Normal appearance.  HENT:     Head: Normocephalic.  Eyes:     Extraocular Movements: Extraocular movements intact.     Pupils: Pupils are equal, round, and reactive to light.  Cardiovascular:     Rate and Rhythm: Normal rate and regular rhythm.     Pulses: Normal pulses.     Heart sounds: Normal heart sounds.  Pulmonary:     Effort: Pulmonary effort is normal.  Breath sounds: Normal breath sounds.  Musculoskeletal:     Cervical back: No tenderness.  Lymphadenopathy:     Cervical: No cervical adenopathy.  Skin:    General: Skin is warm and dry.     Capillary Refill: Capillary refill takes less than 2 seconds.  Neurological:     General: No focal deficit present.     Mental Status: He is alert and oriented to person, place, and time.  Psychiatric:        Mood and Affect: Mood normal.        Behavior: Behavior normal.      ASSESSMENT & PLAN: A total of 46 minutes was spent with the patient and counseling/coordination of care regarding preparing for this visit, review of most recent office visit notes, review of multiple chronic medical conditions under management, cardiovascular risks associated with uncontrolled hypertension, review of all medications and changes made, education on nutrition, review of health maintenance items, prognosis, documentation and need for follow-up.  Problem List Items Addressed This Visit       Cardiovascular and Mediastinum   Essential hypertension - Primary    Elevated blood pressure readings in the office and at home Continue valsartan HCT 320-12.5 mg daily Start amlodipine 5 mg daily Cardiovascular risks associated with uncontrolled hypertension discussed Dietary approaches to stop hypertension discussed      Relevant Medications   amLODipine (NORVASC) 5 MG  tablet     Nervous and Auditory   Seizure disorder (HCC)     Genitourinary   CKD (chronic kidney disease) stage 3, GFR 30-59 ml/min (HCC)    Chronic stable condition Advised to stay well-hydrated and avoid NSAIDs        Other   Dyslipidemia    Chronic stable condition Continue rosuvastatin 20 mg daily      Morbid obesity (HCC)    Diet and nutrition discussed Advised to decrease amount of daily carbohydrate intake and daily calories and increase amount of plant-based protein in his diet Will benefit from [facility].      Relevant Medications   Semaglutide-Weight Management (WEGOVY) 0.25 MG/0.5ML SOAJ   At risk for cardiovascular event   Relevant Medications   Semaglutide-Weight Management (WEGOVY) 0.25 MG/0.5ML SOAJ   Patient Instructions  Hypertension, Adult High blood pressure (hypertension) is when the force of blood pumping through the arteries is too strong. The arteries are the blood vessels that carry blood from the heart throughout the body. Hypertension forces the heart to work harder to pump blood and may cause arteries to become narrow or stiff. Untreated or uncontrolled hypertension can lead to a heart attack, heart failure, a stroke, kidney disease, and other problems. A blood pressure reading consists of a higher number over a lower number. Ideally, your blood pressure should be below 120/80. The first ("top") number is called the systolic pressure. It is a measure of the pressure in your arteries as your heart beats. The second ("bottom") number is called the diastolic pressure. It is a measure of the pressure in your arteries as the heart relaxes. What are the causes? The exact cause of this condition is not known. There are some conditions that result in high blood pressure. What increases the risk? Certain factors may make you more likely to develop high blood pressure. Some of these risk factors are under your control, including: Smoking. Not getting enough  exercise or physical activity. Being overweight. Having too much fat, sugar, calories, or salt (sodium) in your diet. Drinking too much alcohol.  Other risk factors include: Having a personal history of heart disease, diabetes, high cholesterol, or kidney disease. Stress. Having a family history of high blood pressure and high cholesterol. Having obstructive sleep apnea. Age. The risk increases with age. What are the signs or symptoms? High blood pressure may not cause symptoms. Very high blood pressure (hypertensive crisis) may cause: Headache. Fast or irregular heartbeats (palpitations). Shortness of breath. Nosebleed. Nausea and vomiting. Vision changes. Severe chest pain, dizziness, and seizures. How is this diagnosed? This condition is diagnosed by measuring your blood pressure while you are seated, with your arm resting on a flat surface, your legs uncrossed, and your feet flat on the floor. The cuff of the blood pressure monitor will be placed directly against the skin of your upper arm at the level of your heart. Blood pressure should be measured at least twice using the same arm. Certain conditions can cause a difference in blood pressure between your right and left arms. If you have a high blood pressure reading during one visit or you have normal blood pressure with other risk factors, you may be asked to: Return on a different day to have your blood pressure checked again. Monitor your blood pressure at home for 1 week or longer. If you are diagnosed with hypertension, you may have other blood or imaging tests to help your health care provider understand your overall risk for other conditions. How is this treated? This condition is treated by making healthy lifestyle changes, such as eating healthy foods, exercising more, and reducing your alcohol intake. You may be referred for counseling on a healthy diet and physical activity. Your health care provider may prescribe medicine  if lifestyle changes are not enough to get your blood pressure under control and if: Your systolic blood pressure is above 130. Your diastolic blood pressure is above 80. Your personal target blood pressure may vary depending on your medical conditions, your age, and other factors. Follow these instructions at home: Eating and drinking  Eat a diet that is high in fiber and potassium, and low in sodium, added sugar, and fat. An example of this eating plan is called the DASH diet. DASH stands for Dietary Approaches to Stop Hypertension. To eat this way: Eat plenty of fresh fruits and vegetables. Try to fill one half of your plate at each meal with fruits and vegetables. Eat whole grains, such as whole-wheat pasta, brown rice, or whole-grain bread. Fill about one fourth of your plate with whole grains. Eat or drink low-fat dairy products, such as skim milk or low-fat yogurt. Avoid fatty cuts of meat, processed or cured meats, and poultry with skin. Fill about one fourth of your plate with lean proteins, such as fish, chicken without skin, beans, eggs, or tofu. Avoid pre-made and processed foods. These tend to be higher in sodium, added sugar, and fat. Reduce your daily sodium intake. Many people with hypertension should eat less than 1,500 mg of sodium a day. Do not drink alcohol if: Your health care provider tells you not to drink. You are pregnant, may be pregnant, or are planning to become pregnant. If you drink alcohol: Limit how much you have to: 0-1 drink a day for women. 0-2 drinks a day for men. Know how much alcohol is in your drink. In the [place] one drink equals one 12 oz bottle of beer (355 mL), one 5 oz glass of wine (148 mL), or one 1 oz glass of hard liquor (44 mL). Lifestyle  Work with your health care provider to maintain a healthy body weight or to lose weight. Ask what an ideal weight is for you. Get at least 30 minutes of exercise that causes your heart to beat faster  (aerobic exercise) most days of the week. Activities may include walking, swimming, or biking. Include exercise to strengthen your muscles (resistance exercise), such as Pilates or lifting weights, as part of your weekly exercise routine. Try to do these types of exercises for 30 minutes at least 3 days a week. Do not use any products that contain nicotine or tobacco. These products include cigarettes, chewing tobacco, and vaping devices, such as e-cigarettes. If you need help quitting, ask your health care provider. Monitor your blood pressure at home as told by your health care provider. Keep all follow-up visits. This is important. Medicines Take over-the-counter and prescription medicines only as told by your health care provider. Follow directions carefully. Blood pressure medicines must be taken as prescribed. Do not skip doses of blood pressure medicine. Doing this puts you at risk for problems and can make the medicine less effective. Ask your health care provider about side effects or reactions to medicines that you should watch for. Contact a health care provider if you: Think you are having a reaction to a medicine you are taking. Have headaches that keep coming back (recurring). Feel dizzy. Have swelling in your ankles. Have trouble with your vision. Get help right away if you: Develop a severe headache or confusion. Have unusual weakness or numbness. Feel faint. Have severe pain in your chest or abdomen. Vomit repeatedly. Have trouble breathing. These symptoms may be an emergency. Get help right away. Call 911. Do not wait to see if the symptoms will go away. Do not drive yourself to the hospital. Summary Hypertension is when the force of blood pumping through your arteries is too strong. If this condition is not controlled, it may put you at risk for serious complications. Your personal target blood pressure may vary depending on your medical conditions, your age, and other  factors. For most people, a normal blood pressure is less than 120/80. Hypertension is treated with lifestyle changes, medicines, or a combination of both. Lifestyle changes include losing weight, eating a healthy, low-sodium diet, exercising more, and limiting alcohol. This information is not intended to replace advice given to you by your health care provider. Make sure you discuss any questions you have with your health care provider. Document Revised: 07/29/2021 Document Reviewed: 07/29/2021 Elsevier Patient Education  2024 Elsevier Inc.      Edwina Barth, MD [facility]

## 2023-06-25 ENCOUNTER — Telehealth: Payer: Self-pay

## 2023-06-25 ENCOUNTER — Other Ambulatory Visit (HOSPITAL_COMMUNITY): Payer: Self-pay

## 2023-06-25 NOTE — Telephone Encounter (Signed)
Pharmacy Patient Advocate Encounter   Received notification from Physician's Office that prior authorization for [facility] is required/requested.   Insurance verification completed.   The patient is insured through [facility] .   Per test claim: PA required; PA submitted to [facility] via CoverMyMeds Key/confirmation #/EOC Key: [id number]   Status is pending

## 2023-06-28 NOTE — Telephone Encounter (Signed)
Pharmacy Patient Advocate Encounter  Received notification from [facility] that Prior Authorization for [facility] has been DENIED.  Full denial letter will be uploaded to the media tab. See denial reason below.   PA #/Case ID/Reference #: Key: [id number]

## 2023-06-30 ENCOUNTER — Other Ambulatory Visit: Payer: Self-pay | Admitting: Emergency Medicine

## 2023-06-30 DIAGNOSIS — G40909 Epilepsy, unspecified, not intractable, without status epilepticus: Secondary | ICD-10-CM

## 2023-06-30 NOTE — Telephone Encounter (Signed)
Patient called and wants to know why the phenobarbitol was refused.  Phone#  [phone]

## 2023-07-02 ENCOUNTER — Other Ambulatory Visit: Payer: Self-pay | Admitting: *Deleted

## 2023-07-02 DIAGNOSIS — G40909 Epilepsy, unspecified, not intractable, without status epilepticus: Secondary | ICD-10-CM

## 2023-07-02 NOTE — Telephone Encounter (Signed)
Informed patient about the Reginal Lutes being denied.

## 2023-07-08 ENCOUNTER — Ambulatory Visit: Payer: Medicare Other | Admitting: Neurology

## 2023-07-08 ENCOUNTER — Encounter: Payer: Self-pay | Admitting: Neurology

## 2023-07-08 ENCOUNTER — Ambulatory Visit: Payer: Medicare Other | Admitting: Nurse Practitioner

## 2023-07-08 VITALS — BP 154/86 | HR 83 | Temp 98.2°F | Ht 70.0 in | Wt 240.1 lb

## 2023-07-08 VITALS — BP 146/87 | HR 84 | Ht 70.0 in | Wt 241.4 lb

## 2023-07-08 DIAGNOSIS — M25421 Effusion, right elbow: Secondary | ICD-10-CM | POA: Insufficient documentation

## 2023-07-08 DIAGNOSIS — G40909 Epilepsy, unspecified, not intractable, without status epilepticus: Secondary | ICD-10-CM

## 2023-07-08 DIAGNOSIS — Z23 Encounter for immunization: Secondary | ICD-10-CM | POA: Diagnosis not present

## 2023-07-08 DIAGNOSIS — Z79899 Other long term (current) drug therapy: Secondary | ICD-10-CM

## 2023-07-08 MED ORDER — PHENOBARBITAL 64.8 MG PO TABS
ORAL_TABLET | ORAL | 3 refills | Status: DC
Start: 2023-07-08 — End: 2024-03-13

## 2023-07-08 NOTE — Progress Notes (Signed)
Established Patient Office Visit  Subjective   Patient ID: Wesley Mccarthy, male    DOB: 1951/08/29  Age: 72 y.o. MRN: [id number]  Chief Complaint  Patient presents with   Joint Swelling    Mr. Wesley Mccarthy is a 71 y/o male with PMH significnat for arthritis, CKD stage 3, epilepsy, GERD, hyperlipidemia, HTN, OA.   He arrives with 1 day history of right elbow swelling. Nontender. Concerned about possible spider bite. He also reports that he has had nasal congestion and dry cough for 7 days. No shortness of breath, no wheezing, on sputum production.     Review of Systems  Constitutional:  Positive for chills. Negative for fever.  HENT:  Positive for congestion. Negative for sinus pain.   Respiratory:  Positive for cough. Negative for sputum production, shortness of breath and wheezing.   Cardiovascular:  Negative for chest pain.      Objective:     BP (!) 154/86   Pulse 83   Temp 98.2 F (36.8 C) (Temporal)   Ht 5\' 10"  (1.778 m)   Wt 240 lb 2 oz (108.9 kg)   SpO2 97%   BMI 34.45 kg/m  BP Readings from Last 3 Encounters:  07/08/23 (!) 154/86  07/08/23 (!) 146/87  06/23/23 (!) 160/92   Wt Readings from Last 3 Encounters:  07/08/23 240 lb 2 oz (108.9 kg)  07/08/23 241 lb 6.4 oz (109.5 kg)  06/23/23 244 lb 4 oz (110.8 kg)      Physical Exam Vitals reviewed.  Constitutional:      Appearance: Normal appearance.  HENT:     Head: Normocephalic and atraumatic.  Cardiovascular:     Rate and Rhythm: Normal rate and regular rhythm.  Pulmonary:     Effort: Pulmonary effort is normal.     Breath sounds: Normal breath sounds.  Musculoskeletal:       Arms:     Cervical back: Neck supple.  Skin:    General: Skin is warm and dry.  Neurological:     Mental Status: He is alert and oriented to person, place, and time.  Psychiatric:        Mood and Affect: Mood normal.        Behavior: Behavior normal.        Thought Content: Thought content normal.        Judgment: Judgment  normal.      No results found for any visits on 07/08/23.    The 10-year ASCVD risk score (Arnett DK, et al., 2019) is: 34.4%    Assessment & Plan:   Problem List Items Addressed This Visit       Musculoskeletal and Integument   Elbow effusion, right - Primary    Acute No evidence of acute infection. No obvious break in the skin, but patient is not sure exactly when symptoms started he is concerned about spider bite. Last tetanus administered in 2017. Will update Td today and refer to sports medicine for further evaluation for possible effusion. No evidence for acute infection today, hold off on antibiotics but patient educated on s/s of infection and to call office back if these signs or symptoms occur.       Relevant Orders   Ambulatory referral to Sports Medicine   Td vaccine greater than or equal to 7yo preservative free IM (Completed)     Other   Need for vaccination    Td administered, VIS provided      Relevant Orders   Td  vaccine greater than or equal to 7yo preservative free IM (Completed)    Return if symptoms worsen or fail to improve.    Elenore Paddy, NP

## 2023-07-08 NOTE — Assessment & Plan Note (Signed)
Acute No evidence of acute infection. No obvious break in the skin, but patient is not sure exactly when symptoms started he is concerned about spider bite. Last tetanus administered in 2017. Will update Td today and refer to sports medicine for further evaluation for possible effusion. No evidence for acute infection today, hold off on antibiotics but patient educated on s/s of infection and to call office back if these signs or symptoms occur.

## 2023-07-08 NOTE — Assessment & Plan Note (Signed)
Td administered, VIS provided

## 2023-07-08 NOTE — Progress Notes (Signed)
NEUROLOGY CONSULTATION NOTE  Wesley Mccarthy MRN: [id number] DOB: 04-29-1951  Referring provider: Dr. Edwina Barth Primary care provider:  Dr. Edwina Barth  Reason for consult:  seizures  Dear Dr Alvy Bimler:  Thank you for your kind referral of Wesley Mccarthy for consultation of the above symptoms. Although his history is well known to you, please allow me to reiterate it for the purpose of our medical record. He is alone in the office today. Records and images were personally reviewed where available.   HISTORY OF PRESENT ILLNESS: This is a 72 year old right-handed man with a history of hypertension, hyperlipidemia, CKD, esophageal obstruction, osteoarthritis, epilepsy, presenting to establish care for seizures. Records were reviewed, he had seen neurologist Dr. Pearlean Brownie in 2017. Seizures started in the third grade. He thinks his last seizure was in his 72s, and states that they start with feeling nervous, he does not fall down or lose control of his body, he is able to talk and understand. He denies any history of convulsions or staring spells. Notes indicate that in the past he described seizures as losing body control for a few seconds, triggered by extreme excitement. He was initially on Dilantin and Phenobarbital, then Dilantin was discontinued. He has been on Phenobarbital for most of his life. He states he is taking 1 tablet daily without side effects. There is report that when he saw Dr. Anne Hahn in 2013 it was reported that he had some breakthrough seizures once he tried to taper himself off Phenobarbital. He had an EEG in 2017 that was normal. There is a head CT from 2008 with no acute changes.   He denies any staring/unresponsive episodes, gaps in time, olfactory/gustatory hallucinations, focal numbness/tingling/weakness, myoclonic jerks. No headaches, dizziness, diplopia, bowel/bladder dysfunction. He has some difficulty swallowing. He has arthritis pains in his neck and back. Yesterday he  was at work (works part time at [employer]) and noticed a soft painless mass on his right elbow. No falls. He states he sleeps at night and in the daytime. He lives with his wife who has reported snoring. He states daytime drowsiness is "retirement sleep" and does not feel the need for a sleep study.   Epilepsy Risk Factors:  His daughter had one seizure when she was a few months old. He had a normal birth and early development.  There is no history of febrile convulsions, CNS infections such as meningitis/encephalitis, significant traumatic brain injury, neurosurgical procedures.  Prior ASMs: Dilantin Laboratory Data:  Lab Results  Component Value Date   WBC 5.3 02/23/2023   HGB 13.8 02/23/2023   HCT 44.4 02/23/2023   MCV 93.9 02/23/2023   PLT 190 02/23/2023     Chemistry      Component Value Date/Time   NA 137 02/23/2023 1753   NA 142 06/11/2017 1125   K 3.5 02/23/2023 1753   CL 101 02/23/2023 1753   CO2 25 02/23/2023 1753   BUN 19 02/23/2023 1753   BUN 21 06/11/2017 1125   CREATININE 1.96 (H) 02/23/2023 1753   CREATININE 1.53 (H) 05/13/2016 1753      Component Value Date/Time   CALCIUM 9.4 02/23/2023 1753   ALKPHOS 145 (H) 11/03/2022 0849   AST 22 11/03/2022 0849   ALT 18 11/03/2022 0849   BILITOT 0.4 11/03/2022 0849      PAST MEDICAL HISTORY: Past Medical History:  Diagnosis Date   Allergy    Arthritis    CKD (chronic kidney disease) stage 3, GFR 30-59 ml/min (HCC)  Epilepsy, unspecified, not intractable, without status epilepticus (HCC)    Esophageal obstruction    GERD (gastroesophageal reflux disease)    Hyperlipidemia    Hypertension    Osteoarthritis 2018   knee   Seizures (HCC)     PAST SURGICAL HISTORY: Past Surgical History:  Procedure Laterality Date   APPENDECTOMY     TONSILLECTOMY     UPPER GASTROINTESTINAL ENDOSCOPY      MEDICATIONS: Current Outpatient Medications on File Prior to Visit  Medication Sig Dispense Refill   allopurinol  (ZYLOPRIM) 100 MG tablet Take 1 tablet (100 mg total) by mouth daily. 90 tablet 1   omeprazole (PRILOSEC) 20 MG capsule Take 1 capsule (20 mg total) by mouth daily. 90 capsule 3   PHENobarbital (LUMINAL) 64.8 MG tablet TAKE ONE AND A HALF (1.5) TABLETS BY MOUTH EVERY NIGHT AT BEDTIME 90 tablet 0   polyethylene glycol powder (GLYCOLAX/MIRALAX) powder Take 17 g by mouth 2 (two) times daily as needed. 3350 g 1   Potassium Chloride ER 20 MEQ TBCR Take 1 tablet by mouth daily.     rosuvastatin (CRESTOR) 20 MG tablet TAKE ONE TABLET BY MOUTH DAILY 90 tablet 3   valsartan-hydrochlorothiazide (DIOVAN-HCT) 320-12.5 MG tablet Take 1 tablet by mouth daily. 90 tablet 3   No current facility-administered medications on file prior to visit.    ALLERGIES: Allergies  Allergen Reactions   Norvasc [Amlodipine Besylate]     Per patient caused his liver to shutdown.   Penicillins     FAMILY HISTORY: Family History  Problem Relation Age of Onset   Heart disease Mother        Had a CABG   Colon cancer Father    Seizures Daughter    Diabetes Paternal Aunt    Stomach cancer Neg Hx    Esophageal cancer Neg Hx    Rectal cancer Neg Hx    Liver cancer Neg Hx     SOCIAL HISTORY: Social History   Socioeconomic History   Marital status: Married    Spouse name: Not on file   Number of children: 3   Years of education: Not on file   Highest education level: Not on file  Occupational History   Occupation: [employer]   Occupation: [employer]  Tobacco Use   Smoking status: Never   Smokeless tobacco: Never  Vaping Use   Vaping status: Never Used  Substance and Sexual Activity   Alcohol use: Yes    Comment: 1-2 per year   Drug use: No   Sexual activity: Yes    Birth control/protection: None  Other Topics Concern   Not on file  Social History Narrative   Married. Education: [employer].    Right handed    Social Determinants of Health   Financial Resource Strain: Low Risk   (10/20/2022)   Overall Financial Resource Strain (CARDIA)    Difficulty of Paying Living Expenses: Not hard at all  Food Insecurity: No Food Insecurity (10/20/2022)   Hunger Vital Sign    Worried About Running Out of Food in the Last Year: Never true    Ran Out of Food in the Last Year: Never true  Transportation Needs: No Transportation Needs (10/20/2022)   PRAPARE - Administrator, Civil Service (Medical): No    Lack of Transportation (Non-Medical): No  Physical Activity: Inactive (10/20/2022)   Exercise Vital Sign    Days of Exercise per Week: 0 days    Minutes of Exercise per Session:  0 min  Stress: No Stress Concern Present (10/20/2022)   [employer] of Occupational Health - Occupational Stress Questionnaire    Feeling of Stress : Not at all  Social Connections: Socially Integrated (10/20/2022)   Social Connection and Isolation Panel [NHANES]    Frequency of Communication with Friends and Family: More than three times a week    Frequency of Social Gatherings with Friends and Family: More than three times a week    Attends Religious Services: More than 4 times per year    Active Member of [employer] or Organizations: Yes    Attends Banker Meetings: More than 4 times per year    Marital Status: Married  Catering manager Violence: Not At Risk (10/20/2022)   Humiliation, Afraid, Rape, and Kick questionnaire    Fear of Current or Ex-Partner: No    Emotionally Abused: No    Physically Abused: No    Sexually Abused: No     PHYSICAL EXAM: Vitals:   07/08/23 0844 07/08/23 0847  BP: (!) 148/79 (!) 146/87  Pulse: 84   SpO2: 96%    General: No acute distress Head:  Normocephalic/atraumatic Skin/Extremities: No rash, no edema. +soft painless swelling at right elbow Neurological Exam: Mental status: alert and oriented to person, place, and time, no dysarthria or aphasia, Fund of knowledge is appropriate.  Recent and remote memory are intact, 3/3 delayed recall.   Attention and concentration are normal, 5/5 WORLD backwards.  Cranial nerves: CN I: not tested CN II: pupils equal, round, visual fields intact CN III, IV, VI:  full range of motion, no nystagmus, no ptosis CN V: facial sensation intact CN VII: upper and lower face symmetric CN VIII: hearing intact to conversation Bulk & Tone: normal, no fasciculations. Motor: 5/5 throughout with no pronator drift. Sensation: intact to light touch, cold, vibration sense.  No extinction to double simultaneous stimulation.  Romberg test negative Deep Tendon Reflexes: +2 both UE, +1 both LE Cerebellar: no incoordination on finger to nose testing Gait: slightly hunched posture, gait narrow-based and steady, no ataxia Tremor: none   IMPRESSION: This is a 72 year old right-handed man with a history of hypertension, hyperlipidemia, CKD, esophageal obstruction, osteoarthritis, epilepsy, presenting to establish care for seizures. He reports a history of seizures since childhood, none since his 69s. Etiology unclear, semiology described by patient is nonspecific, he denies any staring episodes or convulsions. EEG in 2017 normal. He has been on Phenobarbital since childhood, we discussed risks of long-term phenobarbital use, particularly effects on bone health. Bone density scan recommended every 3 years. We discussed the possibility of weaning off medication, he will need updated imaging and EEG first, and if normal, we can wean off medication, understanding risk for breakthrough seizure with any medication adjustment. He would like to stay on medication, refills sent for Phenobarbital 64.8mg  daily. [place] driving laws were discussed with the patient, and he knows to stop driving after a seizure, until 6 months seizure-free. He will discuss swelling on right elbow with Dr. Alvy Bimler, possibly olecranon bursitis. Follow-up in 1 year, call for any changes.    Thank you for allowing me to participate in the care of this  patient. Please do not hesitate to call for any questions or concerns.   Patrcia Dolly, M.D.  CC: Dr. Alvy Bimler

## 2023-07-08 NOTE — Patient Instructions (Addendum)
Good to meet you.  Continue Phenobarbital 64.8mg : take 1 tablet daily  2. Schedule bone density scan  3. You can try applying a cold pack on your elbow. If swelling does not improve, please contact Dr. Alvy Bimler  4. Follow-up in 1 year, call for any changes   Seizure Precautions: 1. If medication has been prescribed for you to prevent seizures, take it exactly as directed.  Do not stop taking the medicine without talking to your doctor first, even if you have not had a seizure in a long time.   2. Avoid activities in which a seizure would cause danger to yourself or to others.  Don't operate dangerous machinery, swim alone, or climb in high or dangerous places, such as on ladders, roofs, or girders.  Do not drive unless your doctor says you may.  3. If you have any warning that you may have a seizure, lay down in a safe place where you can't hurt yourself.    4.  No driving for 6 months from last seizure, as per [facility].   Please refer to the following link on the Epilepsy Foundation of America's website for more information: http://www.epilepsyfoundation.org/answerplace/Social/driving/drivingu.cfm   5.  Maintain good sleep hygiene. Avoid alcohol.  6.  Contact your doctor if you have any problems that may be related to the medicine you are taking.  7.  Call 911 and bring the patient back to the ED if:        A.  The seizure lasts longer than 5 minutes.       B.  The patient doesn't awaken shortly after the seizure  C.  The patient has new problems such as difficulty seeing, speaking or moving  D.  The patient was injured during the seizure  E.  The patient has a temperature over 102 F (39C)  F.  The patient vomited and now is having trouble breathing

## 2023-07-12 ENCOUNTER — Other Ambulatory Visit: Payer: Self-pay

## 2023-07-12 ENCOUNTER — Ambulatory Visit: Payer: Medicare Other | Admitting: Family Medicine

## 2023-07-12 VITALS — BP 156/88 | HR 71 | Ht 70.0 in | Wt 244.0 lb

## 2023-07-12 DIAGNOSIS — M7021 Olecranon bursitis, right elbow: Secondary | ICD-10-CM | POA: Insufficient documentation

## 2023-07-12 NOTE — Patient Instructions (Addendum)
Thank you for coming in today.   I recommend you obtained a compression sleeve to help with your joint problems. There are many options on the market however I recommend obtaining a Full Elbow Body Helix compression sleeve.  You can find information (including how to appropriate measure yourself for sizing) can be found at www.Body GrandRapidsWifi.ch.  Many of these products are health savings account (HSA) eligible.  You can use the compression sleeve at any time throughout the day but is most important to use while being active as well as for 2 hours post-activity.   It is appropriate to ice following activity with the compression sleeve in place.   Check back in 1 month  Avoid pressure on the elbow.   If not better or if worse I can do a shot any time.   Please go to [facility] supply to get the elbow compression sleeve we talked about today. You may also be able to get it from [employer].

## 2023-07-12 NOTE — Progress Notes (Signed)
   Rubin Payor, PhD, LAT, ATC acting as a scribe for [name], MD.  Wesley Mccarthy is a 72 y.o. male who presents to [facility] at [facility] today for R elbow pain. Pt was previously seen by Dr. Jean Rosenthal in 2023 for LBP.  Today, pt c/o R elbow pain ongoing since Oct 2nd. No injury that he can recall. He notes not much pain, but swelling present over the posterior aspect of his R elbow.  R elbow swelling: yes Paresthesia: no Aggravates: Treatments tried: ice, alcohol  Pertinent review of systems: No fevers or chills  Relevant historical information: Hypertension and CKD.  History of seizure disorder.   Exam:  BP (!) 156/88   Pulse 71   Ht 5\' 10"  (1.778 m)   Wt 244 lb (110.7 kg)   SpO2 98%   BMI 35.01 kg/m  General: Well Developed, well nourished, and in no acute distress.   MSK: Right elbow swelling at posterior elbow overlying the olecranon consistent with olecranon bursitis.  Normal elbow motion.  Strength is intact.  Nontender.      Assessment and Plan: 72 y.o. male with right elbow olecranon bursitis.  This occurred spontaneously without injury that he is aware of.  I suspect he probably does a lot of sitting in a chair where he is putting pressure on that elbow.  We talked about treatment plan and options and him collectively decided on the conservative management approach.  Will give it a month.  Will try compression sleeve and reduce pressure on the elbow.  In a month if not better will consider aspiration and injection.  Recheck sooner if needed.  Recheck in 1 month.   PDMP not reviewed this encounter. No orders of the defined types were placed in this encounter.  No orders of the defined types were placed in this encounter.    Discussed warning signs or symptoms. Please see discharge instructions. Patient expresses understanding.   The above documentation has been reviewed and is accurate and complete [name], M.D.

## 2023-07-14 ENCOUNTER — Ambulatory Visit: Payer: Medicare Other | Admitting: Family Medicine

## 2023-07-14 ENCOUNTER — Other Ambulatory Visit: Payer: Self-pay

## 2023-07-14 VITALS — BP 164/88 | HR 82 | Ht 70.0 in | Wt 246.0 lb

## 2023-07-14 DIAGNOSIS — M7021 Olecranon bursitis, right elbow: Secondary | ICD-10-CM | POA: Diagnosis not present

## 2023-07-14 NOTE — Progress Notes (Signed)
   Rubin Payor, PhD, LAT, ATC acting as a scribe for Wesley Graham, MD.  Wesley Mccarthy is a 72 y.o. male who presents to [facility] at [facility] today for cont'd R elbow olecranon bursitis. Pt was seen by Dr. Denyse Amass 2 days ago and was advised to give it a month using compression and reduced pressure on the elbow.   Today, pt reports he has been wearing the compression sleeve, but is not currently at the time of his visit. No pain noted. He feels like he should have had it aspirated at his last visit. He states, "he just wants it out."  Pertinent review of systems: No fevers or chills  Relevant historical information: Seizure disorder and CKD.   Exam:  BP (!) 164/88   Pulse 82   Ht 5\' 10"  (1.778 m)   Wt 246 lb (111.6 kg)   SpO2 95%   BMI 35.30 kg/m  General: Well Developed, well nourished, and in no acute distress.   MSK: Right elbow swelling overlying olecranon prominence.  No skin erythema.    Lab and Radiology Results  Procedure: Real-time Ultrasound Guided aspiration and injection of right olecranon bursitis   Device: Philips Affiniti 50G/GE Logiq Images permanently stored and available for review in PACS Verbal informed consent obtained.  Discussed risks and benefits of procedure. Warned about infection, bleeding, hyperglycemia damage to structures among others. Patient expresses understanding and agreement Time-out conducted.   Noted no overlying erythema, induration, or other signs of local infection.   Skin prepped in a sterile fashion.   Local anesthesia: Topical Ethyl chloride.   With sterile technique and under real time ultrasound guidance: 2 mL of lidocaine injected into subcutaneous tissue overlying olecranon bursa achieving good anesthesia. Then again sterilized with isopropyl alcohol and an 18-gauge needle was used to access the bursa. 7 mL of clear straw-colored fluid was aspirated. Syringe was exchanged and 40 mg of Kenalog and 1 mL of  lidocaine was injected into the now decompressed bursa. Completed without difficulty   Pain immediately resolved suggesting accurate placement of the medication.   Compression Ace wrap was applied overlying the bursa following the aspiration and injection Advised to call if fevers/chills, erythema, induration, drainage, or persistent bleeding.   Images permanently stored and available for review in the ultrasound unit.  Impression: Technically successful ultrasound guided injection.         Assessment and Plan: 72 y.o. male with right olecranon bursitis status post aspiration and injection.  Continue compression.  Check back in 1 month.   PDMP not reviewed this encounter. Orders Placed This Encounter  Procedures   [place] LIMITED JOINT SPACE STRUCTURES UP RIGHT(NO LINKED CHARGES)    Order Specific Question:   Reason for Exam (SYMPTOM  OR DIAGNOSIS REQUIRED)    Answer:   right elbow pain    Order Specific Question:   Preferred imaging location?    Answer:   [facility]   No orders of the defined types were placed in this encounter.    Discussed warning signs or symptoms. Please see discharge instructions. Patient expresses understanding.   The above documentation has been reviewed and is accurate and complete Wesley Mccarthy, M.D.

## 2023-07-14 NOTE — Patient Instructions (Addendum)
Thank you for coming in today.   You received an injection today. Seek immediate medical attention if the joint becomes red, extremely painful, or is oozing fluid.   I recommend you obtained a compression sleeve to help with your joint problems. There are many options on the market however I recommend obtaining a Full Elbow Body Helix compression sleeve.  You can find information (including how to appropriate measure yourself for sizing) can be found at www.Body [employer]ap[employer]Wifi.ch.  Many of these products are health savings account (HSA) eligible.   You can use the compression sleeve at any time throughout the day but is most important to use while being active as well as for 2 hours post-activity.   It is appropriate to ice following activity with the compression sleeve in place.

## 2023-07-21 ENCOUNTER — Telehealth: Payer: Self-pay | Admitting: Emergency Medicine

## 2023-07-21 ENCOUNTER — Other Ambulatory Visit: Payer: Self-pay | Admitting: *Deleted

## 2023-07-21 DIAGNOSIS — M1A9XX0 Chronic gout, unspecified, without tophus (tophi): Secondary | ICD-10-CM

## 2023-07-21 MED ORDER — ALLOPURINOL 100 MG PO TABS
100.0000 mg | ORAL_TABLET | Freq: Every day | ORAL | 1 refills | Status: DC
Start: 2023-07-21 — End: 2024-01-15

## 2023-07-21 NOTE — Telephone Encounter (Signed)
Prescription Request  07/21/2023  LOV: 06/23/2023  What is the name of the medication or equipment? allopurinol  Have you contacted your pharmacy to request a refill? Yes   Which pharmacy would you like this sent to?  [id number] [place], [place] - [address] [place] [address] Phone: [phone] Fax: [phone]    Patient notified that their request is being sent to the clinical staff for review and that they should receive a response within 2 business days.   Please advise at Mobile [phone] (mobile)

## 2023-07-21 NOTE — Telephone Encounter (Signed)
Sent new prescription to patient pharmacy

## 2023-08-13 NOTE — Progress Notes (Unsigned)
   Rubin Payor, PhD, LAT, ATC acting as a scribe for [name], MD.  Wesley Mccarthy is a 72 y.o. male who presents to [facility] at [facility] today for f/u R olecranon bursitis. Pt was last seen by Dr. Denyse Amass on 07/14/23 and the bursa was aspirated and injected and he was advised to cont compression.  Today, pt reports ***  Pertinent review of systems: ***  Relevant historical information: ***   Exam:  There were no vitals taken for this visit. General: Well Developed, well nourished, and in no acute distress.   MSK: ***    Lab and Radiology Results No results found for this or any previous visit (from the past 72 hour(s)). No results found.     Assessment and Plan: 72 y.o. male with ***   PDMP not reviewed this encounter. No orders of the defined types were placed in this encounter.  No orders of the defined types were placed in this encounter.    Discussed warning signs or symptoms. Please see discharge instructions. Patient expresses understanding.   ***

## 2023-08-16 ENCOUNTER — Encounter: Payer: Self-pay | Admitting: Family Medicine

## 2023-08-16 ENCOUNTER — Ambulatory Visit: Payer: Medicare Other | Admitting: Family Medicine

## 2023-08-16 VITALS — BP 144/88 | HR 77 | Ht 70.0 in | Wt 244.0 lb

## 2023-08-16 DIAGNOSIS — M7021 Olecranon bursitis, right elbow: Secondary | ICD-10-CM

## 2023-08-16 NOTE — Patient Instructions (Signed)
Thank you for coming in today. Recheck as needed.    

## 2023-09-13 DIAGNOSIS — N1832 Chronic kidney disease, stage 3b: Secondary | ICD-10-CM | POA: Diagnosis not present

## 2023-09-15 DIAGNOSIS — R809 Proteinuria, unspecified: Secondary | ICD-10-CM | POA: Diagnosis not present

## 2023-09-15 DIAGNOSIS — I129 Hypertensive chronic kidney disease with stage 1 through stage 4 chronic kidney disease, or unspecified chronic kidney disease: Secondary | ICD-10-CM | POA: Diagnosis not present

## 2023-09-15 DIAGNOSIS — D631 Anemia in chronic kidney disease: Secondary | ICD-10-CM | POA: Diagnosis not present

## 2023-09-15 DIAGNOSIS — N2581 Secondary hyperparathyroidism of renal origin: Secondary | ICD-10-CM | POA: Diagnosis not present

## 2023-09-15 DIAGNOSIS — E876 Hypokalemia: Secondary | ICD-10-CM | POA: Diagnosis not present

## 2023-09-15 DIAGNOSIS — N1832 Chronic kidney disease, stage 3b: Secondary | ICD-10-CM | POA: Diagnosis not present

## 2023-09-23 ENCOUNTER — Telehealth: Payer: Self-pay | Admitting: Physician Assistant

## 2023-09-23 NOTE — Telephone Encounter (Signed)
Returned call to patient due to complaint of having food stuck in his throat. Patient had a esophagus dilation in June, 2024 per Dr. Adela Lank. Patient states he was eating ribs when food became stuck in his throat and had some time of trying to get food up. Appt for OV on 09/30/23 with Ms. Boone Master, PA. Patient instructed to go to the ED if has any breathing difficulty or SOB noted.

## 2023-09-23 NOTE — Telephone Encounter (Signed)
Patient's wife called stated he has food getting stuck in his throat. Please advise.

## 2023-09-30 ENCOUNTER — Encounter: Payer: Self-pay | Admitting: Gastroenterology

## 2023-09-30 ENCOUNTER — Ambulatory Visit: Payer: Medicare Other | Admitting: Gastroenterology

## 2023-09-30 VITALS — BP 126/72 | HR 70 | Ht 70.0 in | Wt 233.0 lb

## 2023-09-30 DIAGNOSIS — R1319 Other dysphagia: Secondary | ICD-10-CM

## 2023-09-30 DIAGNOSIS — K219 Gastro-esophageal reflux disease without esophagitis: Secondary | ICD-10-CM

## 2023-09-30 DIAGNOSIS — Z8719 Personal history of other diseases of the digestive system: Secondary | ICD-10-CM

## 2023-09-30 DIAGNOSIS — N1832 Chronic kidney disease, stage 3b: Secondary | ICD-10-CM | POA: Diagnosis not present

## 2023-09-30 NOTE — Patient Instructions (Signed)
Please call us if you have any other issues.   Dysphagia  Dysphagia is trouble swallowing. This condition occurs when solids and liquids stick in a person's throat on the way down to the stomach, or when food takes longer to get to the stomach than usual. You may have problems swallowing food, liquids, or both. You may also have pain while trying to swallow. It may take you more time and effort to swallow something. What are the causes? This condition may be caused by: Muscle problems. These may make it difficult for you to move food and liquids through the esophagus, which is the tube that connects your mouth to your stomach. Blockages. You may have ulcers, scar tissue, or inflammation that blocks the normal passage of food and liquids. Causes of these problems include: Acid reflux from your stomach into your esophagus (gastroesophageal reflux). Infections. Radiation treatment for cancer. Medicines taken without enough fluids to wash them down into your stomach. Stroke. This can affect the nerves and make it difficult to swallow. Nerve problems. These prevent signals from being sent to the muscles of your esophagus to squeeze (contract) and move what you swallow down to your stomach. Globus pharyngeus. This is a common problem that involves a feeling like something is stuck in your throat or a sense of trouble with swallowing, even though nothing is wrong with the swallowing passages. Certain conditions, such as cerebral palsy or Parkinson's disease. What are the signs or symptoms? Common symptoms of this condition include: A feeling that solids or liquids are stuck in your throat on the way down to the stomach. Pain while swallowing. Coughing or gagging while trying to swallow. Other symptoms include: Food moving back from your stomach to your mouth (regurgitation). Noises coming from your throat. Chest discomfort when swallowing. A feeling of fullness when swallowing. Drooling,  especially when the throat is blocked. Heartburn. How is this diagnosed? This condition may be diagnosed by: Barium swallow X-ray. In this test, you will swallow a white liquid that sticks to the inside of your esophagus. X-ray images are then taken. Endoscopy. In this test, a flexible telescope is inserted down your throat to look at your esophagus and your stomach. CT scans or an MRI. How is this treated? Treatment for dysphagia depends on the cause of this condition: If the dysphagia is caused by acid reflux or infection, medicines may be used. These may include antibiotics or heartburn medicines. If the dysphagia is caused by problems with the muscles, swallowing therapy may be used to help you strengthen your swallowing muscles. You may have to do specific exercises to strengthen the muscles or stretch them. If the dysphagia is caused by a blockage or mass, procedures to remove the blockage may be done. You may need surgery and a feeding tube. You may need to make diet changes. Ask your health care provider for specific instructions. Follow these instructions at home: Medicines Take over-the-counter and prescription medicines only as told by your health care provider. If you were prescribed an antibiotic medicine, take it as told by your health care provider. Do not stop taking the antibiotic even if you start to feel better. Eating and drinking  Make any diet changes as told by your health care provider. Work with a diet and nutrition specialist (dietitian) to create an eating plan that will help you get the nutrients you need in order to stay healthy. Eat soft foods that are easier to swallow. Cut your food into small pieces and  eat slowly. Take small bites. Eat and drink only when you are sitting upright. Do not drink alcohol or caffeine. If you need help quitting, ask your health care provider. General instructions Check your weight every day to make sure you are not losing  weight. Do not use any products that contain nicotine or tobacco. These products include cigarettes, chewing tobacco, and vaping devices, such as e-cigarettes. If you need help quitting, ask your health care provider. Keep all follow-up visits. This is important. Contact a health care provider if: You lose weight because you cannot swallow. You cough when you drink liquids. You cough up partially digested food. Get help right away if: You cannot swallow your saliva. You have shortness of breath, a fever, or both. Your voice is hoarse and you have trouble swallowing. These symptoms may represent a serious problem that is an emergency. Do not wait to see if the symptoms will go away. Get medical help right away. Call your local emergency services (911 in the [place] Do not drive yourself to the hospital. Summary Dysphagia is trouble swallowing. This condition occurs when solids and liquids stick in a person's throat on the way down to the stomach. You may cough or gag while trying to swallow. Dysphagia has many possible causes. Treatment for dysphagia depends on the cause of the condition. Keep all follow-up visits. This is important. This information is not intended to replace advice given to you by your health care provider. Make sure you discuss any questions you have with your health care provider. Document Revised: 05/11/2020 Document Reviewed: 05/11/2020 Elsevier Patient Education  2024 [employer]  _______________________________________________________  If your blood pressure at your visit was 140/90 or greater, please contact your primary care physician to follow up on this.  _______________________________________________________  If you are age 57 or older, your body mass index should be between 23-30. Your Body mass index is 33.43 kg/m. If this is out of the aforementioned range listed, please consider follow up with your Primary Care Provider.  If you are age 64 or younger,  your body mass index should be between 19-25. Your Body mass index is 33.43 kg/m. If this is out of the aformentioned range listed, please consider follow up with your Primary Care Provider.   ________________________________________________________  The [facility] providers would like to encourage you to use [facility] to communicate with providers for non-urgent requests or questions.  Due to long hold times on the telephone, sending your provider a message by [facility] may be a faster and more efficient way to get a response.  Please allow 48 business hours for a response.  Please remember that this is for non-urgent requests.  _______________________________________________________ It was a pleasure to see you today!  Thank you for trusting me with your gastrointestinal care!

## 2023-09-30 NOTE — Progress Notes (Signed)
Chief Complaint: Esophageal dysphagia Primary GI Doctor: Dr. Adela Lank  HPI:  Wesley Mccarthy is a 72 year old male, known to Dr. Adela Lank, with a past medical history as listed below including CKD, epilepsy and esophageal obstruction with history of GERD, who presents to clinic today for complaint of dysphagia.   On 05/07/2023 patient followed up with Jennifer/PA in our office after endoscopy with dilatation and at that time patient was doing well.  On 04/04/2023 patient had endoscopy with dilatation with Dr. Adela Lank for dysphagia.  Interval History:   Patient reports he had one episode last week where he ate a piece of meat where it got stuck and he regurgitated it back up. Since then he has not had another episode.  Patient does admit he was eating very quickly and not thoroughly chewing his food. Since this episode he has been taking his time eating and making sure he chews his food thoroughly.  He is currently taking Omeprazole 20mg  po daily as prescribed.  Denies symptoms of pyrosis or regurgitation.  No other GI complaints at this time.  Nuys nausea or vomiting.  Denies abdominal pain, altered bowel habits or rectal bleeding.  Denies. hematemesis  Past Medical History:  Diagnosis Date   Allergy    Arthritis    CKD (chronic kidney disease) stage 3, GFR 30-59 ml/min (HCC)    Epilepsy, unspecified, not intractable, without status epilepticus (HCC)    Esophageal obstruction    GERD (gastroesophageal reflux disease)    Hyperlipidemia    Hypertension    Osteoarthritis 2018   knee   Seizures (HCC)     Past Surgical History:  Procedure Laterality Date   APPENDECTOMY     TONSILLECTOMY     UPPER GASTROINTESTINAL ENDOSCOPY      Current Outpatient Medications  Medication Sig Dispense Refill   allopurinol (ZYLOPRIM) 100 MG tablet Take 1 tablet (100 mg total) by mouth daily. 90 tablet 1   amLODipine (NORVASC) 5 MG tablet Take 5 mg by mouth daily.     JARDIANCE 10 MG TABS tablet Take  10 mg by mouth daily.     omeprazole (PRILOSEC) 20 MG capsule Take 1 capsule (20 mg total) by mouth daily. 90 capsule 3   PHENobarbital (LUMINAL) 64.8 MG tablet Take 1 tablet daily 90 tablet 3   polyethylene glycol powder (GLYCOLAX/MIRALAX) powder Take 17 g by mouth 2 (two) times daily as needed. 3350 g 1   Potassium Chloride ER 20 MEQ TBCR Take 1 tablet by mouth daily.     rosuvastatin (CRESTOR) 20 MG tablet TAKE ONE TABLET BY MOUTH DAILY 90 tablet 3   valsartan-hydrochlorothiazide (DIOVAN-HCT) 320-12.5 MG tablet Take 1 tablet by mouth daily. 90 tablet 3   No current facility-administered medications for this visit.    Allergies as of 09/30/2023 - Review Complete 09/30/2023  Allergen Reaction Noted   Norvasc [amlodipine besylate]  11/11/2011   Penicillins  08/11/2011    Family History  Problem Relation Age of Onset   Heart disease Mother        Had a CABG   Colon cancer Father    Seizures Daughter    Diabetes Paternal Aunt    Stomach cancer Neg Hx    Esophageal cancer Neg Hx    Rectal cancer Neg Hx    Liver cancer Neg Hx     Review of Systems:    Constitutional: No weight loss, fever, chills, weakness or fatigue HEENT: Eyes: No change in vision  Ears, Nose, Throat:  No change in hearing or congestion Skin: No rash or itching Cardiovascular: No chest pain, chest pressure or palpitations   Respiratory: No SOB or cough Gastrointestinal: See HPI and otherwise negative Genitourinary: No dysuria or change in urinary frequency Neurological: No headache, dizziness or syncope Musculoskeletal: No new muscle or joint pain Hematologic: No bleeding or bruising Psychiatric: No history of depression or anxiety    Physical Exam:  Vital signs: BP 126/72   Pulse 70   Ht 5\' 10"  (1.778 m)   Wt 233 lb (105.7 kg)   SpO2 98%   BMI 33.43 kg/m   Constitutional:   Pleasant  male appears to be in NAD, Well developed, Well nourished, alert and cooperative Neck:   Supple Throat: Oral cavity and pharynx without inflammation, swelling or lesion.  Respiratory: Respirations even and unlabored. Lungs clear to auscultation bilaterally.   No wheezes, crackles, or rhonchi.  Cardiovascular: Normal S1, S2. No MRG. Regular rate and rhythm. No peripheral edema, cyanosis or pallor.  Gastrointestinal:  Soft, nondistended, nontender. No rebound or guarding. Normal bowel sounds. No appreciable masses or hepatomegaly. Rectal:  Not performed.  Skin:   Dry and intact without significant lesions or rashes. Psychiatric: Oriented to person, place and time. Demonstrates good judgement and reason without abnormal affect or behaviors.  RELEVANT LABS AND IMAGING: CBC    Component Value Date/Time   WBC 5.3 02/23/2023 1753   RBC 4.73 02/23/2023 1753   HGB 13.8 02/23/2023 1753   HCT 44.4 02/23/2023 1753   PLT 190 02/23/2023 1753   MCV 93.9 02/23/2023 1753   MCV 96.2 07/24/2014 1114   MCH 29.2 02/23/2023 1753   MCHC 31.1 02/23/2023 1753   RDW 12.9 02/23/2023 1753   LYMPHSABS 1.2 02/23/2023 1753   MONOABS 0.5 02/23/2023 1753   EOSABS 0.4 02/23/2023 1753   BASOSABS 0.1 02/23/2023 1753    CMP     Component Value Date/Time   NA 137 02/23/2023 1753   NA 142 06/11/2017 1125   K 3.5 02/23/2023 1753   CL 101 02/23/2023 1753   CO2 25 02/23/2023 1753   GLUCOSE 99 02/23/2023 1753   BUN 19 02/23/2023 1753   BUN 21 06/11/2017 1125   CREATININE 1.96 (H) 02/23/2023 1753   CREATININE 1.53 (H) 05/13/2016 1753   CALCIUM 9.4 02/23/2023 1753   PROT 7.8 11/03/2022 0849   ALBUMIN 4.1 11/03/2022 0849   AST 22 11/03/2022 0849   ALT 18 11/03/2022 0849   ALKPHOS 145 (H) 11/03/2022 0849   BILITOT 0.4 11/03/2022 0849   GFRNONAA 36 (L) 02/23/2023 1753   GFRNONAA 55 (L) 12/18/2014 1247   GFRAA 51 (L) 06/11/2017 1125   GFRAA 63 12/18/2014 1247   03/08/2023 EGD Impression:  - Esophagogastric landmarks identified. - 4 cm hiatal hernia.  - Benign- appearing esophageal stenosis.  Dilated to 19mm with good result.  - A few gastric polyps. Benign fundic gland polyps.  - Normal stomach otherwise.  - Normal examined duodenum.   04/22/2020 colonoscopy with diverticulosis in the transverse colon, 5 mm polyp in the sigmoid colon and 2 to 3-4 mm polyps at the rectosigmoid colon, internal hemorrhoids.  Repeat recommended in 5 years.  Pathology showed hyperplastic polyps.     Encounter Diagnoses  Name Primary?   Esophageal dysphagia Yes   Gastroesophageal reflux disease, unspecified whether esophagitis present    History of esophageal stricture     Assessment: 72 year old male patient with history of GERD and GEJ stricture treated with  balloon dilation.  Patient presented after 1 single episode of dysphagia with dry piece of meat but admits he was eating quickly and not chewing food thoroughly.  Patient has not had any episodes prior since his dilatation on 03/08/2023.  Patient is currently on PPI therapy.  Does not wish to pursue another endoscopy at this time and will contact the office if his symptoms recur.  Plan: -Continue omeprazole 20mg  po daily -Dysphagia handout given, discussed dietary modifcations -Patient will call clinic with any further symptoms.   Leyani Gargus, FNP-C [facility] 09/30/2023, 5:37 PM  Cc: Georgina Quint, *

## 2023-10-01 NOTE — Progress Notes (Signed)
Agree with assessment and plan as outlined. He can follow up as needed for repeat dilation if symptoms become more frequent or bothersome.

## 2023-11-24 NOTE — Progress Notes (Cosign Needed Addendum)
 This encounter was created in error - please disregard. Called [facility]; LDM for pt to call office to reschedule visit.  Medical screening examination/treatment/procedure(s) were performed by non-physician practitioner and as supervising physician I was immediately available for consultation/collaboration.  I agree with above. Jacinta Shoe, MD

## 2023-12-15 ENCOUNTER — Other Ambulatory Visit: Payer: Self-pay | Admitting: Emergency Medicine

## 2023-12-15 DIAGNOSIS — E785 Hyperlipidemia, unspecified: Secondary | ICD-10-CM

## 2024-01-15 ENCOUNTER — Other Ambulatory Visit: Payer: Self-pay | Admitting: Emergency Medicine

## 2024-01-15 DIAGNOSIS — M1A9XX0 Chronic gout, unspecified, without tophus (tophi): Secondary | ICD-10-CM

## 2024-01-20 ENCOUNTER — Other Ambulatory Visit: Payer: Self-pay | Admitting: Emergency Medicine

## 2024-01-20 DIAGNOSIS — I10 Essential (primary) hypertension: Secondary | ICD-10-CM

## 2024-02-01 ENCOUNTER — Ambulatory Visit (INDEPENDENT_AMBULATORY_CARE_PROVIDER_SITE_OTHER)

## 2024-02-01 VITALS — Ht 70.0 in | Wt 233.0 lb

## 2024-02-01 DIAGNOSIS — Z0000 Encounter for general adult medical examination without abnormal findings: Secondary | ICD-10-CM | POA: Diagnosis not present

## 2024-02-01 NOTE — Progress Notes (Signed)
 Subjective:   Wesley Mccarthy is a 73 y.o. who presents for a Medicare Wellness preventive visit.  Visit Complete: Virtual I connected with  Wesley Mccarthy on 02/01/24 by a audio enabled telemedicine application and verified that I am speaking with the correct person using two identifiers.  Patient Location: Home  Provider Location: Home Office  I discussed the limitations of evaluation and management by telemedicine. The patient expressed understanding and agreed to proceed.  Vital Signs: Because this visit was a virtual/telehealth visit, some criteria may be missing or patient reported. Any vitals not documented were not able to be obtained and vitals that have been documented are patient reported.  VideoDeclined- This patient declined Librarian, academic. Therefore the visit was completed with audio only.  Persons Participating in Visit: Patient.  AWV Questionnaire: No: Patient Medicare AWV questionnaire was not completed prior to this visit.  Cardiac Risk Factors include: advanced age (>36men, >36 women);male gender;hypertension;obesity (BMI >30kg/m2);Other (see comment);dyslipidemia, Risk factor comments: CKD,     Objective:    Today's Vitals   02/01/24 0853  Weight: 233 lb (105.7 kg)  Height: 5\' 10"  (1.778 m)   Body mass index is 33.43 kg/m.     02/01/2024    8:58 AM 07/08/2023    8:49 AM 10/20/2022    2:35 PM 10/06/2016    2:14 PM  Advanced Directives  Does Patient Have a Medical Advance Directive? No No No No  Would patient like information on creating a medical advance directive?   No - Patient declined     Current Medications (verified) Outpatient Encounter Medications as of 02/01/2024  Medication Sig   allopurinol  (ZYLOPRIM ) 100 MG tablet TAKE 1 TABLET BY MOUTH DAILY   amLODipine  (NORVASC ) 5 MG tablet Take 5 mg by mouth daily.   JARDIANCE 10 MG TABS tablet Take 10 mg by mouth daily.   omeprazole  (PRILOSEC) 20 MG capsule Take 1 capsule (20  mg total) by mouth daily.   PHENobarbital  (LUMINAL) 64.8 MG tablet Take 1 tablet daily   polyethylene glycol powder (GLYCOLAX /MIRALAX ) powder Take 17 g by mouth 2 (two) times daily as needed.   Potassium Chloride  ER 20 MEQ TBCR Take 1 tablet by mouth daily.   rosuvastatin  (CRESTOR ) 20 MG tablet TAKE 1 TABLET BY MOUTH DAILY   valsartan -hydrochlorothiazide  (DIOVAN -HCT) 320-12.5 MG tablet TAKE 1 TABLET BY MOUTH DAILY   No facility-administered encounter medications on file as of 02/01/2024.    Allergies (verified) Norvasc  [amlodipine  besylate] and Penicillins   History: Past Medical History:  Diagnosis Date   Allergy    Arthritis    CKD (chronic kidney disease) stage 3, GFR 30-59 ml/min (HCC)    Epilepsy, unspecified, not intractable, without status epilepticus (HCC)    Esophageal obstruction    GERD (gastroesophageal reflux disease)    Hyperlipidemia    Hypertension    Osteoarthritis 2018   knee   Seizures (HCC)    Past Surgical History:  Procedure Laterality Date   APPENDECTOMY     TONSILLECTOMY     UPPER GASTROINTESTINAL ENDOSCOPY     Family History  Problem Relation Age of Onset   Heart disease Mother        Had a CABG   Colon cancer Father    Seizures Daughter    Diabetes Paternal Aunt    Stomach cancer Neg Hx    Esophageal cancer Neg Hx    Rectal cancer Neg Hx    Liver cancer Neg Hx    Social  History   Socioeconomic History   Marital status: Married    Spouse name: Ozie Bo   Number of children: 3   Years of education: Not on file   Highest education level: Not on file  Occupational History   Occupation: [employer]   Occupation: [employer]   Occupation: [employer] RET[employer]  Tobacco Use   Smoking status: Never   Smokeless tobacco: Never  Vaping Use   Vaping status: Never Used  Substance and Sexual Activity   Alcohol use: Yes    Comment: 1-2 per year   Drug use: No   Sexual activity: Yes    Birth control/protection: None  Other Topics Concern    Not on file  Social History Narrative   Married. Education: [employer].    Right handed       Lives with wife   Social Drivers of Health   Financial Resource Strain: Low Risk  (02/01/2024)   Overall Financial Resource Strain (CARDIA)    Difficulty of Paying Living Expenses: Not hard at all  Food Insecurity: No Food Insecurity (02/01/2024)   Hunger Vital Sign    Worried About Running Out of Food in the Last Year: Never true    Ran Out of Food in the Last Year: Never true  Transportation Needs: No Transportation Needs (02/01/2024)   PRAPARE - Administrator, Civil Service (Medical): No    Lack of Transportation (Non-Medical): No  Physical Activity: Inactive (02/01/2024)   Exercise Vital Sign    Days of Exercise per Week: 0 days    Minutes of Exercise per Session: 0 min  Stress: No Stress Concern Present (02/01/2024)   [employer] of Occupational Health - Occupational Stress Questionnaire    Feeling of Stress : Not at all  Social Connections: Moderately Isolated (02/01/2024)   Social Connection and Isolation Panel [NHANES]    Frequency of Communication with Friends and Family: Three times a week    Frequency of Social Gatherings with Friends and Family: Never    Attends Religious Services: Never    Database administrator or Organizations: No    Attends Engineer, structural: Never    Marital Status: Married    Tobacco Counseling Counseling given: Not Answered    Clinical Intake:  Pre-visit preparation completed: Yes  Pain : No/denies pain     BMI - recorded: 33.43 Nutritional Status: BMI > 30  Obese Nutritional Risks: None Diabetes: No  Lab Results  Component Value Date   HGBA1C 6.6 (H) 11/03/2022   HGBA1C 6.5 12/29/2021     How often do you need to have someone help you when you read instructions, pamphlets, or other written materials from your doctor or pharmacy?: 1 - Never  Interpreter Needed?: No  Information entered by :: Chistian Kasler, RMA   Activities of Daily Living     02/01/2024    8:56 AM  In your present state of health, do you have any difficulty performing the following activities:  Hearing? 0  Vision? 0  Difficulty concentrating or making decisions? 0  Walking or climbing stairs? 0  Dressing or bathing? 0  Doing errands, shopping? 0  Preparing Food and eating ? N  Using the Toilet? N  In the past six months, have you accidently leaked urine? N  Do you have problems with loss of bowel control? N  Managing your Medications? N  Managing your Finances? N  Housekeeping or managing your Housekeeping? N    Patient Care  Team: Sagardia, Miguel Jose, MD as PCP - General (Internal Medicine) Nahser, Lela Purple, MD as PCP - Cardiology (Cardiology) Buck Carbon, My [place], [place] as Referring Physician (Optometry) Ty Gales Lyna Sandhoff, MD as Consulting Physician (Neurology)  Indicate any recent Medical Services you may have received from other than [facility] providers in the past year (date may be approximate).     Assessment:   This is a routine wellness examination for [facility].  Hearing/Vision screen Hearing Screening - Comments:: Denies hearing difficulties   Vision Screening - Comments:: Wears eyeglasses   Goals Addressed             This Visit's Progress    Client understands the importance of follow-up with providers by attending scheduled visits.   On track      Depression Screen     02/01/2024    9:00 AM 06/23/2023    2:49 PM 02/24/2023    2:27 PM 02/08/2023    3:50 PM 11/03/2022    8:07 AM 10/20/2022    2:38 PM 06/30/2022    9:04 AM  PHQ 2/9 Scores  PHQ - 2 Score 0 0 0 0 0 0 0  PHQ- 9 Score 0          Fall Risk     02/01/2024    8:58 AM 07/08/2023    8:48 AM 06/23/2023    2:49 PM 02/24/2023    2:27 PM 02/08/2023    3:50 PM  Fall Risk   Falls in the past year? 0 0 0 0 0  Number falls in past yr: 0 0 0 0 0  Injury with Fall? 0 0 0 0 0  Risk for fall due to : No Fall Risks  No Fall Risks No Fall Risks No Fall  Risks  Follow up Falls prevention discussed;Falls evaluation completed Falls evaluation completed Falls evaluation completed Falls evaluation completed Falls evaluation completed    MEDICARE RISK AT HOME:  Medicare Risk at Home Any stairs in or around the home?: Yes If so, are there any without handrails?: Yes Home free of loose throw rugs in walkways, pet beds, electrical cords, etc?: Yes Adequate lighting in your home to reduce risk of falls?: Yes Life alert?: No Use of a cane, walker or w/c?: No Grab bars in the bathroom?: No Shower chair or bench in shower?: No Elevated toilet seat or a handicapped toilet?: No  TIMED UP AND GO:  Was the test performed?  No  Cognitive Function: Declined/Normal: No cognitive concerns noted by patient or family. Patient alert, oriented, able to answer questions appropriately and recall recent events. No signs of memory loss or confusion.        10/20/2022    2:38 PM  6CIT Screen  What Year? 0 points  What month? 0 points  What time? 0 points  Count back from 20 0 points  Months in reverse 0 points  Repeat phrase 0 points  Total Score 0 points    Immunizations Immunization History  Administered Date(s) Administered   Fluad Quad(high Dose 65+) 06/30/2022, 06/21/2023   Influenza Split 08/07/2011, 08/26/2012   Influenza,inj,Quad PF,6+ Mos 07/24/2013, 07/24/2014, 06/20/2015, 12/01/2016   PFIZER(Purple Top)SARS-COV-2 Vaccination 11/28/2019, 12/19/2019, 08/12/2020   Pfizer(Comirnaty)Fall Seasonal Vaccine 12 years and older 07/06/2022   Pneumococcal Polysaccharide-23 09/30/2019   Td 07/08/2023   Tdap 03/19/2016   Zoster Recombinant(Shingrix) 07/06/2022, 09/15/2022    Screening Tests Health Maintenance  Topic Date Due   Hepatitis C Screening  Never done   Pneumonia Vaccine  64+ Years old (2 of 2 - PCV) 09/29/2020   COVID-19 Vaccine (5 - 2024-25 season) 06/06/2023   INFLUENZA VACCINE  05/05/2024   Medicare Annual Wellness (AWV)   01/31/2025   Colonoscopy  04/22/2030   DTaP/Tdap/Td (3 - Td or Tdap) 07/07/2033   Zoster Vaccines- Shingrix  Completed   HPV VACCINES  Aged Out   Meningococcal B Vaccine  Aged Out    Health Maintenance  Health Maintenance Due  Topic Date Due   Hepatitis C Screening  Never done   Pneumonia Vaccine 45+ Years old (2 of 2 - PCV) 09/29/2020   COVID-19 Vaccine (5 - 2024-25 season) 06/06/2023   Health Maintenance Items Addressed: See Nurse Notes  Additional Screening:  Vision Screening: Recommended annual ophthalmology exams for early detection of glaucoma and other disorders of the eye.  Dental Screening: Recommended annual dental exams for proper oral hygiene  Community Resource Referral / Chronic Care Management: CRR required this visit?  No   CCM required this visit?  No     Plan:     I have personally reviewed and noted the following in the patient's chart:   Medical and social history Use of alcohol, tobacco or illicit drugs  Current medications and supplements including opioid prescriptions. Patient is not currently taking opioid prescriptions. Functional ability and status Nutritional status Physical activity Advanced directives List of other physicians Hospitalizations, surgeries, and ER visits in previous 12 months Vitals Screenings to include cognitive, depression, and falls Referrals and appointments  In addition, I have reviewed and discussed with patient certain preventive protocols, quality metrics, and best practice recommendations. A written personalized care plan for preventive services as well as general preventive health recommendations were provided to patient.     Harshita Bernales L Johne Buckle, CMA   02/01/2024   After Visit Summary: (Mail) Due to this being a telephonic visit, the after visit summary with patients personalized plan was offered to patient via mail   Notes: Please refer to Routing Comments.

## 2024-02-01 NOTE — Patient Instructions (Signed)
 Wesley Mccarthy , Thank you for taking time to come for your Medicare Wellness Visit. I appreciate your ongoing commitment to your health goals. Please review the following plan we discussed and let me know if I can assist you in the future.   Referrals/Orders/Follow-Ups/Clinician Recommendations: It was nice talking with you today.  You are due for a Pneumonia vaccine and can get that done at any local pharmacy.  Aim for 30 minutes of exercise or brisk walking, 6-8 glasses of water, and 5 servings of fruits and vegetables each day.   This is a list of the screening recommended for you and due dates:  Health Maintenance  Topic Date Due   Hepatitis C Screening  Never done   Pneumonia Vaccine (2 of 2 - PCV) 09/29/2020   COVID-19 Vaccine (5 - 2024-25 season) 06/06/2023   Flu Shot  05/05/2024   Medicare Annual Wellness Visit  01/31/2025   Colon Cancer Screening  04/22/2030   DTaP/Tdap/Td vaccine (3 - Td or Tdap) 07/07/2033   Zoster (Shingles) Vaccine  Completed   HPV Vaccine  Aged Out   Meningitis B Vaccine  Aged Out    Advanced directives: (Declined) Advance directive discussed with you today. Even though you declined this today, please call our office should you change your mind, and we can give you the proper paperwork for you to fill out.  Next Medicare Annual Wellness Visit scheduled for next year: Yes

## 2024-03-13 ENCOUNTER — Other Ambulatory Visit: Payer: Self-pay | Admitting: Neurology

## 2024-03-13 DIAGNOSIS — G40909 Epilepsy, unspecified, not intractable, without status epilepticus: Secondary | ICD-10-CM

## 2024-04-03 DIAGNOSIS — N1832 Chronic kidney disease, stage 3b: Secondary | ICD-10-CM | POA: Diagnosis not present

## 2024-04-19 ENCOUNTER — Other Ambulatory Visit: Payer: Self-pay | Admitting: Physician Assistant

## 2024-04-19 DIAGNOSIS — K2970 Gastritis, unspecified, without bleeding: Secondary | ICD-10-CM

## 2024-05-25 ENCOUNTER — Encounter: Payer: Self-pay | Admitting: Internal Medicine

## 2024-05-25 ENCOUNTER — Ambulatory Visit: Payer: Self-pay

## 2024-05-25 ENCOUNTER — Ambulatory Visit: Payer: Self-pay | Admitting: Internal Medicine

## 2024-05-25 ENCOUNTER — Ambulatory Visit: Admitting: Internal Medicine

## 2024-05-25 ENCOUNTER — Ambulatory Visit (INDEPENDENT_AMBULATORY_CARE_PROVIDER_SITE_OTHER)

## 2024-05-25 VITALS — BP 142/94 | HR 78 | Temp 98.1°F | Ht 70.0 in | Wt 237.0 lb

## 2024-05-25 DIAGNOSIS — D631 Anemia in chronic kidney disease: Secondary | ICD-10-CM | POA: Diagnosis not present

## 2024-05-25 DIAGNOSIS — I771 Stricture of artery: Secondary | ICD-10-CM | POA: Diagnosis not present

## 2024-05-25 DIAGNOSIS — R062 Wheezing: Secondary | ICD-10-CM

## 2024-05-25 DIAGNOSIS — N1832 Chronic kidney disease, stage 3b: Secondary | ICD-10-CM | POA: Diagnosis not present

## 2024-05-25 DIAGNOSIS — R809 Proteinuria, unspecified: Secondary | ICD-10-CM | POA: Diagnosis not present

## 2024-05-25 DIAGNOSIS — R051 Acute cough: Secondary | ICD-10-CM

## 2024-05-25 DIAGNOSIS — E785 Hyperlipidemia, unspecified: Secondary | ICD-10-CM | POA: Diagnosis not present

## 2024-05-25 DIAGNOSIS — I10 Essential (primary) hypertension: Secondary | ICD-10-CM | POA: Diagnosis not present

## 2024-05-25 DIAGNOSIS — I129 Hypertensive chronic kidney disease with stage 1 through stage 4 chronic kidney disease, or unspecified chronic kidney disease: Secondary | ICD-10-CM | POA: Diagnosis not present

## 2024-05-25 DIAGNOSIS — R059 Cough, unspecified: Secondary | ICD-10-CM | POA: Insufficient documentation

## 2024-05-25 DIAGNOSIS — N2581 Secondary hyperparathyroidism of renal origin: Secondary | ICD-10-CM | POA: Diagnosis not present

## 2024-05-25 MED ORDER — HYDROCODONE BIT-HOMATROP MBR 5-1.5 MG/5ML PO SOLN: 5.0000 mL | Freq: Four times a day (QID) | ORAL | 0 refills | Status: AC | PRN

## 2024-05-25 MED ORDER — AZITHROMYCIN 250 MG PO TABS: ORAL_TABLET | ORAL | 1 refills | Status: AC

## 2024-05-25 MED ORDER — PREDNISONE 10 MG PO TABS
ORAL_TABLET | ORAL | 0 refills | Status: DC
Start: 2024-05-25 — End: 2024-07-07

## 2024-05-25 MED ORDER — ALBUTEROL SULFATE HFA 108 (90 BASE) MCG/ACT IN AERS: 2.0000 | INHALATION_SPRAY | Freq: Four times a day (QID) | RESPIRATORY_TRACT | 1 refills | Status: AC | PRN

## 2024-05-25 NOTE — Assessment & Plan Note (Signed)
 Mild to mod, for prednisone  taper, and inhaler prn,  to f/u any worsening symptoms or concerns

## 2024-05-25 NOTE — Telephone Encounter (Signed)
 FYI Only or Action Required?: FYI only for provider.  Patient was last seen in primary care on 07/08/2023 by Elnor Lauraine BRAVO, NP.  Called Nurse Triage reporting Cough.  Symptoms began several weeks ago.  Interventions attempted: OTC medications: Chestal cough syrup.  Symptoms are: nonproductive cough, mild SOB with exertion or after coughing, wheezing at night, nasal congestion, bilateral feet swelling (wife states it is chronic) unchanged.  Triage Disposition: See HCP Within 4 Hours (Or PCP Triage)  Patient/caregiver understands and will follow disposition?: Yes            Copied from CRM #[id number]. Topic: Clinical - Red Word Triage >> May 25, 2024 10:37 AM Harlene ORN wrote: Red Word that prompted transfer to Nurse Triage: coughing for two weeks not going away even with OTC medicine. Wheezing. Reason for Disposition  [1] MILD difficulty breathing (e.g., minimal/no SOB at rest, SOB with walking, pulse < 100) AND [2] still present when not coughing  Answer Assessment - Initial Assessment Questions 1. ONSET: When did the cough begin?      2 weeks ago.  2. SEVERITY: How bad is the cough today?      She states it is not getting better. She states the patient seems short winded after a big coughing spell and states it is worse at night. She states she tells him to prop himself up with pillows and take some cough medications.  3. SPUTUM: Describe the color of your sputum (e.g., none, dry cough; clear, white, yellow, green)     None.  4. HEMOPTYSIS: Are you coughing up any blood? If Yes, ask: How much? (e.g., flecks, streaks, tablespoons, etc.)     No.  5. DIFFICULTY BREATHING: Are you having difficulty breathing? If Yes, ask: How bad is it? (e.g., mild, moderate, severe)      She states he has SOB with his coughing spells. She states she thinks he also seems SOB when exerting himself over the past 2 weeks.  6. FEVER: Do you have a fever? If Yes, ask: What is your  temperature, how was it measured, and when did it start?     No.  7. CARDIAC HISTORY: Do you have any history of heart disease? (e.g., heart attack, congestive heart failure)      No.  8. LUNG HISTORY: Do you have any history of lung disease?  (e.g., pulmonary embolus, asthma, emphysema)     No.  9. PE RISK FACTORS: Do you have a history of blood clots? (or: recent major surgery, recent prolonged travel, bedridden)     No.  10. OTHER SYMPTOMS: Do you have any other symptoms? (e.g., runny nose, wheezing, chest pain)       Nasal congestion, wheezing at night when sleeping or when he falls asleep in his recliner, feet swelling (she states he always has that). Denies any chest pain, fever.  11. PREGNANCY: Is there any chance you are pregnant? When was your last menstrual period?       N/A.  12. TRAVEL: Have you traveled out of the country in the last month? (e.g., travel history, exposures)       No.  Protocols used: Cough - Acute Non-Productive-A-AH

## 2024-05-25 NOTE — Assessment & Plan Note (Signed)
 BP Readings from Last 3 Encounters:  05/25/24 (!) 142/94  09/30/23 126/72  08/16/23 (!) 144/88   Uncontrolled, likely reactive,, pt to continue medical treatment norvasc  5 every day, dioan hct 320 12.5 qd

## 2024-05-25 NOTE — Patient Instructions (Signed)
 Please take all new medication as prescribed - the antibiotic, cough medicine, prednisone, and inhaler as needed  Please continue all other medications as before, and refills have been done if requested.  Please have the pharmacy call with any other refills you may need.  Please keep your appointments with your specialists as you may have planned  Please go to the XRAY Department in the first floor for the x-ray testing  You will be contacted by phone if any changes need to be made immediately.  Otherwise, you will receive a letter about your results with an explanation, but please check with MyChart first.

## 2024-05-25 NOTE — Progress Notes (Signed)
 Patient ID: Wesley Mccarthy, male   DOB: Nov 25, 1950, 73 y.o.   MRN: [id number]        Chief Complaint: follow up cough, wheezing, htn       HPI:  Wesley Mccarthy is a 73 y.o. male Here with acute onset mild to mod 2-3 days ST, HA, general weakness and malaise, with prod cough greenish sputum, but Pt denies chest pain, increased sob or doe, wheezing, orthopnea, PND, increased LE swelling, palpitations, dizziness or syncope, except for mild wheezing sob starting yesterday.   Pt denies polydipsia, polyuria, or new focal neuro s/s.          Wt Readings from Last 3 Encounters:  05/25/24 237 lb (107.5 kg)  02/01/24 233 lb (105.7 kg)  09/30/23 233 lb (105.7 kg)   BP Readings from Last 3 Encounters:  05/25/24 (!) 142/94  09/30/23 126/72  08/16/23 (!) 144/88         Past Medical History:  Diagnosis Date   Allergy    Arthritis    CKD (chronic kidney disease) stage 3, GFR 30-59 ml/min (HCC)    Epilepsy, unspecified, not intractable, without status epilepticus (HCC)    Esophageal obstruction    GERD (gastroesophageal reflux disease)    Hyperlipidemia    Hypertension    Osteoarthritis 2018   knee   Seizures (HCC)    Past Surgical History:  Procedure Laterality Date   APPENDECTOMY     TONSILLECTOMY     UPPER GASTROINTESTINAL ENDOSCOPY      reports that he has never smoked. He has never used smokeless tobacco. He reports current alcohol use. He reports that he does not use drugs. family history includes Colon cancer in his father; Diabetes in his paternal aunt; Heart disease in his mother; Seizures in his daughter. Allergies  Allergen Reactions   Norvasc  [Amlodipine  Besylate]     Per patient caused his liver to shutdown.   Penicillins    Current Outpatient Medications on File Prior to Visit  Medication Sig Dispense Refill   amLODipine  (NORVASC ) 5 MG tablet Take 5 mg by mouth daily.     omeprazole  (PRILOSEC) 20 MG capsule TAKE 1 CAPSULE BY MOUTH DAILY 90 capsule 3   PHENobarbital  (LUMINAL)  64.8 MG tablet TAKE 1 TABLET BY MOUTH DAILY 90 tablet 0   polyethylene glycol powder (GLYCOLAX /MIRALAX ) powder Take 17 g by mouth 2 (two) times daily as needed. 3350 g 1   Potassium Chloride  ER 20 MEQ TBCR Take 1 tablet by mouth daily.     rosuvastatin  (CRESTOR ) 20 MG tablet TAKE 1 TABLET BY MOUTH DAILY 90 tablet 3   valsartan -hydrochlorothiazide  (DIOVAN -HCT) 320-12.5 MG tablet TAKE 1 TABLET BY MOUTH DAILY 90 tablet 3   allopurinol  (ZYLOPRIM ) 100 MG tablet TAKE 1 TABLET BY MOUTH DAILY (Patient not taking: Reported on 05/25/2024) 90 tablet 1   JARDIANCE 10 MG TABS tablet Take 10 mg by mouth daily. (Patient not taking: Reported on 05/25/2024)     No current facility-administered medications on file prior to visit.        ROS:  All others reviewed and negative.  Objective        PE:  BP (!) 142/94   Pulse 78   Temp 98.1 F (36.7 C) (Oral)   Ht 5' 10 (1.778 m)   Wt 237 lb (107.5 kg)   SpO2 96%   BMI 34.01 kg/m                 Constitutional: Pt appears mild  ill               HENT: Head: NCAT.                Right Ear: External ear normal.                 Left Ear: External ear normal.                Eyes: . Pupils are equal, round, and reactive to light. Conjunctivae and EOM are normal               Nose: without d/c or deformity               Neck: Neck supple. Gross normal ROM               Cardiovascular: Normal rate and regular rhythm.                 Pulmonary/Chest: Effort normal and breath sounds decreased bilateral without rales but few mild wheezing.                Neurological: Pt is alert. At baseline orientation, motor grossly intact               Skin: Skin is warm. No rashes, no other new lesions, LE edema - none               Psychiatric: Pt behavior is normal without agitation   Micro: none  Cardiac tracings I have personally interpreted today:  none  Pertinent Radiological findings (summarize): none   Lab Results  Component Value Date   WBC 5.3 02/23/2023    HGB 13.8 02/23/2023   HCT 44.4 02/23/2023   PLT 190 02/23/2023   GLUCOSE 99 02/23/2023   CHOL 148 11/03/2022   TRIG 393.0 (H) 11/03/2022   HDL 27.80 (L) 11/03/2022   LDLDIRECT 54.0 11/03/2022   LDLCALC 87 06/20/2015   ALT 18 11/03/2022   AST 22 11/03/2022   NA 137 02/23/2023   K 3.5 02/23/2023   CL 101 02/23/2023   CREATININE 1.96 (H) 02/23/2023   BUN 19 02/23/2023   CO2 25 02/23/2023   TSH 3.71 11/03/2022   PSA 1.67 07/24/2013   HGBA1C 6.6 (H) 11/03/2022   Assessment/Plan:  Wesley Mccarthy is a 73 y.o. White or Caucasian [1] male with  has a past medical history of Allergy, Arthritis, CKD (chronic kidney disease) stage 3, GFR 30-59 ml/min (HCC), Epilepsy, unspecified, not intractable, without status epilepticus (HCC), Esophageal obstruction, GERD (gastroesophageal reflux disease), Hyperlipidemia, Hypertension, Osteoarthritis (2018), and Seizures (HCC).  Essential hypertension BP Readings from Last 3 Encounters:  05/25/24 (!) 142/94  09/30/23 126/72  08/16/23 (!) 144/88   Uncontrolled, likely reactive,, pt to continue medical treatment norvasc  5 every day, dioan hct 320 12.5 qd   Cough Mild to mod, c/w bronchitis vs pna, for cxr, also for antibx course zpack, cough med prn,  to f/u any worsening symptoms or concerns  Wheezing Mild to mod, for prednisone  taper, and inhaler prn,  to f/u any worsening symptoms or concerns  Followup: Return if symptoms worsen or fail to improve.  Wesley Rush, MD 05/25/2024 3:38 PM [facility]

## 2024-05-25 NOTE — Assessment & Plan Note (Signed)
 Mild to mod, c/w bronchitis vs pna, for cxr, also for antibx course zpack, cough med prn,  to f/u any worsening symptoms or concerns

## 2024-06-12 ENCOUNTER — Other Ambulatory Visit: Payer: Self-pay | Admitting: Emergency Medicine

## 2024-06-12 ENCOUNTER — Other Ambulatory Visit (HOSPITAL_COMMUNITY): Payer: Self-pay | Admitting: Nephrology

## 2024-06-12 DIAGNOSIS — R809 Proteinuria, unspecified: Secondary | ICD-10-CM

## 2024-07-04 ENCOUNTER — Other Ambulatory Visit: Payer: Self-pay | Admitting: Emergency Medicine

## 2024-07-04 DIAGNOSIS — M1A9XX0 Chronic gout, unspecified, without tophus (tophi): Secondary | ICD-10-CM

## 2024-07-05 ENCOUNTER — Other Ambulatory Visit: Payer: Self-pay | Admitting: *Deleted

## 2024-07-05 DIAGNOSIS — G40909 Epilepsy, unspecified, not intractable, without status epilepticus: Secondary | ICD-10-CM

## 2024-07-05 MED ORDER — PHENOBARBITAL 64.8 MG PO TABS: 64.8000 mg | ORAL_TABLET | Freq: Every day | ORAL | 0 refills | Status: DC

## 2024-07-07 ENCOUNTER — Ambulatory Visit: Payer: Medicare Other | Admitting: Neurology

## 2024-07-07 ENCOUNTER — Other Ambulatory Visit

## 2024-07-07 ENCOUNTER — Encounter: Payer: Self-pay | Admitting: Neurology

## 2024-07-07 VITALS — BP 127/80 | HR 80 | Ht 70.0 in | Wt 236.0 lb

## 2024-07-07 DIAGNOSIS — E550 Rickets, active: Secondary | ICD-10-CM | POA: Diagnosis not present

## 2024-07-07 DIAGNOSIS — G40909 Epilepsy, unspecified, not intractable, without status epilepticus: Secondary | ICD-10-CM | POA: Diagnosis not present

## 2024-07-07 DIAGNOSIS — Z79899 Other long term (current) drug therapy: Secondary | ICD-10-CM | POA: Diagnosis not present

## 2024-07-07 MED ORDER — PHENOBARBITAL 64.8 MG PO TABS: 64.8000 mg | ORAL_TABLET | Freq: Every day | ORAL | 4 refills | Status: AC

## 2024-07-07 NOTE — Patient Instructions (Signed)
 Good to see you.  Have bloodwork done for vitamin D level  2. Schedule bone density scan  3. Continue Phenobarbital  64.8mg  every morning  4. Continue regular PCP follow-up  5. Follow-up in 1 year, call for any changes   Seizure Precaution: 1. If medication has been prescribed for you to prevent seizures, take it exactly as directed.  Do not stop taking the medicine without talking to your doctor first, even if you have not had a seizure in a long time.   2. Avoid activities in which a seizure would cause danger to yourself or to others.  Don't operate dangerous machinery, swim alone, or climb in high or dangerous places, such as on ladders, roofs, or girders.  Do not drive unless your doctor says you may.  3. If you have any warning that you may have a seizure, lay down in a safe place where you can't hurt yourself.    4.  No driving for 6 months from last seizure, as per [place]  state law.   Please refer to the following link on the Epilepsy Foundation of [employer]'s website for more information: http://www.epilepsyfoundation.org/answerplace/Social/driving/drivingu.cfm   5.  Maintain good sleep hygiene. Avoid alcohol.  6.  Contact your doctor if you have any problems that may be related to the medicine you are taking.  7.  Call 911 and bring the patient back to the ED if:        A.  The seizure lasts longer than 5 minutes.       B.  The patient doesn't awaken shortly after the seizure  C.  The patient has new problems such as difficulty seeing, speaking or moving  D.  The patient was injured during the seizure  E.  The patient has a temperature over 102 F (39C)  F.  The patient vomited and now is having trouble breathing

## 2024-07-07 NOTE — Progress Notes (Signed)
 NEUROLOGY FOLLOW UP OFFICE NOTE  Wesley Mccarthy [id number] [birth year]  HISTORY OF PRESENT ILLNESS: I had the pleasure of seeing Wesley Mccarthy in follow-up in the neurology clinic on 07/07/2024.  The patient was last seen a year ago for seizures. He is alone in the office today. Records and images were personally reviewed where available. Since his last visit, he denies any seizures. He thinks his last seizure was in his 7s, records indicate when he was Dr. Jenel in 2013, it was reported he had some breakthrough seizures when he tried to taper himself off Phenobarbital . He has been on Phenobarbital  64.8mg  daily for most of his life, no side effects. He denies any staring/unresponsive episodes, gaps in time, olfactory/gustatory hallucinations, focal numbness/tingling/weakness, myoclonic jerks. No headaches, dizziness, vision changes, no falls. Sleep and mood are good.    History on Initial Assessment 07/08/2023: This is a 73 year old right-handed man with a history of hypertension, hyperlipidemia, CKD, esophageal obstruction, osteoarthritis, epilepsy, presenting to establish care for seizures. Records were reviewed, he had seen neurologist Dr. Rosemarie in 2017. Seizures started in the third grade. He thinks his last seizure was in his 68s, and states that they start with feeling nervous, he does not fall down or lose control of his body, he is able to talk and understand. He denies any history of convulsions or staring spells. Notes indicate that in the past he described seizures as losing body control for a few seconds, triggered by extreme excitement. He was initially on Dilantin and Phenobarbital , then Dilantin was discontinued. He has been on Phenobarbital  for most of his life. He states he is taking 1 tablet daily without side effects. There is report that when he saw Dr. Jenel in 2013 it was reported that he had some breakthrough seizures once he tried to taper himself off Phenobarbital . He had an EEG in  2017 that was normal. There is a head CT from 2008 with no acute changes.   He denies any staring/unresponsive episodes, gaps in time, olfactory/gustatory hallucinations, focal numbness/tingling/weakness, myoclonic jerks. No headaches, dizziness, diplopia, bowel/bladder dysfunction. He has some difficulty swallowing. He has arthritis pains in his neck and back. Yesterday he was at work (works part time at [employer]) and noticed a soft painless mass on his right elbow. No falls. He states he sleeps at night and in the daytime. He lives with his wife who has reported snoring. He states daytime drowsiness is retirement sleep and does not feel the need for a sleep study.   Epilepsy Risk Factors:  His daughter had one seizure when she was a few months old. He had a normal birth and early development.  There is no history of febrile convulsions, CNS infections such as meningitis/encephalitis, significant traumatic brain injury, neurosurgical procedures.  Prior ASMs: Dilantin Laboratory Data:  Lab Results  Component Value Date   WBC 5.3 02/23/2023   HGB 13.8 02/23/2023   HCT 44.4 02/23/2023   MCV 93.9 02/23/2023   PLT 190 02/23/2023     Chemistry      Component Value Date/Time   NA 137 02/23/2023 1753   NA 142 06/11/2017 1125   K 3.5 02/23/2023 1753   CL 101 02/23/2023 1753   CO2 25 02/23/2023 1753   BUN 19 02/23/2023 1753   BUN 21 06/11/2017 1125   CREATININE 1.96 (H) 02/23/2023 1753   CREATININE 1.53 (H) 05/13/2016 1753      Component Value Date/Time   CALCIUM  9.4 02/23/2023 1753  ALKPHOS 145 (H) 11/03/2022 0849   AST 22 11/03/2022 0849   ALT 18 11/03/2022 0849   BILITOT 0.4 11/03/2022 0849      PAST MEDICAL HISTORY: Past Medical History:  Diagnosis Date   Allergy    Arthritis    CKD (chronic kidney disease) stage 3, GFR 30-59 ml/min (HCC)    Epilepsy, unspecified, not intractable, without status epilepticus (HCC)    Esophageal obstruction    GERD (gastroesophageal  reflux disease)    Hyperlipidemia    Hypertension    Osteoarthritis 2018   knee   Seizures (HCC)     MEDICATIONS: Current Outpatient Medications on File Prior to Visit  Medication Sig Dispense Refill   albuterol  (VENTOLIN  HFA) 108 (90 Base) MCG/ACT inhaler Inhale 2 puffs into the lungs every 6 (six) hours as needed for wheezing or shortness of breath. 8 g 1   allopurinol  (ZYLOPRIM ) 100 MG tablet TAKE 1 TABLET BY MOUTH DAILY 90 tablet 0   amLODipine  (NORVASC ) 5 MG tablet TAKE 1 TABLET BY MOUTH DAILY 90 tablet 1   dapagliflozin propanediol (FARXIGA) 10 MG TABS tablet Take 10 mg by mouth daily.     omeprazole  (PRILOSEC) 20 MG capsule TAKE 1 CAPSULE BY MOUTH DAILY 90 capsule 3   PHENobarbital  (LUMINAL) 64.8 MG tablet Take 1 tablet (64.8 mg total) by mouth daily. 90 tablet 0   polyethylene glycol powder (GLYCOLAX /MIRALAX ) powder Take 17 g by mouth 2 (two) times daily as needed. 3350 g 1   Potassium Chloride  ER 20 MEQ TBCR Take 1 tablet by mouth daily.     rosuvastatin  (CRESTOR ) 20 MG tablet TAKE 1 TABLET BY MOUTH DAILY 90 tablet 3   valsartan -hydrochlorothiazide  (DIOVAN -HCT) 320-12.5 MG tablet TAKE 1 TABLET BY MOUTH DAILY 90 tablet 3   No current facility-administered medications on file prior to visit.    ALLERGIES: Allergies  Allergen Reactions   Norvasc  [Amlodipine  Besylate]     Per patient caused his liver to shutdown.   Penicillins     FAMILY HISTORY: Family History  Problem Relation Age of Onset   Heart disease Mother        Had a CABG   Colon cancer Father    Seizures Daughter    Diabetes Paternal Aunt    Stomach cancer Neg Hx    Esophageal cancer Neg Hx    Rectal cancer Neg Hx    Liver cancer Neg Hx     SOCIAL HISTORY: Social History   Socioeconomic History   Marital status: Married    Spouse name: Toy   Number of children: 3   Years of education: Not on file   Highest education level: Not on file  Occupational History   Occupation: Designer, jewellery    Occupation: [employer]   Occupation: [employer] RET[employer]  Tobacco Use   Smoking status: Never   Smokeless tobacco: Never  Vaping Use   Vaping status: Never Used  Substance and Sexual Activity   Alcohol use: Yes    Comment: 1-2 per year   Drug use: No   Sexual activity: Yes    Birth control/protection: None  Other Topics Concern   Not on file  Social History Narrative   Married. Education: [employer].    Right handed       Lives with wife   Social Drivers of Health   Financial Resource Strain: Low Risk  (02/01/2024)   Overall Financial Resource Strain (CARDIA)    Difficulty of Paying Living Expenses: Not hard at all  Food Insecurity: No Food Insecurity (02/01/2024)   Hunger Vital Sign    Worried About Running Out of Food in the Last Year: Never true    Ran Out of Food in the Last Year: Never true  Transportation Needs: No Transportation Needs (02/01/2024)   PRAPARE - Administrator, Civil Service (Medical): No    Lack of Transportation (Non-Medical): No  Physical Activity: Inactive (02/01/2024)   Exercise Vital Sign    Days of Exercise per Week: 0 days    Minutes of Exercise per Session: 0 min  Stress: No Stress Concern Present (02/01/2024)   [employer] of Occupational Health - Occupational Stress Questionnaire    Feeling of Stress : Not at all  Social Connections: Moderately Isolated (02/01/2024)   Social Connection and Isolation Panel    Frequency of Communication with Friends and Family: Three times a week    Frequency of Social Gatherings with Friends and Family: Never    Attends Religious Services: Never    Database administrator or Organizations: No    Attends Banker Meetings: Never    Marital Status: Married  Catering manager Violence: Not At Risk (02/01/2024)   Humiliation, Afraid, Rape, and Kick questionnaire    Fear of Current or Ex-Partner: No    Emotionally Abused: No    Physically Abused: No    Sexually Abused: No      PHYSICAL EXAM: Vitals:   07/07/24 0816  BP: 127/80  Pulse: 80  SpO2: 95%   General: No acute distress Head:  Normocephalic/atraumatic Skin/Extremities: No rash, no edema Neurological Exam: alert and awake. No aphasia or dysarthria. Fund of knowledge is appropriate.  Attention and concentration are normal.   Cranial nerves: Pupils equal, round. Extraocular movements intact with no nystagmus. Visual fields full.  No facial asymmetry.  Motor: Bulk and tone normal, muscle strength 5/5 throughout with no pronator drift.   Finger to nose testing intact.  Gait slow and cautious, wide-based, he states he is lazy to walk, no pain   IMPRESSION: This is a 73 yo RH man with a history of hypertension, hyperlipidemia, CKD, esophageal obstruction, osteoarthritis, and epilepsy since childhood. Semiology is nonspecific, he denies any staring or convulsive episodes. In the past he described seizures as losing body control for a few seconds, triggered by extreme excitement. He denies any seizures since his 6s, notes indicate breakthrough seizures in 2013 when he self-tapered Phenobarbital . EEG in 2017 normal. Continue Phenobarbital  64.8mg  daily, refills sent. We again discussed potential effects of long-term Phenobarbital  on bone health, he has not done bone density scan, we will reorder today. Check vitamin D level. He was encouraged to keep annual PCP visits. He is aware of [place] driving laws to stop driving after a seizure until 6 months seizure-free. Follow-up in 1 year, call for any changes.    Thank you for allowing me to participate in his care.  Please do not hesitate to call for any questions or concerns.    Darice Shivers, M.D.   CC: Dr. Sagardia

## 2024-07-07 NOTE — H&P (Signed)
 Chief Complaint: Patient was seen in consultation today for proteinuria  at the request of Singh,Vikas  Referring Physician(s): Singh,Vikas  Supervising Physician: Vanice Revel  Patient Status: [facility] - Out-pt  History of Present Illness: Wesley Mccarthy is a 73 y.o. male with PMHs of HTN, HLD, CKD stage III and persistent proteinuria who presents for random renal biopsy.  Patient has been followed by nephrology for CKD, labs showing persistent proteinuria. Random renal bx for further eval and management was recommended to the patient which he decided to proceed.   Patient laying in bed, not in acute distress.  Denise headache, fever, chills, shortness of breath, cough, chest pain, abdominal pain, nausea ,vomiting, and bleeding.   Past Medical History:  Diagnosis Date   Allergy    Arthritis    CKD (chronic kidney disease) stage 3, GFR 30-59 ml/min (HCC)    Epilepsy, unspecified, not intractable, without status epilepticus (HCC)    Esophageal obstruction    GERD (gastroesophageal reflux disease)    Hyperlipidemia    Hypertension    Osteoarthritis 2018   knee   Seizures (HCC)     Past Surgical History:  Procedure Laterality Date   APPENDECTOMY     TONSILLECTOMY     UPPER GASTROINTESTINAL ENDOSCOPY      Allergies: Norvasc  [amlodipine  besylate] and Penicillins  Medications: Prior to Admission medications   Medication Sig Start Date End Date Taking? Authorizing Provider  albuterol  (VENTOLIN  HFA) 108 (90 Base) MCG/ACT inhaler Inhale 2 puffs into the lungs every 6 (six) hours as needed for wheezing or shortness of breath. 05/25/24   Norleen Lynwood ORN, MD  allopurinol  (ZYLOPRIM ) 100 MG tablet TAKE 1 TABLET BY MOUTH DAILY 07/04/24   Purcell Emil Schanz, MD  amLODipine  (NORVASC ) 5 MG tablet TAKE 1 TABLET BY MOUTH DAILY 06/12/24   Purcell Emil Schanz, MD  dapagliflozin propanediol (FARXIGA) 10 MG TABS tablet Take 10 mg by mouth daily.    [provider]  omeprazole   (PRILOSEC) 20 MG capsule TAKE 1 CAPSULE BY MOUTH DAILY 04/19/24   Beather Delon Gibson, PA  PHENobarbital  (LUMINAL) 64.8 MG tablet Take 1 tablet (64.8 mg total) by mouth daily. 07/07/24   Georjean Darice HERO, MD  polyethylene glycol powder (GLYCOLAX /MIRALAX ) powder Take 17 g by mouth 2 (two) times daily as needed. 11/22/17   Stallings, Zoe A, MD  Potassium Chloride  ER 20 MEQ TBCR Take 1 tablet by mouth daily. 01/11/23   [provider]  rosuvastatin  (CRESTOR ) 20 MG tablet TAKE 1 TABLET BY MOUTH DAILY 12/15/23   Purcell Emil Schanz, MD  valsartan -hydrochlorothiazide  (DIOVAN -HCT) 320-12.5 MG tablet TAKE 1 TABLET BY MOUTH DAILY 01/20/24   Purcell Emil Schanz, MD     Family History  Problem Relation Age of Onset   Heart disease Mother        Had a CABG   Colon cancer Father    Seizures Daughter    Diabetes Paternal Aunt    Stomach cancer Neg Hx    Esophageal cancer Neg Hx    Rectal cancer Neg Hx    Liver cancer Neg Hx     Social History   Socioeconomic History   Marital status: Married    Spouse name: Toy   Number of children: 3   Years of education: Not on file   Highest education level: Not on file  Occupational History   Occupation: Designer, jewellery   Occupation: [employer]   Occupation: [employer] RET[employer]  Tobacco Use   Smoking status: Never  Smokeless tobacco: Never  Vaping Use   Vaping status: Never Used  Substance and Sexual Activity   Alcohol use: Yes    Comment: 1-2 per year   Drug use: No   Sexual activity: Yes    Birth control/protection: None  Other Topics Concern   Not on file  Social History Narrative   Married. Education: [employer].    Right handed       Lives with wife   Social Drivers of Health   Financial Resource Strain: Low Risk  (02/01/2024)   Overall Financial Resource Strain (CARDIA)    Difficulty of Paying Living Expenses: Not hard at all  Food Insecurity: No Food Insecurity (02/01/2024)   Hunger Vital Sign    Worried About Running Out  of Food in the Last Year: Never true    Ran Out of Food in the Last Year: Never true  Transportation Needs: No Transportation Needs (02/01/2024)   PRAPARE - Administrator, Civil Service (Medical): No    Lack of Transportation (Non-Medical): No  Physical Activity: Inactive (02/01/2024)   Exercise Vital Sign    Days of Exercise per Week: 0 days    Minutes of Exercise per Session: 0 min  Stress: No Stress Concern Present (02/01/2024)   [employer] of Occupational Health - Occupational Stress Questionnaire    Feeling of Stress : Not at all  Social Connections: Moderately Isolated (02/01/2024)   Social Connection and Isolation Panel    Frequency of Communication with Friends and Family: Three times a week    Frequency of Social Gatherings with Friends and Family: Never    Attends Religious Services: Never    Database administrator or Organizations: No    Attends Engineer, structural: Never    Marital Status: Married     Review of Systems: A 12 point ROS discussed and pertinent positives are indicated in the HPI above.  All other systems are negative.  Vital Signs: BP 132/77   Pulse 75   Temp 97.7 F (36.5 C) (Oral)   Resp 14   Ht 5' 10 (1.778 m)   Wt 236 lb (107 kg)   SpO2 95%   BMI 33.86 kg/m    Physical Exam Vitals and nursing note reviewed.  Constitutional:      General: Patient is not in acute distress.    Appearance: Normal appearance. Patient is not ill-appearing.  HENT:     Head: Normocephalic and atraumatic.     Mouth/Throat:     Mouth: Mucous membranes are moist.     Pharynx: Oropharynx is clear.  Cardiovascular:     Rate and Rhythm: Normal rate and regular rhythm.     Pulses: Normal pulses.     Heart sounds: Normal heart sounds.  Pulmonary:     Effort: Pulmonary effort is normal.     Breath sounds: Normal breath sounds.  Abdominal:     General: Abdomen is flat. Bowel sounds are normal.     Palpations: Abdomen is soft.   Musculoskeletal:     Cervical back: Neck supple.  Skin:    General: Skin is warm and dry.     Coloration: Skin is not jaundiced or pale.  Neurological:     Mental Status: Patient is alert and oriented to person, place, and time.  Psychiatric:        Mood and Affect: Mood normal.        Behavior: Behavior normal.  Judgment: Judgment normal.    MD Evaluation Airway: WNL Heart: WNL Abdomen: WNL Chest/ Lungs: WNL ASA  Classification: 2 Mallampati/Airway Score: Two  Imaging: No results found.  Labs:  CBC: Recent Labs    07/10/24 0650  WBC 5.1  HGB 12.0*  HCT 36.6*  PLT 183    COAGS: Recent Labs    07/10/24 0650  INR 1.0    BMP: No results for input(s): NA, K, CL, CO2, GLUCOSE, BUN, CALCIUM , CREATININE, GFRNONAA, GFRAA in the last 8760 hours.  Invalid input(s): CMP  LIVER FUNCTION TESTS: No results for input(s): BILITOT, AST, ALT, ALKPHOS, PROT, ALBUMIN in the last 8760 hours.  TUMOR MARKERS: No results for input(s): AFPTM, CEA, CA199, CHROMGRNA in the last 8760 hours.  Assessment and Plan: 73 y.o. male with CKD with proteinuria who presents for random renal bx.   NPO since MN VSS  CBC stable  INR: 1.0 today  Not on AC/AP  Risks and benefits of random renal bx was discussed with the patient and/or patient's family including, but not limited to bleeding, infection, damage to adjacent structures or low yield requiring additional tests.  All of the questions were answered and there is agreement to proceed.  Consent signed and in chart.   Thank you for this interesting consult.  I greatly enjoyed meeting Wesley Mccarthy and look forward to participating in their care.  A copy of this report was sent to the requesting provider on this date.  Electronically Signed: Toya VEAR Cousin, PA-C 07/10/2024, 8:18 AM   I spent a total of  30 Minutes   in face to face in clinical consultation, greater than 50% of which was  counseling/coordinating care for random renal bx.   This chart was dictated using voice recognition software.  Despite best efforts to proofread,  errors can occur which can change the documentation meaning.

## 2024-07-08 ENCOUNTER — Other Ambulatory Visit (HOSPITAL_COMMUNITY): Payer: Self-pay | Admitting: Student

## 2024-07-08 DIAGNOSIS — N1832 Chronic kidney disease, stage 3b: Secondary | ICD-10-CM

## 2024-07-10 ENCOUNTER — Other Ambulatory Visit: Payer: Self-pay

## 2024-07-10 ENCOUNTER — Ambulatory Visit (HOSPITAL_COMMUNITY)
Admission: RE | Admit: 2024-07-10 | Discharge: 2024-07-10 | Disposition: A | Discharge status: Home / Self Care | Source: Ambulatory Visit | Attending: Nephrology | Admitting: Nephrology

## 2024-07-10 ENCOUNTER — Encounter (HOSPITAL_COMMUNITY): Payer: Self-pay

## 2024-07-10 DIAGNOSIS — R809 Proteinuria, unspecified: Secondary | ICD-10-CM | POA: Insufficient documentation

## 2024-07-10 DIAGNOSIS — I129 Hypertensive chronic kidney disease with stage 1 through stage 4 chronic kidney disease, or unspecified chronic kidney disease: Secondary | ICD-10-CM | POA: Insufficient documentation

## 2024-07-10 DIAGNOSIS — E785 Hyperlipidemia, unspecified: Secondary | ICD-10-CM | POA: Insufficient documentation

## 2024-07-10 DIAGNOSIS — N1832 Chronic kidney disease, stage 3b: Secondary | ICD-10-CM | POA: Insufficient documentation

## 2024-07-10 LAB — CBC
HCT: 36.6 % — ABNORMAL LOW (ref 39.0–52.0)
Hemoglobin: 12 g/dL — ABNORMAL LOW (ref 13.0–17.0)
MCH: 29.7 pg (ref 26.0–34.0)
MCHC: 32.8 g/dL (ref 30.0–36.0)
MCV: 90.6 fL (ref 80.0–100.0)
Platelets: 183 K/uL (ref 150–400)
RBC: 4.04 MIL/uL — ABNORMAL LOW (ref 4.22–5.81)
RDW: 12.7 % (ref 11.5–15.5)
WBC: 5.1 K/uL (ref 4.0–10.5)
nRBC: 0 % (ref 0.0–0.2)

## 2024-07-10 LAB — PROTIME-INR
INR: 1 (ref 0.8–1.2)
Prothrombin Time: 13.6 s (ref 11.4–15.2)

## 2024-07-10 MED ORDER — FENTANYL CITRATE (PF) 100 MCG/2ML IJ SOLN
INTRAMUSCULAR | Status: AC
  Filled 2024-07-10: qty 2

## 2024-07-10 MED ORDER — LIDOCAINE HCL (PF) 1 % IJ SOLN
10.0000 mL | Freq: Once | INTRAMUSCULAR | Status: AC
Start: 2024-07-10 — End: 2024-07-10
  Administered 2024-07-10: 10 mL via INTRADERMAL

## 2024-07-10 MED ORDER — MIDAZOLAM HCL 2 MG/2ML IJ SOLN
INTRAMUSCULAR | Status: AC
  Filled 2024-07-10: qty 2

## 2024-07-10 MED ORDER — SODIUM CHLORIDE 0.9 % IV SOLN: INTRAVENOUS | Status: DC

## 2024-07-10 MED ORDER — MIDAZOLAM HCL 2 MG/2ML IJ SOLN
INTRAMUSCULAR | Status: AC | PRN
  Administered 2024-07-10: .5 mg via INTRAVENOUS
  Administered 2024-07-10: 1 mg via INTRAVENOUS
  Administered 2024-07-10: .5 mg via INTRAVENOUS

## 2024-07-10 MED ORDER — FENTANYL CITRATE (PF) 100 MCG/2ML IJ SOLN
INTRAMUSCULAR | Status: AC | PRN
  Administered 2024-07-10: 50 ug via INTRAVENOUS
  Administered 2024-07-10 (×2): 25 ug via INTRAVENOUS

## 2024-07-10 MED ORDER — GELATIN ABSORBABLE 12-7 MM EX MISC
1.0000 | Freq: Once | CUTANEOUS | Status: AC
  Administered 2024-07-10: 1 via TOPICAL

## 2024-07-10 NOTE — Procedures (Signed)
 Interventional Radiology Procedure Note  Procedure: US  RT RENAL RANDOM CORTEX CORE BX    Complications: None  Estimated Blood Loss:  MIN  Findings: 18 G CORE IN SALINE    Wesley FREDERIC SPECKING, MD

## 2024-07-12 LAB — VITAMIN D 1,25 DIHYDROXY
Vitamin D 1, 25 (OH)2 Total: 50 pg/mL (ref 18–72)
Vitamin D2 1, 25 (OH)2: <8 pg/mL
Vitamin D3 1, 25 (OH)2: 50 pg/mL

## 2024-07-17 ENCOUNTER — Encounter (HOSPITAL_COMMUNITY): Payer: Self-pay

## 2024-07-17 LAB — SURGICAL PATHOLOGY

## 2024-07-18 ENCOUNTER — Ambulatory Visit: Payer: Self-pay | Admitting: Neurology

## 2024-07-18 DIAGNOSIS — Z79899 Other long term (current) drug therapy: Secondary | ICD-10-CM

## 2024-07-19 ENCOUNTER — Other Ambulatory Visit: Payer: Self-pay

## 2024-07-19 NOTE — Telephone Encounter (Signed)
 Pt called and informed that vitamin D level is normal, proceed with bone density scan

## 2024-08-21 ENCOUNTER — Ambulatory Visit: Payer: Self-pay

## 2024-08-21 NOTE — Telephone Encounter (Signed)
 FYI Only or Action Required?: FYI only for provider: appointment scheduled on 08/22/24.  Patient was last seen in primary care on 05/25/2024 by Norleen Lynwood ORN, MD.  Called Nurse Triage reporting Foot Swelling.  Symptoms began about a month ago.  Interventions attempted: Rest, hydration, or home remedies.  Symptoms are: unchanged.  Triage Disposition: See PCP Within 2 Weeks  Patient/caregiver understands and will follow disposition?: Yes         Copied from CRM [id number]. Topic: Clinical - Red Word Triage >> Aug 21, 2024 11:14 AM Victoria A wrote: [facility] that prompted transfer to Nurse Triage: Patient's wife called patient has swelling in feet for a few weeks Reason for Disposition  [1] MILD swelling of both ankles (i.e., pedal edema) AND [2] is a chronic symptom (recurrent or ongoing AND present > 4 weeks)  Answer Assessment - Initial Assessment Questions 1. ONSET: When did the swelling start? (e.g., minutes, hours, days)     Worse x 1 month 2. LOCATION: What part of the leg is swollen?  Are both legs swollen or just one leg?     both 3. SEVERITY: How bad is the swelling? (e.g., localized; mild, moderate, severe)     Swelling to ankles - feels tight 4. REDNESS: Is there redness or signs of infection?     denies 5. PAIN: Is the swelling painful to touch? If Yes, ask: How painful is it?   (Scale 1-10; mild, moderate or severe)     Still able to walk 6. FEVER: Do you have a fever? If Yes, ask: What is it, how was it measured, and when did it start?      denies 7. CAUSE: What do you think is causing the leg swelling?     unknown 8. MEDICAL HISTORY: Do you have a history of blood clots (e.g., DVT), cancer, heart failure, kidney disease, or liver failure?     Endorses kidney issues 9. RECURRENT SYMPTOM: Have you had leg swelling before? If Yes, ask: When was the last time? What happened that time?     Seems to be a chronic issue that has worsened  in the past month 10. OTHER SYMPTOMS: Do you have any other symptoms? (e.g., chest pain, difficulty breathing)       Dyspnea with exertion 11. PREGNANCY: Is there any chance you are pregnant? When was your last menstrual period?       N/a  Protocols used: Leg Swelling and Edema-A-AH

## 2024-08-22 ENCOUNTER — Ambulatory Visit: Admitting: Emergency Medicine

## 2024-08-22 ENCOUNTER — Encounter: Payer: Self-pay | Admitting: Emergency Medicine

## 2024-08-22 VITALS — BP 128/84 | HR 71 | Temp 98.3°F | Ht 70.0 in | Wt 239.0 lb

## 2024-08-22 DIAGNOSIS — E1169 Type 2 diabetes mellitus with other specified complication: Secondary | ICD-10-CM | POA: Diagnosis not present

## 2024-08-22 DIAGNOSIS — E1159 Type 2 diabetes mellitus with other circulatory complications: Secondary | ICD-10-CM

## 2024-08-22 DIAGNOSIS — Z7984 Long term (current) use of oral hypoglycemic drugs: Secondary | ICD-10-CM

## 2024-08-22 DIAGNOSIS — E785 Hyperlipidemia, unspecified: Secondary | ICD-10-CM

## 2024-08-22 DIAGNOSIS — G40909 Epilepsy, unspecified, not intractable, without status epilepticus: Secondary | ICD-10-CM

## 2024-08-22 DIAGNOSIS — I152 Hypertension secondary to endocrine disorders: Secondary | ICD-10-CM | POA: Diagnosis not present

## 2024-08-22 DIAGNOSIS — R600 Localized edema: Secondary | ICD-10-CM

## 2024-08-22 DIAGNOSIS — N1832 Chronic kidney disease, stage 3b: Secondary | ICD-10-CM

## 2024-08-22 LAB — LIPID PANEL
Cholesterol: 141 mg/dL (ref 0–200)
HDL: 27.5 mg/dL — ABNORMAL LOW (ref >39.00)
NonHDL: 113.67
Total CHOL/HDL Ratio: 5
Triglycerides: 413 mg/dL — ABNORMAL HIGH (ref 0.0–149.0)
VLDL: 82.6 mg/dL — ABNORMAL HIGH (ref 0.0–40.0)

## 2024-08-22 LAB — COMPREHENSIVE METABOLIC PANEL WITH GFR
ALT: 20 U/L (ref 0–53)
AST: 25 U/L (ref 0–37)
Albumin: 4 g/dL (ref 3.5–5.2)
Alkaline Phosphatase: 142 U/L — ABNORMAL HIGH (ref 39–117)
BUN: 19 mg/dL (ref 6–23)
CO2: 28 meq/L (ref 19–32)
Calcium: 9.3 mg/dL (ref 8.4–10.5)
Chloride: 101 meq/L (ref 96–112)
Creatinine, Ser: 2.03 mg/dL — ABNORMAL HIGH (ref 0.40–1.50)
GFR: 31.87 mL/min — ABNORMAL LOW (ref >60.00)
Glucose, Bld: 92 mg/dL (ref 70–99)
Potassium: 3.5 meq/L (ref 3.5–5.1)
Sodium: 138 meq/L (ref 135–145)
Total Bilirubin: 0.5 mg/dL (ref 0.2–1.2)
Total Protein: 7.4 g/dL (ref 6.0–8.3)

## 2024-08-22 LAB — LDL CHOLESTEROL, DIRECT: Direct LDL: 59 mg/dL

## 2024-08-22 MED ORDER — OZEMPIC (0.25 OR 0.5 MG/DOSE) 2 MG/3ML ~~LOC~~ SOPN: 0.2500 mg | PEN_INJECTOR | SUBCUTANEOUS | 2 refills | Status: AC

## 2024-08-22 NOTE — Assessment & Plan Note (Signed)
Stable.  Continues on phenobarbital 64.8 mg 1-1/2 tablets at bedtime

## 2024-08-22 NOTE — Assessment & Plan Note (Signed)
 Chronic stable conditions Continue Farxiga 10 mg and start weekly Ozempic Continue rosuvastatin  20 mg daily Diet and nutrition discussed

## 2024-08-22 NOTE — Progress Notes (Signed)
 Wesley Mccarthy 73 y.o.   Chief Complaint  Patient presents with   Foot Swelling    Ankle and feet swelling in both pt states that this has been going on for awhile now.    HISTORY OF PRESENT ILLNESS: This is a 73 y.o. male complaining of bilateral lower leg edema for the last several weeks Also inquiring about GLP-1 agonist.  Wants to get started on them as recommended by kidney doctor. Has no other complaints or medical concerns today.  HPI   Prior to Admission medications   Medication Sig Start Date End Date Taking? Authorizing Provider  albuterol  (VENTOLIN  HFA) 108 (90 Base) MCG/ACT inhaler Inhale 2 puffs into the lungs every 6 (six) hours as needed for wheezing or shortness of breath. 05/25/24  Yes Norleen Lynwood ORN, MD  allopurinol  (ZYLOPRIM ) 100 MG tablet TAKE 1 TABLET BY MOUTH DAILY 07/04/24  Yes Titus Drone, Emil Schanz, MD  amLODipine  (NORVASC ) 5 MG tablet TAKE 1 TABLET BY MOUTH DAILY 06/12/24  Yes Caniya Tagle, Emil Schanz, MD  dapagliflozin propanediol (FARXIGA) 10 MG TABS tablet Take 10 mg by mouth daily.   Yes [provider]  omeprazole  (PRILOSEC) 20 MG capsule TAKE 1 CAPSULE BY MOUTH DAILY 04/19/24  Yes Beather Delon Gibson, PA  PHENobarbital  (LUMINAL) 64.8 MG tablet Take 1 tablet (64.8 mg total) by mouth daily. 07/07/24  Yes Georjean Darice HERO, MD  polyethylene glycol powder (GLYCOLAX /MIRALAX ) powder Take 17 g by mouth 2 (two) times daily as needed. 11/22/17  Yes Stallings, Zoe A, MD  Potassium Chloride  ER 20 MEQ TBCR Take 1 tablet by mouth daily. 01/11/23  Yes [provider]  rosuvastatin  (CRESTOR ) 20 MG tablet TAKE 1 TABLET BY MOUTH DAILY 12/15/23  Yes Dorris Pierre, Emil Schanz, MD  Semaglutide ,0.25 or 0.5MG /DOS, (OZEMPIC, 0.25 OR 0.5 MG/DOSE,) 2 MG/3ML SOPN Inject 0.25 mg into the skin once a week. Increase to 0.5 mg weekly after 2 weeks if side effects tolerated 08/22/24  Yes Zamira Hickam, Emil Schanz, MD  valsartan -hydrochlorothiazide  (DIOVAN -HCT) 320-12.5 MG tablet TAKE 1 TABLET  BY MOUTH DAILY 01/20/24  Yes Disney Ruggiero, Emil Schanz, MD    Allergies  Allergen Reactions   Norvasc  [Amlodipine  Besylate]     Per patient caused his liver to shutdown.   Penicillins     Patient Active Problem List   Diagnosis Date Noted   Dyslipidemia associated with type 2 diabetes mellitus (HCC) 08/22/2024   Peripheral edema 08/22/2024   Cough 05/25/2024   Wheezing 05/25/2024   Olecranon bursitis, right elbow 07/12/2023   Elbow effusion, right 07/08/2023   Need for vaccination 07/08/2023   At risk for cardiovascular event 06/23/2023   Esophageal dysphagia 02/24/2023   Spondylosis of lumbar region without myelopathy or radiculopathy 02/08/2023   CKD (chronic kidney disease) stage 3, GFR 30-59 ml/min (HCC) 11/03/2022   Hypertensive kidney disease 11/03/2022   Anemia 08/22/2013   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   Stasis dermatitis of both legs 12/21/2012   Edema 12/21/2012   Seizure disorder (HCC) 08/26/2012   Hypertension associated with diabetes (HCC) 11/11/2011   Dyslipidemia 11/11/2011   Chronic gout without tophus 11/11/2011   Morbid obesity (HCC) 11/11/2011    Past Medical History:  Diagnosis Date   Allergy    Arthritis    CKD (chronic kidney disease) stage 3, GFR 30-59 ml/min (HCC)    Epilepsy, unspecified, not intractable, without status epilepticus (HCC)    Esophageal obstruction    GERD (gastroesophageal reflux disease)    Hyperlipidemia    Hypertension    Osteoarthritis  2018   knee   Seizures (HCC)     Past Surgical History:  Procedure Laterality Date   APPENDECTOMY     TONSILLECTOMY     UPPER GASTROINTESTINAL ENDOSCOPY      Social History   Socioeconomic History   Marital status: Married    Spouse name: Toy   Number of children: 3   Years of education: Not on file   Highest education level: 12th grade  Occupational History   Occupation: Designer, Jewellery   Occupation: [employer]   Occupation: [employer] RET[employer]  Tobacco Use   Smoking status:  Never   Smokeless tobacco: Never  Vaping Use   Vaping status: Never Used  Substance and Sexual Activity   Alcohol use: Yes    Comment: 1-2 per year   Drug use: No   Sexual activity: Yes    Birth control/protection: None  Other Topics Concern   Not on file  Social History Narrative   Married. Education: [employer].    Right handed       Lives with wife   Social Drivers of Health   Financial Resource Strain: Low Risk  (08/21/2024)   Overall Financial Resource Strain (CARDIA)    Difficulty of Paying Living Expenses: Not hard at all  Food Insecurity: No Food Insecurity (08/21/2024)   Hunger Vital Sign    Worried About Running Out of Food in the Last Year: Never true    Ran Out of Food in the Last Year: Never true  Transportation Needs: No Transportation Needs (08/21/2024)   PRAPARE - Administrator, Civil Service (Medical): No    Lack of Transportation (Non-Medical): No  Physical Activity: Insufficiently Active (08/21/2024)   Exercise Vital Sign    Days of Exercise per Week: 1 day    Minutes of Exercise per Session: 10 min  Stress: No Stress Concern Present (08/21/2024)   [employer] of Occupational Health - Occupational Stress Questionnaire    Feeling of Stress: Only a little  Social Connections: Moderately Isolated (08/21/2024)   Social Connection and Isolation Panel    Frequency of Communication with Friends and Family: More than three times a week    Frequency of Social Gatherings with Friends and Family: Once a week    Attends Religious Services: Patient declined    Database Administrator or Organizations: No    Attends Engineer, Structural: Not on file    Marital Status: Married  Catering Manager Violence: Not At Risk (02/01/2024)   Humiliation, Afraid, Rape, and Kick questionnaire    Fear of Current or Ex-Partner: No    Emotionally Abused: No    Physically Abused: No    Sexually Abused: No    Family History  Problem Relation Age of Onset    Heart disease Mother        Had a CABG   Colon cancer Father    Seizures Daughter    Diabetes Paternal Aunt    Stomach cancer Neg Hx    Esophageal cancer Neg Hx    Rectal cancer Neg Hx    Liver cancer Neg Hx      Review of Systems  Constitutional: Negative.  Negative for chills and fever.  HENT: Negative.  Negative for congestion and sore throat.   Respiratory: Negative.  Negative for cough and shortness of breath.   Cardiovascular:  Positive for leg swelling. Negative for chest pain and palpitations.  Gastrointestinal:  Negative for abdominal pain, diarrhea, nausea and vomiting.  Genitourinary: Negative.  Negative for dysuria and hematuria.  Skin: Negative.  Negative for rash.  Neurological: Negative.  Negative for dizziness and headaches.  All other systems reviewed and are negative.   Vitals:   08/22/24 1508  BP: 128/84  Pulse: 71  Temp: 98.3 F (36.8 C)  SpO2: 98%    Physical Exam Vitals reviewed.  Constitutional:      Appearance: Normal appearance.  HENT:     Head: Normocephalic.     Mouth/Throat:     Mouth: Mucous membranes are moist.     Pharynx: Oropharynx is clear.  Eyes:     Extraocular Movements: Extraocular movements intact.     Pupils: Pupils are equal, round, and reactive to light.  Cardiovascular:     Rate and Rhythm: Normal rate and regular rhythm.     Pulses: Normal pulses.     Heart sounds: Normal heart sounds.  Pulmonary:     Effort: Pulmonary effort is normal.     Breath sounds: Normal breath sounds.  Abdominal:     Palpations: Abdomen is soft.     Tenderness: There is no abdominal tenderness.  Musculoskeletal:     Cervical back: No tenderness.     Right lower leg: Edema present.     Left lower leg: Edema present.  Lymphadenopathy:     Cervical: No cervical adenopathy.  Skin:    General: Skin is warm and dry.     Capillary Refill: Capillary refill takes less than 2 seconds.  Neurological:     General: No focal deficit present.      Mental Status: He is alert and oriented to person, place, and time.  Psychiatric:        Mood and Affect: Mood normal.        Behavior: Behavior normal.      ASSESSMENT & PLAN: A total of 42 minutes was spent with the patient and counseling/coordination of care regarding preparing for this visit, review of most recent office visit notes, review of multiple chronic medical conditions and their management, cardiovascular risks associated with hypertension and diabetes, management of peripheral edema, review of all medications and changes made, review of most recent bloodwork results, review of health maintenance items, education on nutrition, prognosis, documentation, and need for follow up.   Problem List Items Addressed This Visit       Cardiovascular and Mediastinum   Hypertension associated with diabetes (HCC) - Primary   BP Readings from Last 3 Encounters:  08/22/24 128/84  07/10/24 128/79  07/07/24 127/80   Lab Results  Component Value Date   HGBA1C 6.6 (H) 11/03/2022  Well-controlled hypertension Continue valsartan  HCT 320-12.5 mg daily and amlodipine  5 mg daily Amlodipine  contributing to peripheral edema Diet and nutrition discussed Hemoglobin A1c done today Recommend to start Ozempic Continue Farxiga 10 mg Blood work today and follow-up in 3 months        Relevant Medications   Semaglutide ,0.25 or 0.5MG /DOS, (OZEMPIC, 0.25 OR 0.5 MG/DOSE,) 2 MG/3ML SOPN   Other Relevant Orders   Comprehensive metabolic panel with GFR   CBC with Differential/Platelet   Hemoglobin A1c   Lipid panel   Microalbumin / creatinine urine ratio     Endocrine   Dyslipidemia associated with type 2 diabetes mellitus (HCC)   Chronic stable conditions Continue Farxiga 10 mg and start weekly Ozempic Continue rosuvastatin  20 mg daily Diet and nutrition discussed      Relevant Medications   Semaglutide ,0.25 or 0.5MG /DOS, (OZEMPIC, 0.25 OR 0.5 MG/DOSE,) 2  MG/3ML SOPN   Other Relevant  Orders   Comprehensive metabolic panel with GFR   CBC with Differential/Platelet   Hemoglobin A1c   Lipid panel   Microalbumin / creatinine urine ratio     Nervous and Auditory   Seizure disorder (HCC)   Stable.  Continues on phenobarbital  64.8 mg 1-1/2 tablets at bedtime        Genitourinary   CKD (chronic kidney disease) stage 3, GFR 30-59 ml/min (HCC)   Chronic stable condition Advised to stay well-hydrated and avoid NSAIDs Continue Farxiga 10 mg daily Recommend blood work and urine testing today        Other   Morbid obesity (HCC)   Wt Readings from Last 3 Encounters:  08/22/24 239 lb (108.4 kg)  07/10/24 236 lb (107 kg)  07/07/24 236 lb (107 kg)  Diet and nutrition discussed Advised to decrease amount of daily carbohydrate intake and daily calories and increase amount of plant-based protein in his diet Will benefit from GLP-1 agonist       Relevant Medications   Semaglutide ,0.25 or 0.5MG /DOS, (OZEMPIC, 0.25 OR 0.5 MG/DOSE,) 2 MG/3ML SOPN   Other Relevant Orders   Comprehensive metabolic panel with GFR   CBC with Differential/Platelet   Hemoglobin A1c   Lipid panel   Microalbumin / creatinine urine ratio   Peripheral edema   Multiple factors contributing Diet and nutrition discussed Amlodipine  medication contributing Advised to keep legs elevated when at home not doing anything Compression socks recommended Low-sodium diet recommended May benefit from furosemide .  Not recommended yet       Patient Instructions  Peripheral Edema  Peripheral edema is swelling that is caused by a buildup of fluid. Peripheral edema most often affects the lower legs, ankles, and feet. It can also develop in the arms, hands, and face. The area of the body that has peripheral edema will look swollen. It may also feel heavy or warm. Your clothes may start to feel tight. Pressing on the area may make a temporary dent in your skin (pitting edema). You may not be able to move your  swollen arm or leg as much as usual. There are many causes of peripheral edema. It can happen because of a complication of other conditions such as heart failure, kidney disease, or a problem with your circulation. It also can be a side effect of certain medicines or happen because of an infection. It often happens to women during pregnancy. Sometimes, the cause is not known. Follow these instructions at home: Managing pain, stiffness, and swelling  Raise (elevate) your legs while you are sitting or lying down. Move around often to prevent stiffness and to reduce swelling. Do not sit or stand for long periods of time. Do not wear tight clothing. Do not wear garters on your upper legs. Exercise your legs to get your circulation going. This helps to move the fluid back into your blood vessels, and it may help the swelling go down. Wear compression stockings as told by your health care provider. These stockings help to prevent blood clots and reduce swelling in your legs. It is important that these are the correct size. These stockings should be prescribed by your doctor to prevent possible injuries. If elastic bandages or wraps are recommended, use them as told by your health care provider. Medicines Take over-the-counter and prescription medicines only as told by your health care provider. Your health care provider may prescribe medicine to help your body get rid of excess water (diuretic).  Take this medicine if you are told to take it. General instructions Eat a low-salt (low-sodium) diet as told by your health care provider. Sometimes, eating less salt may reduce swelling. Pay attention to any changes in your symptoms. Moisturize your skin daily to help prevent skin from cracking and draining. Keep all follow-up visits. This is important. Contact a health care provider if: You have a fever. You have swelling in only one leg. You have increased swelling, redness, or pain in one or both of your  legs. You have drainage or sores at the area where you have edema. Get help right away if: You have edema that starts suddenly or is getting worse, especially if you are pregnant or have a medical condition. You develop shortness of breath, especially when you are lying down. You have pain in your chest or abdomen. You feel weak. You feel like you will faint. These symptoms may be an emergency. Get help right away. Call 911. Do not wait to see if the symptoms will go away. Do not drive yourself to the hospital. Summary Peripheral edema is swelling that is caused by a buildup of fluid. Peripheral edema most often affects the lower legs, ankles, and feet. Move around often to prevent stiffness and to reduce swelling. Do not sit or stand for long periods of time. Pay attention to any changes in your symptoms. Contact a health care provider if you have edema that starts suddenly or is getting worse, especially if you are pregnant or have a medical condition. Get help right away if you develop shortness of breath, especially when lying down. This information is not intended to replace advice given to you by your health care provider. Make sure you discuss any questions you have with your health care provider. Document Revised: 05/26/2021 Document Reviewed: 05/26/2021 Elsevier Patient Education  2024 Elsevier Inc.     Emil Schaumann, MD [facility]

## 2024-08-22 NOTE — Patient Instructions (Signed)
 Peripheral Edema  Peripheral edema is swelling that is caused by a buildup of fluid. Peripheral edema most often affects the lower legs, ankles, and feet. It can also develop in the arms, hands, and face. The area of the body that has peripheral edema will look swollen. It may also feel heavy or warm. Your clothes may start to feel tight. Pressing on the area may make a temporary dent in your skin (pitting edema). You may not be able to move your swollen arm or leg as much as usual. There are many causes of peripheral edema. It can happen because of a complication of other conditions such as heart failure, kidney disease, or a problem with your circulation. It also can be a side effect of certain medicines or happen because of an infection. It often happens to women during pregnancy. Sometimes, the cause is not known. Follow these instructions at home: Managing pain, stiffness, and swelling  Raise (elevate) your legs while you are sitting or lying down. Move around often to prevent stiffness and to reduce swelling. Do not sit or stand for long periods of time. Do not wear tight clothing. Do not wear garters on your upper legs. Exercise your legs to get your circulation going. This helps to move the fluid back into your blood vessels, and it may help the swelling go down. Wear compression stockings as told by your health care provider. These stockings help to prevent blood clots and reduce swelling in your legs. It is important that these are the correct size. These stockings should be prescribed by your doctor to prevent possible injuries. If elastic bandages or wraps are recommended, use them as told by your health care provider. Medicines Take over-the-counter and prescription medicines only as told by your health care provider. Your health care provider may prescribe medicine to help your body get rid of excess water (diuretic). Take this medicine if you are told to take it. General  instructions Eat a low-salt (low-sodium) diet as told by your health care provider. Sometimes, eating less salt may reduce swelling. Pay attention to any changes in your symptoms. Moisturize your skin daily to help prevent skin from cracking and draining. Keep all follow-up visits. This is important. Contact a health care provider if: You have a fever. You have swelling in only one leg. You have increased swelling, redness, or pain in one or both of your legs. You have drainage or sores at the area where you have edema. Get help right away if: You have edema that starts suddenly or is getting worse, especially if you are pregnant or have a medical condition. You develop shortness of breath, especially when you are lying down. You have pain in your chest or abdomen. You feel weak. You feel like you will faint. These symptoms may be an emergency. Get help right away. Call 911. Do not wait to see if the symptoms will go away. Do not drive yourself to the hospital. Summary Peripheral edema is swelling that is caused by a buildup of fluid. Peripheral edema most often affects the lower legs, ankles, and feet. Move around often to prevent stiffness and to reduce swelling. Do not sit or stand for long periods of time. Pay attention to any changes in your symptoms. Contact a health care provider if you have edema that starts suddenly or is getting worse, especially if you are pregnant or have a medical condition. Get help right away if you develop shortness of breath, especially when lying down.  This information is not intended to replace advice given to you by your health care provider. Make sure you discuss any questions you have with your health care provider. Document Revised: 05/26/2021 Document Reviewed: 05/26/2021 Elsevier Patient Education  2024 [employer].

## 2024-08-22 NOTE — Assessment & Plan Note (Signed)
 Wt Readings from Last 3 Encounters:  08/22/24 239 lb (108.4 kg)  07/10/24 236 lb (107 kg)  07/07/24 236 lb (107 kg)  Diet and nutrition discussed Advised to decrease amount of daily carbohydrate intake and daily calories and increase amount of plant-based protein in his diet Will benefit from GLP-1 agonist

## 2024-08-22 NOTE — Assessment & Plan Note (Signed)
 Chronic stable condition Advised to stay well-hydrated and avoid NSAIDs Continue Farxiga 10 mg daily Recommend blood work and urine testing today

## 2024-08-22 NOTE — Assessment & Plan Note (Signed)
 Multiple factors contributing Diet and nutrition discussed Amlodipine  medication contributing Advised to keep legs elevated when at home not doing anything Compression socks recommended Low-sodium diet recommended May benefit from furosemide .  Not recommended yet

## 2024-08-22 NOTE — Assessment & Plan Note (Signed)
 BP Readings from Last 3 Encounters:  08/22/24 128/84  07/10/24 128/79  07/07/24 127/80   Lab Results  Component Value Date   HGBA1C 6.6 (H) 11/03/2022  Well-controlled hypertension Continue valsartan  HCT 320-12.5 mg daily and amlodipine  5 mg daily Amlodipine  contributing to peripheral edema Diet and nutrition discussed Hemoglobin A1c done today Recommend to start Ozempic Continue Farxiga 10 mg Blood work today and follow-up in 3 months

## 2024-08-23 ENCOUNTER — Ambulatory Visit: Payer: Self-pay | Admitting: Emergency Medicine

## 2024-08-23 LAB — MICROALBUMIN / CREATININE URINE RATIO
Creatinine,U: 85.1 mg/dL
Microalb Creat Ratio: 1516.1 mg/g — ABNORMAL HIGH (ref 0.0–30.0)
Microalb, Ur: 129 mg/dL — ABNORMAL HIGH (ref 0.0–1.9)

## 2024-08-23 LAB — HEMOGLOBIN A1C: Hgb A1c MFr Bld: 6.5 % (ref 4.6–6.5)

## 2024-08-23 LAB — CBC WITH DIFFERENTIAL/PLATELET
Basophils Absolute: 0 K/uL (ref 0.0–0.1)
Basophils Relative: 1.1 % (ref 0.0–3.0)
Eosinophils Absolute: 0.3 K/uL (ref 0.0–0.7)
Eosinophils Relative: 6.7 % — ABNORMAL HIGH (ref 0.0–5.0)
HCT: 38.4 % — ABNORMAL LOW (ref 39.0–52.0)
Hemoglobin: 12.5 g/dL — ABNORMAL LOW (ref 13.0–17.0)
Lymphocytes Relative: 23.4 % (ref 12.0–46.0)
Lymphs Abs: 1.1 K/uL (ref 0.7–4.0)
MCHC: 32.7 g/dL (ref 30.0–36.0)
MCV: 89.2 fl (ref 78.0–100.0)
Monocytes Absolute: 0.5 K/uL (ref 0.1–1.0)
Monocytes Relative: 9.9 % (ref 3.0–12.0)
Neutro Abs: 2.7 K/uL (ref 1.4–7.7)
Neutrophils Relative %: 58.9 % (ref 43.0–77.0)
Platelets: 230 K/uL (ref 150.0–400.0)
RBC: 4.3 Mil/uL (ref 4.22–5.81)
RDW: 13.8 % (ref 11.5–15.5)
WBC: 4.6 K/uL (ref 4.0–10.5)

## 2024-08-24 ENCOUNTER — Other Ambulatory Visit (HOSPITAL_COMMUNITY): Payer: Self-pay

## 2024-08-24 ENCOUNTER — Telehealth: Payer: Self-pay

## 2024-08-24 NOTE — Telephone Encounter (Signed)
 Pharmacy Patient Advocate Encounter  Received notification from [employer] that Prior Authorization for Ozempic  2 has been APPROVED from 08/24/24 to 10/04/25. Ran test claim, Copay is $553.04 due to deductible. This test claim was processed through [facility]- copay amounts may vary at other pharmacies due to pharmacy/plan contracts, or as the patient moves through the different stages of their insurance plan.   PA #/Case ID/Reference #:  # [id number]

## 2024-08-24 NOTE — Telephone Encounter (Signed)
 Pharmacy Patient Advocate Encounter   Received notification from Onbase that prior authorization for Ozempic  2 is required/requested.   Insurance verification completed.   The patient is insured through [facility].   Per test claim: PA required; PA submitted to above mentioned insurance via Latent Key/confirmation #/EOC [id number] Status is pending

## 2024-08-25 NOTE — Telephone Encounter (Signed)
 Called and informed patient of this patient verbalized he understood

## 2024-08-28 ENCOUNTER — Encounter: Payer: Self-pay | Admitting: Neurology

## 2024-10-06 ENCOUNTER — Other Ambulatory Visit: Payer: Self-pay | Admitting: Emergency Medicine

## 2024-10-06 DIAGNOSIS — M1A9XX0 Chronic gout, unspecified, without tophus (tophi): Secondary | ICD-10-CM

## 2025-02-02 ENCOUNTER — Ambulatory Visit

## 2025-07-09 ENCOUNTER — Ambulatory Visit: Admitting: Neurology
